# Patient Record
Sex: Male | Born: 1935 | Race: White | Hispanic: No | Marital: Married | Smoking: Former smoker
Health system: Southern US, Community
[De-identification: ages and names within clinical notes are randomized; demographics above are authoritative.]

## PROBLEM LIST (undated history)

## (undated) DIAGNOSIS — I219 Acute myocardial infarction, unspecified: Secondary | ICD-10-CM

## (undated) DIAGNOSIS — C8590 Non-Hodgkin lymphoma, unspecified, unspecified site: Secondary | ICD-10-CM

## (undated) DIAGNOSIS — IMO0001 Reserved for inherently not codable concepts without codable children: Secondary | ICD-10-CM

## (undated) DIAGNOSIS — E7800 Pure hypercholesterolemia, unspecified: Secondary | ICD-10-CM

## (undated) DIAGNOSIS — E785 Hyperlipidemia, unspecified: Secondary | ICD-10-CM

## (undated) DIAGNOSIS — I2510 Atherosclerotic heart disease of native coronary artery without angina pectoris: Secondary | ICD-10-CM

## (undated) DIAGNOSIS — C44300 Unspecified malignant neoplasm of skin of unspecified part of face: Secondary | ICD-10-CM

## (undated) HISTORY — DX: Atherosclerotic heart disease of native coronary artery without angina pectoris: I25.10

## (undated) HISTORY — DX: Pure hypercholesterolemia, unspecified: E78.00

## (undated) HISTORY — PX: HERNIA REPAIR: SHX51

## (undated) HISTORY — DX: Reserved for inherently not codable concepts without codable children: IMO0001

## (undated) HISTORY — DX: Hyperlipidemia, unspecified: E78.5

## (undated) HISTORY — DX: Non-Hodgkin lymphoma, unspecified, unspecified site: C85.90

---

## 1998-10-24 ENCOUNTER — Other Ambulatory Visit: Admission: RE | Admit: 1998-10-24 | Discharge: 1998-10-24 | Payer: Self-pay | Admitting: Otolaryngology

## 1998-11-23 ENCOUNTER — Ambulatory Visit (HOSPITAL_BASED_OUTPATIENT_CLINIC_OR_DEPARTMENT_OTHER): Admission: RE | Admit: 1998-11-23 | Discharge: 1998-11-23 | Payer: Self-pay | Admitting: Otolaryngology

## 1998-12-29 ENCOUNTER — Other Ambulatory Visit: Admission: RE | Admit: 1998-12-29 | Discharge: 1998-12-29 | Payer: Self-pay | Admitting: Oncology

## 1999-01-11 ENCOUNTER — Ambulatory Visit (HOSPITAL_COMMUNITY): Admission: RE | Admit: 1999-01-11 | Discharge: 1999-01-11 | Payer: Self-pay | Admitting: Oncology

## 1999-01-11 ENCOUNTER — Encounter: Payer: Self-pay | Admitting: Oncology

## 1999-04-03 ENCOUNTER — Encounter: Payer: Self-pay | Admitting: Oncology

## 1999-04-03 ENCOUNTER — Encounter: Admission: RE | Admit: 1999-04-03 | Discharge: 1999-04-03 | Payer: Self-pay | Admitting: Oncology

## 1999-05-18 ENCOUNTER — Encounter: Admission: RE | Admit: 1999-05-18 | Discharge: 1999-05-18 | Payer: Self-pay | Admitting: Oncology

## 1999-05-18 ENCOUNTER — Encounter: Payer: Self-pay | Admitting: Oncology

## 2000-09-16 ENCOUNTER — Encounter (INDEPENDENT_AMBULATORY_CARE_PROVIDER_SITE_OTHER): Payer: Self-pay | Admitting: Specialist

## 2000-09-16 ENCOUNTER — Ambulatory Visit (HOSPITAL_BASED_OUTPATIENT_CLINIC_OR_DEPARTMENT_OTHER): Admission: RE | Admit: 2000-09-16 | Discharge: 2000-09-16 | Payer: Self-pay | Admitting: Otolaryngology

## 2000-09-23 ENCOUNTER — Encounter: Admission: RE | Admit: 2000-09-23 | Discharge: 2000-09-23 | Payer: Self-pay | Admitting: Oncology

## 2000-09-23 ENCOUNTER — Encounter: Payer: Self-pay | Admitting: Oncology

## 2000-12-01 ENCOUNTER — Encounter: Payer: Self-pay | Admitting: Oncology

## 2000-12-01 ENCOUNTER — Ambulatory Visit (HOSPITAL_COMMUNITY): Admission: RE | Admit: 2000-12-01 | Discharge: 2000-12-01 | Payer: Self-pay | Admitting: Oncology

## 2000-12-08 ENCOUNTER — Encounter: Payer: Self-pay | Admitting: Oncology

## 2000-12-08 ENCOUNTER — Ambulatory Visit (HOSPITAL_COMMUNITY): Admission: RE | Admit: 2000-12-08 | Discharge: 2000-12-08 | Payer: Self-pay | Admitting: Oncology

## 2001-01-26 ENCOUNTER — Encounter: Payer: Self-pay | Admitting: Oncology

## 2001-01-26 ENCOUNTER — Encounter: Admission: RE | Admit: 2001-01-26 | Discharge: 2001-01-26 | Payer: Self-pay | Admitting: Oncology

## 2001-01-30 ENCOUNTER — Ambulatory Visit (HOSPITAL_COMMUNITY): Admission: RE | Admit: 2001-01-30 | Discharge: 2001-01-30 | Payer: Self-pay | Admitting: Oncology

## 2001-01-30 ENCOUNTER — Encounter: Payer: Self-pay | Admitting: Oncology

## 2001-03-30 ENCOUNTER — Encounter: Payer: Self-pay | Admitting: Oncology

## 2001-03-30 ENCOUNTER — Encounter: Admission: RE | Admit: 2001-03-30 | Discharge: 2001-03-30 | Payer: Self-pay | Admitting: Oncology

## 2001-04-10 ENCOUNTER — Encounter: Payer: Self-pay | Admitting: Hematology and Oncology

## 2001-04-10 ENCOUNTER — Ambulatory Visit (HOSPITAL_COMMUNITY): Admission: RE | Admit: 2001-04-10 | Discharge: 2001-04-10 | Payer: Self-pay | Admitting: Hematology and Oncology

## 2001-05-13 DIAGNOSIS — C8590 Non-Hodgkin lymphoma, unspecified, unspecified site: Secondary | ICD-10-CM

## 2001-05-13 HISTORY — DX: Non-Hodgkin lymphoma, unspecified, unspecified site: C85.90

## 2001-11-23 ENCOUNTER — Encounter: Payer: Self-pay | Admitting: Cardiology

## 2001-11-23 ENCOUNTER — Inpatient Hospital Stay (HOSPITAL_COMMUNITY): Admission: EM | Admit: 2001-11-23 | Discharge: 2001-11-27 | Payer: Self-pay | Admitting: Emergency Medicine

## 2002-06-30 ENCOUNTER — Ambulatory Visit (HOSPITAL_COMMUNITY): Admission: RE | Admit: 2002-06-30 | Discharge: 2002-06-30 | Payer: Self-pay | Admitting: Cardiology

## 2002-12-31 ENCOUNTER — Encounter: Payer: Self-pay | Admitting: Oncology

## 2002-12-31 ENCOUNTER — Ambulatory Visit (HOSPITAL_COMMUNITY): Admission: RE | Admit: 2002-12-31 | Discharge: 2002-12-31 | Payer: Self-pay | Admitting: Oncology

## 2004-04-19 ENCOUNTER — Ambulatory Visit: Payer: Self-pay | Admitting: Oncology

## 2004-10-29 ENCOUNTER — Ambulatory Visit: Payer: Self-pay | Admitting: Oncology

## 2005-04-29 ENCOUNTER — Ambulatory Visit: Payer: Self-pay | Admitting: Oncology

## 2005-05-13 DIAGNOSIS — I219 Acute myocardial infarction, unspecified: Secondary | ICD-10-CM

## 2005-05-13 DIAGNOSIS — I2510 Atherosclerotic heart disease of native coronary artery without angina pectoris: Secondary | ICD-10-CM

## 2005-05-13 HISTORY — PX: CORONARY ARTERY BYPASS GRAFT: SHX141

## 2005-05-13 HISTORY — DX: Acute myocardial infarction, unspecified: I21.9

## 2005-05-13 HISTORY — DX: Atherosclerotic heart disease of native coronary artery without angina pectoris: I25.10

## 2005-10-25 ENCOUNTER — Ambulatory Visit: Payer: Self-pay | Admitting: Oncology

## 2005-10-29 LAB — CBC WITH DIFFERENTIAL/PLATELET
BASO%: 0.4 % (ref 0.0–2.0)
LYMPH%: 33.2 % (ref 14.0–48.0)
MCHC: 34.5 g/dL (ref 32.0–35.9)
MONO#: 0.5 10*3/uL (ref 0.1–0.9)
MONO%: 8.9 % (ref 0.0–13.0)
NEUT#: 2.9 10*3/uL (ref 1.5–6.5)
Platelets: 189 10*3/uL (ref 145–400)
RBC: 4.99 10*6/uL (ref 4.20–5.71)
RDW: 12.9 % (ref 11.2–14.6)
WBC: 5.3 10*3/uL (ref 4.0–10.0)

## 2005-10-30 LAB — COMPREHENSIVE METABOLIC PANEL
ALT: 11 U/L (ref 0–40)
Albumin: 4.3 g/dL (ref 3.5–5.2)
Alkaline Phosphatase: 42 U/L (ref 39–117)
CO2: 26 mEq/L (ref 19–32)
Potassium: 4.4 mEq/L (ref 3.5–5.3)
Sodium: 139 mEq/L (ref 135–145)
Total Bilirubin: 0.8 mg/dL (ref 0.3–1.2)
Total Protein: 6.4 g/dL (ref 6.0–8.3)

## 2006-04-02 ENCOUNTER — Inpatient Hospital Stay (HOSPITAL_COMMUNITY): Admission: AD | Admit: 2006-04-02 | Discharge: 2006-04-06 | Payer: Self-pay | Admitting: Cardiology

## 2006-04-24 ENCOUNTER — Ambulatory Visit: Payer: Self-pay | Admitting: Oncology

## 2006-04-29 LAB — CBC WITH DIFFERENTIAL/PLATELET
Basophils Absolute: 0 10*3/uL (ref 0.0–0.1)
Eosinophils Absolute: 0.1 10*3/uL (ref 0.0–0.5)
HGB: 12.5 g/dL — ABNORMAL LOW (ref 13.0–17.1)
MCV: 87.9 fL (ref 81.6–98.0)
MONO#: 0.5 10*3/uL (ref 0.1–0.9)
NEUT#: 3.3 10*3/uL (ref 1.5–6.5)
RBC: 4.27 10*6/uL (ref 4.20–5.71)
RDW: 13.6 % (ref 11.2–14.6)
WBC: 5.2 10*3/uL (ref 4.0–10.0)
lymph#: 1.2 10*3/uL (ref 0.9–3.3)

## 2006-04-29 LAB — COMPREHENSIVE METABOLIC PANEL
Albumin: 4.3 g/dL (ref 3.5–5.2)
Alkaline Phosphatase: 80 U/L (ref 39–117)
BUN: 13 mg/dL (ref 6–23)
CO2: 26 mEq/L (ref 19–32)
Calcium: 9 mg/dL (ref 8.4–10.5)
Chloride: 107 mEq/L (ref 96–112)
Glucose, Bld: 68 mg/dL — ABNORMAL LOW (ref 70–99)
Potassium: 4.2 mEq/L (ref 3.5–5.3)
Sodium: 141 mEq/L (ref 135–145)
Total Protein: 6.6 g/dL (ref 6.0–8.3)

## 2006-10-27 ENCOUNTER — Ambulatory Visit: Payer: Self-pay | Admitting: Oncology

## 2006-10-28 LAB — CBC WITH DIFFERENTIAL/PLATELET
Basophils Absolute: 0 10*3/uL (ref 0.0–0.1)
EOS%: 1.8 % (ref 0.0–7.0)
HCT: 41.8 % (ref 38.7–49.9)
HGB: 14.8 g/dL (ref 13.0–17.1)
LYMPH%: 28.9 % (ref 14.0–48.0)
MCH: 30.6 pg (ref 28.0–33.4)
MCV: 86.3 fL (ref 81.6–98.0)
MONO%: 6 % (ref 0.0–13.0)
NEUT%: 62.9 % (ref 40.0–75.0)

## 2006-10-28 LAB — COMPREHENSIVE METABOLIC PANEL
AST: 17 U/L (ref 0–37)
Alkaline Phosphatase: 46 U/L (ref 39–117)
BUN: 16 mg/dL (ref 6–23)
Calcium: 8.9 mg/dL (ref 8.4–10.5)
Creatinine, Ser: 1 mg/dL (ref 0.40–1.50)
Total Bilirubin: 0.8 mg/dL (ref 0.3–1.2)

## 2007-04-24 ENCOUNTER — Ambulatory Visit: Payer: Self-pay | Admitting: Oncology

## 2007-04-28 LAB — COMPREHENSIVE METABOLIC PANEL
Albumin: 4.4 g/dL (ref 3.5–5.2)
Alkaline Phosphatase: 45 U/L (ref 39–117)
BUN: 22 mg/dL (ref 6–23)
Calcium: 9 mg/dL (ref 8.4–10.5)
Chloride: 107 mEq/L (ref 96–112)
Glucose, Bld: 101 mg/dL — ABNORMAL HIGH (ref 70–99)
Potassium: 4.3 mEq/L (ref 3.5–5.3)

## 2007-04-28 LAB — LACTATE DEHYDROGENASE: LDH: 107 U/L (ref 94–250)

## 2007-04-28 LAB — CBC WITH DIFFERENTIAL/PLATELET
Basophils Absolute: 0 10*3/uL (ref 0.0–0.1)
Eosinophils Absolute: 0.2 10*3/uL (ref 0.0–0.5)
HGB: 15.1 g/dL (ref 13.0–17.1)
MCV: 88.1 fL (ref 81.6–98.0)
MONO%: 5.5 % (ref 0.0–13.0)
NEUT#: 6.6 10*3/uL — ABNORMAL HIGH (ref 1.5–6.5)
Platelets: 212 10*3/uL (ref 145–400)
RDW: 12.9 % (ref 11.2–14.6)

## 2007-07-28 ENCOUNTER — Ambulatory Visit: Payer: Self-pay | Admitting: Oncology

## 2007-10-22 ENCOUNTER — Ambulatory Visit: Payer: Self-pay | Admitting: Oncology

## 2008-07-22 ENCOUNTER — Ambulatory Visit: Payer: Self-pay | Admitting: Oncology

## 2009-04-27 ENCOUNTER — Ambulatory Visit: Payer: Self-pay | Admitting: Oncology

## 2010-01-03 ENCOUNTER — Ambulatory Visit: Payer: Self-pay | Admitting: Cardiology

## 2010-06-07 ENCOUNTER — Ambulatory Visit: Payer: Self-pay | Admitting: Oncology

## 2010-07-09 ENCOUNTER — Ambulatory Visit (INDEPENDENT_AMBULATORY_CARE_PROVIDER_SITE_OTHER): Payer: Medicare Other | Admitting: Cardiology

## 2010-07-09 DIAGNOSIS — I2510 Atherosclerotic heart disease of native coronary artery without angina pectoris: Secondary | ICD-10-CM

## 2010-07-10 ENCOUNTER — Encounter (HOSPITAL_BASED_OUTPATIENT_CLINIC_OR_DEPARTMENT_OTHER): Payer: Medicare Other | Admitting: Oncology

## 2010-07-10 DIAGNOSIS — C8589 Other specified types of non-Hodgkin lymphoma, extranodal and solid organ sites: Secondary | ICD-10-CM

## 2010-07-12 DIAGNOSIS — IMO0001 Reserved for inherently not codable concepts without codable children: Secondary | ICD-10-CM

## 2010-07-12 HISTORY — DX: Reserved for inherently not codable concepts without codable children: IMO0001

## 2010-07-18 ENCOUNTER — Telehealth (INDEPENDENT_AMBULATORY_CARE_PROVIDER_SITE_OTHER): Payer: Self-pay | Admitting: Radiology

## 2010-07-19 ENCOUNTER — Encounter: Payer: Self-pay | Admitting: Cardiology

## 2010-07-19 ENCOUNTER — Ambulatory Visit (HOSPITAL_COMMUNITY): Payer: Medicare Other | Attending: Cardiology

## 2010-07-19 DIAGNOSIS — I25810 Atherosclerosis of coronary artery bypass graft(s) without angina pectoris: Secondary | ICD-10-CM

## 2010-07-19 DIAGNOSIS — I2510 Atherosclerotic heart disease of native coronary artery without angina pectoris: Secondary | ICD-10-CM

## 2010-07-24 NOTE — Assessment & Plan Note (Addendum)
Summary: Cardiology Nuclear Testing  Nuclear Med Background Indications for Stress Test: Evaluation for Ischemia, Graft Patency, PTCA Patency   History: Angioplasty, CABG, Heart Catheterization, Myocardial Infarction, Myocardial Perfusion Study  History Comments: '03 MI - IWMI / PTCA-RCA / '07 Cath severe LM >>Cabg x 3 / 2/10 MPS- EF=55% no ischem.  Symptoms: DOE    Nuclear Pre-Procedure Cardiac Risk Factors: Hypertension, Lipids Caffeine/Decaff Intake: None NPO After: 7:00 PM Lungs: clear IV 0.9% NS with Angio Cath: 20g     IV Site: R Hand IV Started by: Stanton Kidney, EMT-P Chest Size (in) 44     Height (in): 70 Weight (lb): 190 BMI: 27.36  Nuclear Med Study 1 or 2 day study:  1 day     Stress Test Type:  Treadmill/Lexiscan Reading MD:  Willa Rough, MD     Referring MD:  S.Tennant Resting Radionuclide:  Technetium 72m Tetrofosmin     Resting Radionuclide Dose:  11 mCi  Stress Radionuclide:  Technetium 24m Tetrofosmin     Stress Radionuclide Dose:  33 mCi   Stress Protocol  Max Systolic BP: 127 mm Hg Lexiscan: 0.4 mg   Stress Test Technologist:  Milana Na, EMT-P     Nuclear Technologist:  Domenic Polite, CNMT  Rest Procedure  Myocardial perfusion imaging was performed at rest 45 minutes following the intravenous administration of Technetium 108m Tetrofosmin.  Stress Procedure  The patient received IV Lexiscan 0.4 mg over 15-seconds with concurrent low level exercise and then Technetium 82m Tetrofosmin was injected at 30-seconds while the patient continued walking one more minute.  There were no significant changes with Lexiscan.  Quantitative spect images were obtained after a 45 minute delay.  QPS Raw Data Images:  Patient motion noted; appropriate software correction applied. Stress Images:  mild decreased activity at the base of the inferior wall.Marland Kitchen Rest Images:  same as stress Subtraction (SDS):  No evidence of ischemia. Transient Ischemic Dilatation:  1.08   (Normal <1.22)  Lung/Heart Ratio:  .33  (Normal <0.45)  Quantitative Gated Spect Images QGS EDV:  96 ml QGS ESV:  43 ml QGS EF:  55 % QGS cine images:  Mild hypokinesis of the septum and the base of the inferior wall.  Findings Low risk nuclear study      Overall Impression  Exercise Capacity: Lexiscan with no exercise. BP Response: Normal blood pressure response. Clinical Symptoms: chest pressure ECG Impression: No significant ST segment change suggestive of ischemia. Overall Impression Comments: There is mild scar at the base of the inferior wall. There is no ischemia.

## 2010-07-24 NOTE — Progress Notes (Signed)
Summary: Nuclear Pre-Procedure  Phone Note Outgoing Call Call back at [facility] Phone [phone]   Call placed by: Stanton Kidney, EMT-P,  July 18, 2010 11:07 AM Call placed to: Patient Action Taken: Phone Call Completed Reason for Call: Confirm/change Appt Summary of Call: Left message with information on [employer] Information Sheet (see scanned document for details). Stanton Kidney, EMT-P  July 18, 2010 11:08 AM      Nuclear Med Background Indications for Stress Test: Evaluation for Ischemia, Graft Patency, PTCA Patency   History: Angioplasty, CABG, Heart Catheterization, Myocardial Infarction, Myocardial Perfusion Study  History Comments: '03 MI - IWMI / PTCA-RCA / '07 Cath severe LM >>Cabg x 3 / 2/10 MPS- EF=55% no ischem.     Nuclear Pre-Procedure Cardiac Risk Factors: Hypertension, Lipids

## 2010-09-28 NOTE — Discharge Summary (Signed)
NAME:  MAYCO, WALROND NO.:  [id number]   MEDICAL RECORD NO.:  [id number]          PATIENT TYPE:  INP   LOCATION:  2012                         FACILITY:  [facility]   PHYSICIAN:  Salvatore Decent. 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   IN-HOSPITAL DIAGNOSIS:  Acute blood loss anemia postoperatively.   SECONDARY DIAGNOSES:  1. Known atherosclerotic cardiovascular disease, with history of      previous inferior myocardial infarction, with angioplasty to distal      right coronary in 2003.  2. Hypertension.  3. Hyperlipidemia.  4. History of non-Hodgkin's lymphoma.  The patient is currently in      remission and has been treated with chemotherapy.  He denies      radiation.  Followed by Dr. Jillyn Hidden B. Truett Perna, M.D.  __________.   IN-HOSPITAL OPERATIONS AND PROCEDURES:  1. Cardiac catheterization.  2. Emergent coronary artery bypass grafting x3, using a left internal      mammary artery to left anterior descending, saphenous vein graft      sequentially to first diagonal and obtuse marginal 2.  Endoscopic      vein harvesting from right thigh.   HISTORY AND PHYSICAL AND HOSPITAL COURSE:  Mr. Neville is a 75 year old  gentleman who was undergoing cardiac catheterization by Dr. Deborah Chalk on  April 02, 2006.  At that time, the patient was noted to have a  heavily calcified left main, with a critical left main stenosis and  likely dissection in the vessel.  Note that this did not appear to be  catheter induced.  At that time, Dr. Dorris Fetch was consulted.  Dr.  Dorris Fetch discussed with the patient and family undergoing surgery  emergent coronary artery bypass grafting.  He discussed the risks and  benefits with the patient.  The patient acknowledged understanding and  agreed to proceed.  He does have a history of hypertension,  hyperlipidemia, and non-Hodgkin's lymphoma.  For details of the  patient's past medical history and physical exam, please see dictated  history and physical.   The patient was taken emergently to the operating room on April 02, 2006, where he underwent emergent coronary artery bypass grafting x3  using a left internal mammary artery to left anterior descending,  saphenous vein graft sequentially to first diagonal and obtuse marginal  2.  Endoscopic vein harvesting from the right thigh was done.  The  patient tolerated his procedure well and was transferred to the  intensive care unit in stable condition.  Following surgery, the patient  was noted to be hemodynamically stable, with hematocrit 28%.  The  patient's postoperative course was pretty much unremarkable.  On postop  day 1, the patient's hemoglobin and hematocrit remained stable at 11.6  and 33%.  Creatinine was stable at 0.8.  He was saturating 100% on room  air.  Vitals were stable.  He was able to be weaned off all drips,  and  chest tubes and lines were discontinued.  The patient was out of bed  ambulating.  By postop day 2, he remained stable.  He was transferred  out to 2000.  The patient remained in normal sinus rhythm  postoperatively.  Respiratory remained stable.  Incisions were clean,  dry, and intact, and healing well.  Vital signs were monitored closely  and seemed to be stable.  He remained afebrile.  The patient was able to  remain off oxygen saturating greater than 90% on room air.  He did  develop slight acute blood loss anemia.  Hemoglobin and hematocrit  dropped to 9.8 and 28%.  The patient was asymptomatic and has remained  stable.  He was out of bed ambulating well.  He was tolerating a regular  diet with no nausea or vomiting noted.  Labs on postop day 4 showed a  white count of 8.9, hemoglobin 9.9, hematocrit 27.9, glucose of 167.  Sodium 140,  potassium 4.4, chloride 105, Bicarbonate 32, BUN 13,  creatinine 0.9, glucose 98.  The patient's weight was monitored  postoperatively and seemed to be back near baseline prior to discharge  home.   The patient was discharged home on postop day 5, April 06, 2006, in  stable condition.   FOLLOW-UP APPOINTMENTS:  A follow-up appointment was to be arranged  through our office to follow up with Dr. Dorris Fetch in 3 weeks.  The  patient was to contact Dr. Ronnald Nian office to follow up with him in 2  weeks.   ACTIVITY:  The patient was instructed on no driving until released to do  so.  No heaving lifting of greater than 10 pounds.  Told to ambulate 3-4  times per day, progress as tolerated, and continue his breathing  exercises.   INCISIONAL CARE:  The patient was told to shower, washing his incisions  using soap and water.  He is to contact the office if he develops any  drainage or opening from any of his incision sites.   DIET:  The patient was educated on diet to be low fat, low salt.   DISCHARGE MEDICATIONS:  1. Aspirin 325 mg daily.  2. Lopressor 25 mg t.i.d.  3. Lipitor 40 mg at night.  4. Multivitamin daily.  5. Oxycodone 5 mg 1-2 tabs q.4-6 h. p.r.n. pain.      Theda Belfast, PA    ______________________________  Salvatore Decent Dorris Fetch, M.D.    KMD/MEDQ  D:  05/27/2006  T:  05/28/2006  Job:  [id number]   cc:   Colleen Can. Deborah Chalk, M.D.

## 2010-09-28 NOTE — Cardiovascular Report (Signed)
NAME:  David Little, CASTO NO.:  [id number]   MEDICAL RECORD NO.:  [id number]          PATIENT TYPE:  OIB   LOCATION:  2899                         FACILITY:  [facility]   PHYSICIAN:  Colleen Can. Deborah Chalk, M.D.DATE OF BIRTH:  08-04-1935   DATE OF PROCEDURE:  04/02/2006  DATE OF DISCHARGE:                            CARDIAC CATHETERIZATION   HISTORY:  Deniece Portela had an inferior myocardial infarction, treated with  angioplasty in July 2003.  The right coronary artery had a high anterior  takeoff, was relatively small and was treated with balloon angioplasty.  He has had a history of non-Hodgkin's lymphoma treated with chemotherapy  but with no radiation.  He is referred now for catheterization because  of 44-month history of progressive angina.   PROCEDURE:  Left heart catheterization with selective coronary  angiography, left ventricular angiography.   TYPE AND SITE OF ENTRY:  Percutaneous right femoral artery.   CATHETERS:  6-French 4-curve Judkins right and left coronary catheter, 6-  French pigtail ventriculographic catheter.  A no-torque right coronary  catheter.   CONTRAST MATERIAL:  Omnipaque.   MEDICATIONS GIVEN PRIOR TO PROCEDURE:  Valium 10 mg p.o.   MEDICATIONS GIVEN DURING PROCEDURE:  Versed 2 mg IV.   COMMENTS:  The patient tolerated the procedure well.   HEMODYNAMIC DATA:  The aortic pressure was 106/54.  LV is 106 over 4-10.  There is no aortic valve gradient noted on pullback.   ANGIOGRAPHIC DATA:  1. Left main coronary artery has a dissection around a heavily      calcified area.  There could be a 90% distal left main, but it is      hazy, and dissection is present with what would appear to be      probably a double lumen.  There is not staining of contrast, but      there is a large area of lack of opacity and a very tight 90-plus      percent stenosis at the origin of the left circumflex and left      anterior descending coronaries.  2. Left anterior  descending.  The left anterior descending has      calcification as well as irregularities.  The diagonal vessel has      two 70-80% focal stenoses present.  The left anterior descending is      a long vessel that wraps around the apex.  3. Left circumflex.  The left circumflex has a large bifurcating      marginal vessel and a posterolateral branch continuing near the AV      groove.  There are irregularities but no greater than 30-40% in      these vessels.  4. Right coronary artery.  His right coronary artery is a small      vessel.  It has a high anterior origin but it does have a posterior      descending vessel.  It has irregularities but no focal narrowing.  5. The left ventricular angiogram was performed in the RAO position.      Overall cardiac size and silhouette are normal.  The  global      ejection fraction would be estimated to be 55-60%.  Regional wall      motion is normal.   OVERALL IMPRESSION:  1. Complex severe stenosis in the left main coronary artery with      probable spontaneous dissection in the left main.  2. Severe stenosis at the origin of the left anterior descending and      left circumflex as part of the complex dissection plaque in the      left main.  3. Irregularities in the left anterior descending with severe stenosis      in the diagonal branch.  4. Mild to moderate stenosis in the left circumflex.  5. Small right coronary artery with minimal atherosclerosis.  6. Essentially normal left ventricular global function.   PLAN:  The patient will be taken to the operating room for emergency  coronary artery bypass grafting.  The extreme risk of the situation was  noted to the patient and to his family, and he is willing proceed on  with coronary artery bypass grafting.  I do not think there is another  satisfactory therapeutic option for him at this point in time.      Colleen Can. Deborah Chalk, M.D.  Electronically Signed     SNT/MEDQ  D:  04/02/2006   T:  04/02/2006  Job:  [id number]

## 2010-09-28 NOTE — Op Note (Signed)
NAME:  David Little, David Little NO.:  [id number]   MEDICAL RECORD NO.:  [id number]          PATIENT TYPE:  INP   LOCATION:  2315                         FACILITY:  [facility]   PHYSICIAN:  Salvatore Decent. 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   PROCEDURE:  Emergency median sternotomy, extracorporeal circulation, and  coronary bypass grafting x3 (left internal mammary artery to LAD, saphenous  vein graft sequentially to the first diagonal and obtuse marginal 2),  endoscopic vein harvest right thigh.   SURGEON:  Salvatore Decent. Dorris Fetch, M.D.   ASSISTANT:  Jerold Coombe, P.A.   ANESTHESIA:  General.   FINDINGS:  Diagonal fair-quality target.  LAD with diffuse lateral and  posterior plaquing but good-quality vessel at site of the anastomosis.  OM2  good-quality vessel, good-quality conduits.   CLINICAL NOTE:  David Little is a 75 year old gentleman who was undergoing  off cardiac catheterization by Dr. Roger Shelter today, when he was noted  to have a heavily calcified left main, with a critical left main stenosis  and likely dissection in the vessel.  This did not appear to be catheter  induced.  The patient was advised to undergo emergent coronary bypass  grafting.  The indications, benefits and alternatives were discussed in  detail with the patient.  He understood and accepted the risk and agreed to  proceed.   OPERATIVE NOTE:  Mr. Renbarger was brought from the catheterization laboratory  to the operating room directly.  There, the anesthesia service placed lines  for monitoring arterial, central venous, and pulmonary arterial pressure.  Intravenous antibiotics were administered.  He  was anesthetized and  intubated.  A Foley catheter was placed.  The chest, abdomen, and legs were  prepped and draped in the usual fashion.   A median sternotomy was performed, and the left internal mammary artery was  harvested using standard technique.  Simultaneously, an incision was made in  the medial aspect of the right leg at the level of the knee, and the greater  saphenous vein was harvested from the leg. An additional incision needed to  be made to be made in the mid-thigh because of complex branching anatomy.  The vein was very small at the knee, and the lower thigh segment of the vein  was not suitable for use as a graft.  The upper thigh vein was good quality.  Five thousand units of heparin was administered during the vessel harvest.  The remainder of the full heparin dose was given prior to opening the  pericardium.   The pericardium was opened.  The ascending aorta was inspected.  There was  no evidence of atherosclerotic disease.  The aorta was cannulated via  concentric 2-0 Ethilon pledgeted pursestring sutures.  A dual-stage venous  cannula placed via pursestring suture in the right atrial appendage.  After  confirming adequate anticoagulation with ACT measurement, cardiopulmonary  bypass was instituted, and flows were maintained per protocol throughout the  bypass.  The patient was cooled to 32 degrees Celsius.  The coronary  arteries were inspected, and anastomotic sites were chosen.  The conduits  were inspected and cut to length. A foam pad was placed in the pericardium  to protect the left phrenic nerve.  A temperature probe was placed in the  myocardial septum, and a cardioplegia cannula was placed in the ascending  aorta.   The aorta was cross-clamped.  The left ventricle was emptied via the aortic  root vent.  Cardiac arrest then was achieved with a combination of cold  antegrade blood cardioplegia and topical iced saline.  Of note, the  retrograde  cardioplegia was not administered because the patient was not  having any signs of ischemia prior to or during nearly portion of the  operation.  After achieving a complete diastolic arrest and adequate  myocardial septal cooling, the following distal anastomoses were performed.   First, a reverse saphenous vein grafts placed sequentially to the first  diagonal branch of the LAD and the second obtuse marginal branch of the left  circumflex.  The first diagonal had about a 70% proximal stenosis.  It was a  1.5-mm fair-quality target.  A side-to-side anastomosis was performed off a  side branch of the vein graft to the diagonal.  All anastomoses were probed  proximally and distally at their completion to ensure patency, and there was  excellent flow through this anastomosis as well.  The distal end of the vein  graft then was cut to length and was anastomosed end-to-side to the obtuse  marginal 2.  There was no significant disease in the circumflex system  itself.  Obtuse marginal 2 was slightly larger than obtuse marginal 1, and  therefore was selected for grafting.  This was a 2 mm good-quality target.  At the completion of the distal anastomosis, cardioplegia was administered.  There was good flow and good hemostasis.   Next, the left internal mammary artery was brought through a window in the  pericardium.  The distal end was beveled.  It was anastomosed end-to-side to  the distal LAD.  The LAD had diffuse posterolateral plaquing throughout its  course.  It was a 2 mm vessel.  A 1.5 mm probe passed easily throughout the  length of the LAD.  The mammary was anastomosed end-to-side with a running 8-  0 Prolene suture. At the completion of the mammary-to-LAD anastomosis, the  bulldog clamp was briefly removed to inspect for hemostasis.  Immediate  rapid septal rewarming was noted.  The bulldog clamp was replaced.  The  mammary pedicle was tacked to the epicardial surface of the heart with  6-0 Prolene sutures.   Additional cardioplegia was administered.  The vein graft was cut to length.  The cardioplegic cannula was removed from the ascending aorta, and the  proximal vein graft anastomosis was performed under cross-clamp to a 4.5 mm  punch aortotomy with a running 6-0 Prolene suture. At the completion of the  final proximal anastomosis, the patient was placed in Trendelenburg  position.  Lidocaine was administered.  The bulldog clamp was again removed  from mammary artery, and immediate rapid septal rewarming was again noted.  The aortic root was de-aired, and the aortic cross-clamp was removed.  The  total cross-clamp time was 46 minutes.  The patient required a single  defibrillation with 20 joules  and thereafter was in sinus bradycardic  rhythm.   The proximal and distal anastomoses were inspect for hemostasis while the  patient was being rewarmed.  Epicardial pacing wires were placed on the  right ventricle and right atrium.  When the patient rewarmed to a core  temperature of 37 degrees Celsius, he was weaned from cardiopulmonary bypass  on the first attempt.  He was paced but on no inotropic support at the time  of separation from bypass.  The total bypass time was 68 minutes.   A test dose of protamine was administered and was well tolerated.  The  atrial and aortic cannulae were removed.  The remainder of the protamine was  administered without incident.  The chest was irrigated with 1 liter of warm  normal saline containing 1 g of vancomycin.  The pericardium was  reapproximated with interrupted 3-0 silk sutures.  It came together easily  without tension.  A left pleural and two mediastinal chest tubes placed  through separate subcostal incisions.  The sternum was closed with  interrupted heavy gauge stainless steel wires.  The remainder of  the incisions were closed standard fashion.  All sponge, needle, and  instrument counts were correct at the end of the  procedure.  There were no  intraoperative complications.  The patient was taken from the operating room  to the postanesthetic care unit in good condition.           ______________________________  Salvatore Decent Dorris Fetch, M.D.     SCH/MEDQ  D:  04/02/2006  T:  04/03/2006  Job:  [id number]   cc:   Colleen Can. Deborah Chalk, M.D.

## 2010-09-28 NOTE — H&P (Signed)
NAME:  David Little, David Little NO.:  [id number]   MEDICAL RECORD NO.:  [id number]          PATIENT TYPE:  OIB   LOCATION:  2899                         FACILITY:  [facility]   PHYSICIAN:  Colleen Can. Deborah Chalk, M.D.DATE OF BIRTH:  02/17/1936   DATE OF ADMISSION:  04/02/2006  DATE OF DISCHARGE:                              HISTORY & PHYSICAL   CHIEF COMPLAINT:  Chest pain.   HISTORY OF PRESENT ILLNESS:  David Little is a very pleasant 75 year old white  male who has a known history of ischemic heart disease.  He has been  managed medically since his catheterization in February 2004.  He has a  remote history of inferior myocardial infarction with angioplasty to the  right coronary that dates back to 2003.  He has been off of Plavix.  He  presents to our office as a work-in appointment on April 01, 2006,  with complaints of exertional chest pain that has been occurring over  the past month.  It is seemingly getting worse.  It is primarily with  exertion such as walking up stairs or walking up a hill.  It resolves  very easily with rest.  He has not used nitroglycerin but he has had  some associated shortness of breath.  He denies any symptoms at rest.  He was subsequently seen in the office and is now referred for repeat  cardiac catheterization.   PAST MEDICAL HISTORY:  1. Known atherosclerotic cardiovascular disease.  He has a history of      a previous inferior MI with angioplasty to the distal right      coronary in 2003.  He had his last catheterization in February 2004      and has been managed medically since that time.  He had a      reasonably well-preserved LV function with mild inferobasal      hypokinesia, patency of the angioplasty site in the right coronary      with residual 30-40% narrowing, 80% segmental and calcified      stenosis of the proximal diagonal vessel that is 2-2.5 mm in      diameter, and a 50% narrowing in an obtuse marginal branch.  His      left  main coronary was normal.  2. Hypertension.  3. Hyperlipidemia.  4. History of non-Hodgkin's lymphoma.  He is currently in remission      and has been treated with chemotherapy.  He denies a history of      radiation.  He sees Dr. Mancel Bale twice a year.   ALLERGIES:  None.   CURRENT MEDICATIONS:  1. Multivitamin daily.  2. Lipitor 10 mg a day.  3. Altace 2.5 mg daily.  4. Aspirin daily.   FAMILY HISTORY:  He had a brother who died at age 23 of sudden cardiac  death.   SOCIAL HISTORY:  He is married.  He works at [employer].  He  does not smoke.  He only has social alcohol use.   REVIEW OF SYSTEMS:  As noted above and is otherwise unremarkable.   PHYSICAL EXAMINATION:  GENERAL:  He is a pleasant, middle-aged white  male in no acute distress.  He is currently pain-free.  VITAL SIGNS:  Blood pressure is 120/70 sitting, 110/68 standing.  Heart  rate is 86, respirations 18, he is afebrile.  SKIN:  Warm and dry.  Color is unremarkable.  LUNGS:  Clear.  CARDIAC:  Heart shows a regular rate and rhythm.  ABDOMEN:  Obese.  It is soft, positive bowel sounds, nontender.  EXTREMITIES:  Without edema.  NEUROLOGIC:  No gross focal deficits.   His EKG shows normal sinus rhythm.  There are no acute changes.   OVERALL IMPRESSION:  1. A one-month history of exertional chest pain.  2. Known history of ischemic heart disease with remote inferior      myocardial infarction with previous angioplasty to the right      coronary that dates back to 2003.  3. Hypertension, well-controlled.  4. Hyperlipidemia, on Lipitor.   PLAN:  We will proceed on with cardiac catheterization.  The procedure  risks and benefits have all been explained per Dr. Roger Shelter, and  the patient is willing to proceed on Wednesday, April 02, 2006.      Sharlee Blew, N.P.      Colleen Can. Deborah Chalk, M.D.  Electronically Signed    LC/MEDQ  D:  04/01/2006  T:  04/02/2006  Job:  [id number]    cc:   Leighton Roach. Truett Perna, M.D.

## 2010-12-26 ENCOUNTER — Encounter: Payer: Self-pay | Admitting: Nurse Practitioner

## 2011-01-07 ENCOUNTER — Ambulatory Visit (INDEPENDENT_AMBULATORY_CARE_PROVIDER_SITE_OTHER): Payer: Medicare Other | Admitting: Nurse Practitioner

## 2011-01-07 ENCOUNTER — Encounter: Payer: Self-pay | Admitting: Nurse Practitioner

## 2011-01-07 VITALS — BP 102/72 | HR 78 | Ht 69.0 in | Wt 189.0 lb

## 2011-01-07 DIAGNOSIS — E785 Hyperlipidemia, unspecified: Secondary | ICD-10-CM

## 2011-01-07 DIAGNOSIS — I2510 Atherosclerotic heart disease of native coronary artery without angina pectoris: Secondary | ICD-10-CM | POA: Insufficient documentation

## 2011-01-07 LAB — LIPID PANEL
Cholesterol: 158 mg/dL (ref 0–200)
HDL: 49.2 mg/dL (ref >39.00)
LDL Cholesterol: 89 mg/dL (ref 0–99)
Total CHOL/HDL Ratio: 3
Triglycerides: 97 mg/dL (ref 0.0–149.0)
VLDL: 19.4 mg/dL (ref 0.0–40.0)

## 2011-01-07 LAB — BASIC METABOLIC PANEL
BUN: 18 mg/dL (ref 6–23)
CO2: 27 mEq/L (ref 19–32)
Calcium: 9.1 mg/dL (ref 8.4–10.5)
Chloride: 107 mEq/L (ref 96–112)
Creatinine, Ser: 1.1 mg/dL (ref 0.4–1.5)
GFR: 72.06 mL/min (ref >60.00)
Glucose, Bld: 104 mg/dL — ABNORMAL HIGH (ref 70–99)
Potassium: 5.4 mEq/L — ABNORMAL HIGH (ref 3.5–5.1)
Sodium: 142 mEq/L (ref 135–145)

## 2011-01-07 LAB — HEPATIC FUNCTION PANEL
ALT: 17 U/L (ref 0–53)
AST: 22 U/L (ref 0–37)
Albumin: 4.5 g/dL (ref 3.5–5.2)
Alkaline Phosphatase: 44 U/L (ref 39–117)
Bilirubin, Direct: 0.2 mg/dL (ref 0.0–0.3)
Total Bilirubin: 0.7 mg/dL (ref 0.3–1.2)
Total Protein: 6.6 g/dL (ref 6.0–8.3)

## 2011-01-07 NOTE — Progress Notes (Signed)
    David Little Date of Birth: March 20, 1936   History of Present Illness: David Little is seen back today for his 6 month check. He is doing well. No complaints. He is remaining active. He is enjoying retirement. He is tolerating his medicines. Last nuclear was this past March and was satisfactory.   Current Outpatient Prescriptions on File Prior to Visit  Medication Sig Dispense Refill  . aspirin 325 MG tablet Take 325 mg by mouth daily.        . metoprolol tartrate (LOPRESSOR) 25 MG tablet Take 25 mg by mouth 2 (two) times daily.        . Multiple Vitamin (MULTIVITAMIN PO) Take by mouth daily.        . nitroGLYCERIN (NITROSTAT) 0.4 MG SL tablet Place 0.4 mg under the tongue as needed.        . simvastatin (ZOCOR) 40 MG tablet Take 40 mg by mouth daily.          No Known Allergies  Past Medical History  Diagnosis Date  . Lymphoma 2003    without recurrence  . Hyperlipidemia   . Hypercholesterolemia   . Coronary artery disease     s/p CABG in 2007 for seere left main disease  . Normal nuclear stress test March 2012    EF 55%. No ischemia. Mild inferior scar    Past Surgical History  Procedure Date  . Coronary artery bypass graft 2007    Had coronary artery bypass grafting for severe left main coronary artery stenosis--Had follow up stress cardiolite study in February 2010 which showed EF of 55% with mild inferior hypokinesia--There was no ischemia    History  Smoking status  . Never Smoker   Smokeless tobacco  . Not on file    History  Alcohol Use No    History reviewed. No pertinent family history.  Review of Systems: The review of systems is as above.  All other systems were reviewed and are negative.  Physical Exam: BP 102/72  Pulse 78  Ht 5\' 9"  (1.753 m)  Wt 189 lb (85.73 kg)  BMI 27.91 kg/m2 Patient is very pleasant and in no acute distress. Skin is warm and dry. Color is normal.  HEENT is unremarkable. Normocephalic/atraumatic. PERRL. Sclera are  nonicteric. Neck is supple. No masses. No JVD. Lungs are clear. Cardiac exam shows a regular rate and rhythm. Abdomen is soft. Extremities are without edema. Gait and ROM are intact. No gross neurologic deficits noted.   LABORATORY DATA:   Assessment / Plan:

## 2011-01-07 NOTE — Patient Instructions (Signed)
Stay on your current medicines We will see you back in 6 months. You will see Dr. Angelina Sheriff Call for any problems

## 2011-01-07 NOTE — Assessment & Plan Note (Signed)
He is doing well. He is up to date with his stress testing. We will see him again in 6 months. He will see Dr. Antoine Poche on return. Patient is agreeable to this plan and will call if any problems develop in the interim.

## 2011-01-07 NOTE — Assessment & Plan Note (Signed)
Labs are checked today. No change in current therapy. He is encouraged to keep up with his exercise program. Patient is agreeable to this plan and will call if any problems develop in the interim.

## 2011-01-08 ENCOUNTER — Telehealth: Payer: Self-pay | Admitting: *Deleted

## 2011-01-08 DIAGNOSIS — I2510 Atherosclerotic heart disease of native coronary artery without angina pectoris: Secondary | ICD-10-CM

## 2011-01-08 NOTE — Telephone Encounter (Signed)
Message copied by Lorayne Bender on Tue Jan 08, 2011  5:08 PM ------      Message from: Rosalio Macadamia      Created: Mon Jan 07, 2011  4:09 PM       Ok to report. Labs are satisfactory.  Stay on same medicines. Recheck BMET/HPF/LIPIDS  in 6 months.

## 2011-01-08 NOTE — Telephone Encounter (Signed)
Notified of lab results. Will send copy to Dr. Christell Constant. Will repeat labs in 6 mo when sees Dr. Antoine Poche

## 2011-05-25 ENCOUNTER — Telehealth: Payer: Self-pay | Admitting: Oncology

## 2011-05-25 NOTE — Telephone Encounter (Signed)
S/w the pt's wife and she is aware of the march 2013 appt.

## 2011-06-12 ENCOUNTER — Telehealth: Payer: Self-pay | Admitting: Oncology

## 2011-06-12 NOTE — Telephone Encounter (Signed)
called pt and r/s appt on 03/01 to 03/15

## 2011-07-12 ENCOUNTER — Ambulatory Visit: Payer: Medicare Other | Admitting: Oncology

## 2011-07-26 ENCOUNTER — Ambulatory Visit (HOSPITAL_BASED_OUTPATIENT_CLINIC_OR_DEPARTMENT_OTHER): Payer: Medicare Other | Admitting: Oncology

## 2011-07-26 ENCOUNTER — Telehealth: Payer: Self-pay | Admitting: Oncology

## 2011-07-26 VITALS — BP 109/71 | HR 68 | Temp 96.7°F | Ht 69.0 in | Wt 187.9 lb

## 2011-07-26 DIAGNOSIS — C8589 Other specified types of non-Hodgkin lymphoma, extranodal and solid organ sites: Secondary | ICD-10-CM

## 2011-07-26 NOTE — Telephone Encounter (Signed)
appt made and printed for pt aom °

## 2011-07-26 NOTE — Progress Notes (Signed)
OFFICE PROGRESS NOTE   INTERVAL HISTORY:   He returns as scheduled. He feels well. He denies fever, night sweats, and anorexia. There are no palpable lymph nodes.  Objective:  Vital signs in last 24 hours:  Blood pressure 109/71, pulse 68, temperature 96.7 F (35.9 C), temperature source Oral, height 5\' 9"  (1.753 m), weight 187 lb 14.4 oz (85.231 kg).    HEENT: Neck without mass Lymphatics: No cervical, supraclavicular, axillary, or inguinal nodes. Resp: Lungs clear bilaterally Cardio: Regular rate and rhythm GI: No hepatosplenomegaly, no mass Vascular: No leg edema   Medications: I have reviewed the patient's current medications.  Assessment/Plan: 1. Non-Hodgkin lymphoma - he completed fludarabine chemotherapy in February 2003.  He remains in clinical remission. 2. History of coronary artery disease - status post coronary artery bypass surgery. 3. Pneumococcal vaccine in March 2009.   Disposition:  He remains in clinical remission from the non-Hodgkin's lymphoma. He will return for an office visit in one year. He will contact us in the interim for new symptoms.   Lucile Shutters, MD  07/26/2011  4:25 PM

## 2011-09-03 ENCOUNTER — Other Ambulatory Visit: Payer: Self-pay | Admitting: Cardiology

## 2011-09-03 MED ORDER — METOPROLOL TARTRATE 25 MG PO TABS: 25.0000 mg | ORAL_TABLET | Freq: Two times a day (BID) | ORAL | Status: DC

## 2011-09-03 MED ORDER — SIMVASTATIN 40 MG PO TABS: 40.0000 mg | ORAL_TABLET | Freq: Every day | ORAL | Status: DC

## 2011-12-30 ENCOUNTER — Telehealth: Payer: Self-pay | Admitting: Nurse Practitioner

## 2011-12-30 NOTE — Telephone Encounter (Signed)
Pt calling to see who and when he needs to see/be seen?

## 2011-12-30 NOTE — Telephone Encounter (Signed)
Pt called to know who is his MD now.  Pt was Dr. Ronnald Nian. Pt last O/V was in August /12 with  Kathi Ludwig NP. Pt was referred to Dr. Antoine Poche then. An appointment was made with Dr. Antoine Poche on 01/29/12 at 9:45 AM pt aware.

## 2012-01-29 ENCOUNTER — Encounter: Payer: Self-pay | Admitting: Cardiology

## 2012-01-29 ENCOUNTER — Ambulatory Visit (INDEPENDENT_AMBULATORY_CARE_PROVIDER_SITE_OTHER): Payer: Medicare Other | Admitting: Cardiology

## 2012-01-29 VITALS — BP 137/63 | HR 80 | Ht 70.0 in | Wt 184.4 lb

## 2012-01-29 DIAGNOSIS — E785 Hyperlipidemia, unspecified: Secondary | ICD-10-CM

## 2012-01-29 DIAGNOSIS — I2510 Atherosclerotic heart disease of native coronary artery without angina pectoris: Secondary | ICD-10-CM

## 2012-01-29 NOTE — Patient Instructions (Addendum)
The current medical regimen is effective;  continue present plan and medications.  Please have a fasting lipid panel drawn at [facility]  Follow up in 1 year with Dr Antoine Poche.  You will receive a letter in the mail 2 months before you are due.  Please call us when you receive this letter to schedule your follow up appointment.

## 2012-01-29 NOTE — Progress Notes (Signed)
   HPI The patient presents for follow up of CAD.  The patient presents as a new patient for me having previously been treated by Dr.Tennant.  Since last being seen he has done well.  He works in his yard and walks some.  The patient denies any new symptoms such as chest discomfort, neck or arm discomfort. There has been no new shortness of breath, PND or orthopnea. There have been no reported palpitations, presyncope or syncope.  No Known Allergies  Current Outpatient Prescriptions  Medication Sig Dispense Refill  . aspirin 325 MG tablet Take 325 mg by mouth daily.        . metoprolol tartrate (LOPRESSOR) 25 MG tablet Take 1 tablet (25 mg total) by mouth 2 (two) times daily.  180 tablet  1  . Multiple Vitamin (MULTIVITAMIN PO) Take by mouth daily.        . nitroGLYCERIN (NITROSTAT) 0.4 MG SL tablet Place 0.4 mg under the tongue as needed.        . simvastatin (ZOCOR) 40 MG tablet Take 1 tablet (40 mg total) by mouth daily.  90 tablet  1    Past Medical History  Diagnosis Date  . Lymphoma 2003    without recurrence  . Hyperlipidemia   . Hypercholesterolemia   . Coronary artery disease     s/p CABG in 2007 for seere left main disease  . Normal nuclear stress test March 2012    EF 55%. No ischemia. Mild inferior scar    Past Surgical History  Procedure Date  . Coronary artery bypass graft 2007    Had coronary artery bypass grafting for severe left main coronary artery stenosis--Had follow up stress cardiolite study in February 2010 which showed EF of 55% with mild inferior hypokinesia--There was no ischemia    ROS:  As stated in the HPI and negative for all other systems.  PHYSICAL EXAM BP 137/63  Pulse 80  Ht 5\' 10"  (1.778 m)  Wt 184 lb 6.4 oz (83.643 kg)  BMI 26.46 kg/m2 GENERAL:  Well appearing HEENT:  Pupils equal round and reactive, fundi not visualized, oral mucosa unremarkable NECK:  No jugular venous distention, waveform within normal limits, carotid upstroke brisk  and symmetric, no bruits, no thyromegaly LYMPHATICS:  No cervical, inguinal adenopathy LUNGS:  Clear to auscultation bilaterally BACK:  No CVA tenderness CHEST:  Well healed sternotomy scar. HEART:  PMI not displaced or sustained,S1 and S2 within normal limits, no S3, no S4, no clicks, no rubs, no murmurs ABD:  Flat, positive bowel sounds normal in frequency in pitch, no bruits, no rebound, no guarding, no midline pulsatile mass, no hepatomegaly, no splenomegaly EXT:  2 plus pulses throughout, no edema, no cyanosis no clubbing SKIN:  No rashes no nodules NEURO:  Cranial nerves II through XII grossly intact, motor grossly intact throughout PSYCH:  Cognitively intact, oriented to person place and time  EKG:  Sinus rhythm, rate 80, axis within normal limits, intervals within normal limits, no acute ST-T wave changes. 01/29/2012   ASSESSMENT AND PLAN  Coronary artery disease) -  The patient has no new sypmtoms.  No further cardiovascular testing is indicated.  We will continue with aggressive risk reduction and meds as listed.   Hyperlipidemia -  He will go to Dr. Kathi Der office for a fasting lipid with a goal LDL less than 100 and HDL greater than 40.

## 2012-02-07 ENCOUNTER — Encounter: Payer: Self-pay | Admitting: Cardiology

## 2012-04-02 ENCOUNTER — Other Ambulatory Visit: Payer: Self-pay | Admitting: Cardiology

## 2012-04-02 MED ORDER — SIMVASTATIN 40 MG PO TABS: 40.0000 mg | ORAL_TABLET | Freq: Every day | ORAL | Status: DC

## 2012-04-06 ENCOUNTER — Other Ambulatory Visit: Payer: Self-pay

## 2012-04-06 MED ORDER — SIMVASTATIN 40 MG PO TABS: 40.0000 mg | ORAL_TABLET | Freq: Every day | ORAL | Status: DC

## 2012-04-06 MED ORDER — METOPROLOL TARTRATE 25 MG PO TABS: 25.0000 mg | ORAL_TABLET | Freq: Two times a day (BID) | ORAL | Status: DC

## 2012-07-22 ENCOUNTER — Telehealth: Payer: Self-pay | Admitting: Oncology

## 2012-07-22 NOTE — Telephone Encounter (Signed)
PT CALLED TO R/S NO EXPLANATION.....DONE

## 2012-07-24 ENCOUNTER — Ambulatory Visit: Payer: Medicare Other | Admitting: Oncology

## 2012-08-31 ENCOUNTER — Ambulatory Visit (HOSPITAL_BASED_OUTPATIENT_CLINIC_OR_DEPARTMENT_OTHER): Payer: Medicare Other | Admitting: Oncology

## 2012-08-31 ENCOUNTER — Telehealth: Payer: Self-pay | Admitting: Oncology

## 2012-08-31 VITALS — BP 127/67 | HR 69 | Temp 96.9°F | Resp 20 | Ht 70.0 in | Wt 181.2 lb

## 2012-08-31 DIAGNOSIS — Z23 Encounter for immunization: Secondary | ICD-10-CM

## 2012-08-31 DIAGNOSIS — C8589 Other specified types of non-Hodgkin lymphoma, extranodal and solid organ sites: Secondary | ICD-10-CM

## 2012-08-31 MED ORDER — PNEUMOCOCCAL VAC POLYVALENT 25 MCG/0.5ML IJ INJ
0.5000 mL | INJECTION | Freq: Once | INTRAMUSCULAR | Status: AC
  Administered 2012-08-31: 0.5 mL via INTRAMUSCULAR
  Filled 2012-08-31: qty 0.5

## 2012-08-31 NOTE — Telephone Encounter (Signed)
gv and printed appt sched and avs for pt  °

## 2012-08-31 NOTE — Progress Notes (Signed)
   [facility]    OFFICE PROGRESS NOTE   INTERVAL HISTORY:   Mr. Weisensel returns as scheduled. He feels well. Good appetite. No fever, night sweats, or palpable lymph nodes. He reports "arthritis "discomfort at the left knee  Objective:  Vital signs in last 24 hours:  Blood pressure 127/67, pulse 69, temperature 96.9 F (36.1 C), temperature source Oral, resp. rate 20, height 5\' 10"  (1.778 m), weight 181 lb 3.2 oz (82.192 kg).    HEENT: Neck without mass Lymphatics: No cervical, supraclavicular, or inguinal nodes. Prominent bilateral axillary fat pads. Resp: Lungs clear bilaterally Cardio: Regular rate and rhythm GI: No hepatomegaly, nontender, no mass Vascular: The left lower leg is slightly larger than the right side with venous varicosities    Medications: I have reviewed the patient's current medications.  Assessment/Plan: 1. Non-Hodgkin lymphoma - he completed fludarabine chemotherapy in February 2003. He remains in clinical remission. 2. History of coronary artery disease - status post coronary artery bypass surgery. 3. Pneumococcal vaccine in March 2009. He will receive a pneumococcal vaccine today.  Disposition:  Mr. Ose remains in clinical remission from non-Hodgkin's lymphoma. He would like to continue followup at the cancer Center. He will return for an office visit in one year. He received a pneumococcal vaccine today.   Thornton Papas, MD  08/31/2012  8:58 AM

## 2012-08-31 NOTE — Addendum Note (Signed)
Addended by: Merlene Laughter B on: 08/31/2012 09:18 AM   Modules accepted: Orders

## 2013-02-18 ENCOUNTER — Encounter: Payer: Self-pay | Admitting: Family Medicine

## 2013-02-18 ENCOUNTER — Encounter (INDEPENDENT_AMBULATORY_CARE_PROVIDER_SITE_OTHER): Payer: Self-pay

## 2013-02-18 ENCOUNTER — Ambulatory Visit (INDEPENDENT_AMBULATORY_CARE_PROVIDER_SITE_OTHER): Payer: Medicare Other | Admitting: Family Medicine

## 2013-02-18 VITALS — BP 131/80 | HR 54 | Temp 96.9°F | Ht 70.0 in | Wt 175.0 lb

## 2013-02-18 DIAGNOSIS — E785 Hyperlipidemia, unspecified: Secondary | ICD-10-CM

## 2013-02-18 DIAGNOSIS — I10 Essential (primary) hypertension: Secondary | ICD-10-CM

## 2013-02-18 LAB — POCT CBC
Granulocyte percent: 57.5 %G (ref 37–80)
HCT, POC: 44.2 % (ref 43.5–53.7)
Hemoglobin: 14.9 g/dL (ref 14.1–18.1)
Lymph, poc: 2.3 (ref 0.6–3.4)
MCH, POC: 29.3 pg (ref 27–31.2)
MCHC: 33.7 g/dL (ref 31.8–35.4)
MCV: 86.8 fL (ref 80–97)
MPV: 6.1 fL (ref 0–99.8)
POC Granulocyte: 3.8 (ref 2–6.9)
POC LYMPH PERCENT: 35.2 %L (ref 10–50)
Platelet Count, POC: 189 10*3/uL (ref 142–424)
RBC: 5.1 M/uL (ref 4.69–6.13)
RDW, POC: 12.8 %
WBC: 6.6 10*3/uL (ref 4.6–10.2)

## 2013-02-18 MED ORDER — SIMVASTATIN 40 MG PO TABS: 40.0000 mg | ORAL_TABLET | Freq: Every day | ORAL | Status: DC

## 2013-02-18 MED ORDER — METOPROLOL TARTRATE 25 MG PO TABS: 25.0000 mg | ORAL_TABLET | Freq: Two times a day (BID) | ORAL | Status: DC

## 2013-02-18 NOTE — Progress Notes (Signed)
  Subjective:    Patient ID: David Little, male    DOB: [birth year], 77 y.o.   MRN: [id number]  HPI  This 77 y.o. male presents for evaluation of hypertension and hyperlipidemia And he needs refills.  He otherwise has no acute concerns.  Review of Systems    No chest pain, SOB, HA, dizziness, vision change, N/V, diarrhea, constipation, dysuria, urinary urgency or frequency, myalgias, arthralgias or rash.  Objective:   Physical Exam  Vital signs noted  Well developed well nourished male.  HEENT - Head atraumatic Normocephalic                Eyes - PERRLA, Conjuctiva - clear Sclera- Clear EOMI                Ears - EAC's Wnl TM's Wnl Gross Hearing WNL                Nose - Nares patent                 Throat - oropharanx wnl Respiratory - Lungs CTA bilateral Cardiac - RRR S1 and S2 without murmur GI - Abdomen soft Nontender and bowel sounds active x 4 Extremities - No edema. Neuro - Grossly intact.      Assessment & Plan:  Essential hypertension, benign - Plan: POCT CBC, CMP14+EGFR, simvastatin (ZOCOR) 40 MG tablet, metoprolol tartrate (LOPRESSOR) 25 MG tablet  Other and unspecified hyperlipidemia - Plan: POCT CBC, CMP14+EGFR, simvastatin (ZOCOR) 40 MG tablet, metoprolol tartrate (LOPRESSOR) 25 MG tablet  Deatra Canter FNP

## 2013-02-18 NOTE — Patient Instructions (Signed)

## 2013-02-19 LAB — CMP14+EGFR
ALT: 11 IU/L (ref 0–44)
AST: 19 IU/L (ref 0–40)
Albumin/Globulin Ratio: 2.3 (ref 1.1–2.5)
Albumin: 4.3 g/dL (ref 3.5–4.8)
Alkaline Phosphatase: 55 IU/L (ref 39–117)
BUN/Creatinine Ratio: 16 (ref 10–22)
BUN: 17 mg/dL (ref 8–27)
CO2: 26 mmol/L (ref 18–29)
Calcium: 9.1 mg/dL (ref 8.6–10.2)
Chloride: 105 mmol/L (ref 97–108)
Creatinine, Ser: 1.05 mg/dL (ref 0.76–1.27)
GFR calc Af Amer: 80 mL/min/{1.73_m2} (ref >59)
GFR calc non Af Amer: 70 mL/min/{1.73_m2} (ref >59)
Globulin, Total: 1.9 g/dL (ref 1.5–4.5)
Glucose: 92 mg/dL (ref 65–99)
Potassium: 5.1 mmol/L (ref 3.5–5.2)
Sodium: 144 mmol/L (ref 134–144)
Total Bilirubin: 0.7 mg/dL (ref 0.0–1.2)
Total Protein: 6.2 g/dL (ref 6.0–8.5)

## 2013-02-25 ENCOUNTER — Encounter: Payer: Self-pay | Admitting: *Deleted

## 2013-07-15 ENCOUNTER — Telehealth: Payer: Self-pay | Admitting: Cardiology

## 2013-07-15 NOTE — Telephone Encounter (Signed)
New message      Pt is having a tooth pulled---need to know if he needs to stop his aspirin,

## 2013-07-15 NOTE — Telephone Encounter (Signed)
I spoke with pt & reviewed past ov note.  Pt was last seen 01/2012  I have made him an appt to see Dr. Percival Spanish tomorrow as he is also overdue. Pt agrees to date & time Horton Chin RN

## 2013-07-16 ENCOUNTER — Encounter: Payer: Self-pay | Admitting: Cardiology

## 2013-07-16 ENCOUNTER — Ambulatory Visit (INDEPENDENT_AMBULATORY_CARE_PROVIDER_SITE_OTHER): Payer: Medicare Other | Admitting: Cardiology

## 2013-07-16 VITALS — BP 120/70 | HR 63 | Ht 70.0 in | Wt 182.0 lb

## 2013-07-16 DIAGNOSIS — I2510 Atherosclerotic heart disease of native coronary artery without angina pectoris: Secondary | ICD-10-CM

## 2013-07-16 DIAGNOSIS — E785 Hyperlipidemia, unspecified: Secondary | ICD-10-CM

## 2013-07-16 NOTE — Patient Instructions (Signed)
The current medical regimen is effective;  continue present plan and medications.  Please have fasting blood work drawn at [facility].  Follow up in 1 year with Dr Percival Spanish.  You will receive a letter in the mail 2 months before you are due.  Please call us when you receive this letter to schedule your follow up appointment.

## 2013-07-16 NOTE — Progress Notes (Signed)
    HPI The patient presents for follow up of CAD.  The patient denies any new symptoms such as chest discomfort, neck or arm discomfort. There has been no new shortness of breath, PND or orthopnea. There have been no reported palpitations, presyncope or syncope.  He does some yard work. He wants to get to the extracted and his oral surgeon wanted to stop his aspirin.  No Known Allergies  Current Outpatient Prescriptions  Medication Sig Dispense Refill  . aspirin 325 MG tablet Take 325 mg by mouth daily.        . metoprolol tartrate (LOPRESSOR) 25 MG tablet Take 1 tablet (25 mg total) by mouth 2 (two) times daily.  180 tablet  3  . Multiple Vitamin (MULTIVITAMIN PO) Take by mouth daily.        . nitroGLYCERIN (NITROSTAT) 0.4 MG SL tablet Place 0.4 mg under the tongue as needed.        . simvastatin (ZOCOR) 40 MG tablet Take 1 tablet (40 mg total) by mouth daily.  90 tablet  3   No current facility-administered medications for this visit.    Past Medical History  Diagnosis Date  . Lymphoma 2003    without recurrence  . Hyperlipidemia   . Hypercholesterolemia   . Coronary artery disease     s/p CABG in 2007 for seere left main disease  . Normal nuclear stress test March 2012    EF 55%. No ischemia. Mild inferior scar    Past Surgical History  Procedure Laterality Date  . Coronary artery bypass graft  2007    Had coronary artery bypass grafting for severe left main coronary artery stenosis--Had follow up stress cardiolite study in February 2010 which showed EF of 55% with mild inferior hypokinesia--There was no ischemia    ROS:  As stated in the HPI and negative for all other systems.  PHYSICAL EXAM BP 120/70  Pulse 63  Ht 5\' 10"  (1.778 m)  Wt 182 lb (82.555 kg)  BMI 26.11 kg/m2 GENERAL:  Well appearing NECK:  No jugular venous distention, waveform within normal limits, carotid upstroke brisk and symmetric, no bruits, no thyromegaly LUNGS:  Clear to auscultation  bilaterally BACK:  No CVA tenderness CHEST:  Well healed sternotomy scar. HEART:  PMI not displaced or sustained,S1 and S2 within normal limits, no S3, no S4, no clicks, no rubs, no murmurs ABD:  Flat, positive bowel sounds normal in frequency in pitch, no bruits, no rebound, no guarding, no midline pulsatile mass, no hepatomegaly, no splenomegaly EXT:  2 plus pulses throughout, no edema, no cyanosis no clubbing  place and time  EKG:  Sinus rhythm, rate 63, axis within normal limits, intervals within normal limits, no acute ST-T wave changes. 07/16/2013   ASSESSMENT AND PLAN  Coronary artery disease) -  The patient has no new sypmtoms.  No further cardiovascular testing is indicated.  We will continue with aggressive risk reduction and meds as listed.  I would prefer that he stay on aspirin unless his oral surgeon will not do the dental extraction on that medication. At that point, although there is some slight risk for cardio vascular events off aspirin he would want to hold it.  Hyperlipidemia -  He will go to Dr. Tawanna Sat office for a fasting lipid with a goal LDL less than 100 and HDL greater than 40.

## 2013-07-19 ENCOUNTER — Other Ambulatory Visit (INDEPENDENT_AMBULATORY_CARE_PROVIDER_SITE_OTHER): Payer: Medicare Other

## 2013-07-19 ENCOUNTER — Other Ambulatory Visit: Payer: Self-pay | Admitting: *Deleted

## 2013-07-19 DIAGNOSIS — E785 Hyperlipidemia, unspecified: Secondary | ICD-10-CM

## 2013-07-19 DIAGNOSIS — I10 Essential (primary) hypertension: Secondary | ICD-10-CM

## 2013-07-19 MED ORDER — METOPROLOL TARTRATE 25 MG PO TABS: 25.0000 mg | ORAL_TABLET | Freq: Two times a day (BID) | ORAL | Status: DC

## 2013-07-19 MED ORDER — SIMVASTATIN 40 MG PO TABS: 40.0000 mg | ORAL_TABLET | Freq: Every day | ORAL | Status: DC

## 2013-07-20 LAB — LIPID PANEL
CHOL/HDL RATIO: 2.6 ratio (ref 0.0–5.0)
Cholesterol, Total: 148 mg/dL (ref 100–199)
HDL: 58 mg/dL (ref >39)
LDL Calculated: 73 mg/dL (ref 0–99)
TRIGLYCERIDES: 83 mg/dL (ref 0–149)
VLDL CHOLESTEROL CAL: 17 mg/dL (ref 5–40)

## 2013-07-20 LAB — HEPATIC FUNCTION PANEL
ALT: 11 IU/L (ref 0–44)
AST: 21 IU/L (ref 0–40)
Albumin: 4.3 g/dL (ref 3.5–4.8)
Alkaline Phosphatase: 55 IU/L (ref 39–117)
Bilirubin, Direct: 0.21 mg/dL (ref 0.00–0.40)
Total Bilirubin: 0.8 mg/dL (ref 0.0–1.2)
Total Protein: 6.1 g/dL (ref 6.0–8.5)

## 2013-07-27 ENCOUNTER — Telehealth: Payer: Self-pay | Admitting: Cardiology

## 2013-07-27 NOTE — Telephone Encounter (Signed)
Pt is aware of lab results.

## 2013-07-27 NOTE — Telephone Encounter (Signed)
New message  ° ° °Patient calling for test results.   °

## 2013-08-27 ENCOUNTER — Telehealth: Payer: Self-pay | Admitting: Oncology

## 2013-08-27 NOTE — Telephone Encounter (Signed)
Doublebook per MD 4/20 11:30 am

## 2013-08-30 ENCOUNTER — Ambulatory Visit (HOSPITAL_BASED_OUTPATIENT_CLINIC_OR_DEPARTMENT_OTHER): Payer: Medicare Other | Admitting: Oncology

## 2013-08-30 VITALS — BP 122/82 | HR 65 | Temp 98.2°F | Resp 19 | Ht 70.0 in | Wt 182.0 lb

## 2013-08-30 DIAGNOSIS — C8589 Other specified types of non-Hodgkin lymphoma, extranodal and solid organ sites: Secondary | ICD-10-CM

## 2013-08-30 DIAGNOSIS — M7989 Other specified soft tissue disorders: Secondary | ICD-10-CM

## 2013-08-30 DIAGNOSIS — M25569 Pain in unspecified knee: Secondary | ICD-10-CM

## 2013-08-30 NOTE — Progress Notes (Signed)
  [name] OFFICE PROGRESS NOTE   Diagnosis: Non-Hodgkin's lymphoma  INTERVAL HISTORY:   [name] returns as scheduled. He feels well. No fever, night sweats, or palpable lymph nodes. He complains of intermittent discomfort in the left knee. The left knee is swollen. There is a chronic varicosity at the left leg with mild left leg edema.  Objective:  Vital signs in last 24 hours:  Blood pressure 122/82, pulse 65, temperature 98.2 F (36.8 C), temperature source Oral, resp. rate 19, height 5\' 10"  (1.778 m), weight 182 lb (82.555 kg), SpO2 100.00%.    HEENT: Neck without mass Lymphatics: No cervical, supraclavicular, axillary, or inguinal nodes. Resp: Lungs clear bilaterally Cardio: Regular rate and rhythm GI: No hepatosplenomegaly Vascular: Trace edema at the left leg below the knee with varicosities  Musculoskeletal: Swelling around the left knee with mild crepitance and discomfort with flexion/extension    Medications: I have reviewed the patient's current medications.  Assessment/Plan: 1. Non-Hodgkin lymphoma - he completed fludarabine chemotherapy in February 2003. He remains in clinical remission. 2. History of coronary artery disease - status post coronary artery bypass surgery. 3. Pneumococcal vaccine in April 2014 4.   Left knee pain and swelling-? Left knee arthritis   Disposition:  David Little remains in clinical remission from non-Hodgkin's lymphoma. He would like to continue followup at the [facility]. He will return for an office visit in one year. He will stay up-to-date on the influenza and pneumococcal vaccines.  I will refer him to Dr. Berenice Primas to evaluate the left knee swelling.  David Pier, MD  08/30/2013  12:42 PM

## 2013-08-31 ENCOUNTER — Telehealth: Payer: Self-pay | Admitting: Oncology

## 2013-08-31 NOTE — Telephone Encounter (Signed)
S/w the pt and he is aware of his April 2016 .

## 2013-09-07 ENCOUNTER — Telehealth: Payer: Self-pay | Admitting: *Deleted

## 2013-09-07 NOTE — Telephone Encounter (Signed)
Call from pt to follow up on orthopedic referral. Called [facility], they were not contacted with referral. Appt scheduled for 4/30 at 1515. Called pt with appt, he voiced appreciation for call.

## 2013-09-17 ENCOUNTER — Encounter (HOSPITAL_COMMUNITY): Payer: Self-pay | Admitting: Pharmacy Technician

## 2013-09-21 ENCOUNTER — Other Ambulatory Visit: Payer: Self-pay | Admitting: Orthopedic Surgery

## 2013-09-22 ENCOUNTER — Encounter (HOSPITAL_COMMUNITY): Payer: Self-pay

## 2013-09-22 ENCOUNTER — Ambulatory Visit (HOSPITAL_COMMUNITY)
Admission: RE | Admit: 2013-09-22 | Discharge: 2013-09-22 | Disposition: A | Discharge status: Home / Self Care | Payer: Medicare Other | Source: Ambulatory Visit | Attending: Anesthesiology | Admitting: Anesthesiology

## 2013-09-22 ENCOUNTER — Encounter (HOSPITAL_COMMUNITY)
Admission: RE | Admit: 2013-09-22 | Discharge: 2013-09-22 | Disposition: A | Discharge status: Home / Self Care | Payer: Medicare Other | Source: Ambulatory Visit | Attending: Orthopedic Surgery | Admitting: Orthopedic Surgery

## 2013-09-22 ENCOUNTER — Other Ambulatory Visit (HOSPITAL_COMMUNITY): Payer: Self-pay | Admitting: *Deleted

## 2013-09-22 DIAGNOSIS — Z01818 Encounter for other preprocedural examination: Secondary | ICD-10-CM | POA: Insufficient documentation

## 2013-09-22 DIAGNOSIS — I2510 Atherosclerotic heart disease of native coronary artery without angina pectoris: Secondary | ICD-10-CM | POA: Insufficient documentation

## 2013-09-22 HISTORY — DX: Acute myocardial infarction, unspecified: I21.9

## 2013-09-22 HISTORY — DX: Unspecified malignant neoplasm of skin of unspecified part of face: C44.300

## 2013-09-22 LAB — CBC
HCT: 41.8 % (ref 39.0–52.0)
Hemoglobin: 14 g/dL (ref 13.0–17.0)
MCH: 29.4 pg (ref 26.0–34.0)
MCHC: 33.5 g/dL (ref 30.0–36.0)
MCV: 87.6 fL (ref 78.0–100.0)
Platelets: 178 10*3/uL (ref 150–400)
RBC: 4.77 MIL/uL (ref 4.22–5.81)
RDW: 13.4 % (ref 11.5–15.5)
WBC: 8.5 10*3/uL (ref 4.0–10.5)

## 2013-09-22 LAB — URINALYSIS, ROUTINE W REFLEX MICROSCOPIC
Bilirubin Urine: NEGATIVE
GLUCOSE, UA: NEGATIVE mg/dL
Hgb urine dipstick: NEGATIVE
KETONES UR: NEGATIVE mg/dL
Leukocytes, UA: NEGATIVE
Nitrite: NEGATIVE
Protein, ur: NEGATIVE mg/dL
Specific Gravity, Urine: 1.026 (ref 1.005–1.030)
UROBILINOGEN UA: 0.2 mg/dL (ref 0.0–1.0)
pH: 5.5 (ref 5.0–8.0)

## 2013-09-22 LAB — BASIC METABOLIC PANEL
BUN: 19 mg/dL (ref 6–23)
CALCIUM: 9.5 mg/dL (ref 8.4–10.5)
CO2: 26 meq/L (ref 19–32)
CREATININE: 1.09 mg/dL (ref 0.50–1.35)
Chloride: 109 mEq/L (ref 96–112)
GFR calc Af Amer: 75 mL/min — ABNORMAL LOW (ref >90)
GFR, EST NON AFRICAN AMERICAN: 64 mL/min — AB (ref >90)
Glucose, Bld: 82 mg/dL (ref 70–99)
Potassium: 5.5 mEq/L — ABNORMAL HIGH (ref 3.7–5.3)
Sodium: 144 mEq/L (ref 137–147)

## 2013-09-22 LAB — DIFFERENTIAL
BASOS PCT: 0 % (ref 0–1)
Basophils Absolute: 0 10*3/uL (ref 0.0–0.1)
Eosinophils Absolute: 0.3 10*3/uL (ref 0.0–0.7)
Eosinophils Relative: 4 % (ref 0–5)
Lymphocytes Relative: 38 % (ref 12–46)
Lymphs Abs: 3.2 10*3/uL (ref 0.7–4.0)
MONO ABS: 0.5 10*3/uL (ref 0.1–1.0)
MONOS PCT: 6 % (ref 3–12)
Neutro Abs: 4.4 10*3/uL (ref 1.7–7.7)
Neutrophils Relative %: 52 % (ref 43–77)

## 2013-09-22 LAB — HEPATIC FUNCTION PANEL
ALK PHOS: 54 U/L (ref 39–117)
ALT: 11 U/L (ref 0–53)
AST: 22 U/L (ref 0–37)
Albumin: 4.1 g/dL (ref 3.5–5.2)
Bilirubin, Direct: <0.2 mg/dL (ref 0.0–0.3)
Total Bilirubin: 0.7 mg/dL (ref 0.3–1.2)
Total Protein: 6.7 g/dL (ref 6.0–8.3)

## 2013-09-22 LAB — PROTIME-INR
INR: 1.11 (ref 0.00–1.49)
PROTHROMBIN TIME: 14.1 s (ref 11.6–15.2)

## 2013-09-22 LAB — TYPE AND SCREEN
ABO/RH(D): O NEG
Antibody Screen: NEGATIVE

## 2013-09-22 LAB — SURGICAL PCR SCREEN
MRSA, PCR: POSITIVE — AB
Staphylococcus aureus: POSITIVE — AB

## 2013-09-22 LAB — APTT: aPTT: 31 seconds (ref 24–37)

## 2013-09-22 NOTE — Progress Notes (Signed)
Juliann Pulse at Dr Berenice Primas office made aware that orders needed to be signed in [place]. I inquired about clearance Juliann Pulse stated they were waiting for cardiac clearance from Dr Hochrein's office.

## 2013-09-22 NOTE — Progress Notes (Signed)
David Little made aware that patient's nasal swab tested positive for MRSA and staph. Patient made aware of results and that prescription was called in to [facility]. Patient verbalized understanding of instructions.

## 2013-09-22 NOTE — Pre-Procedure Instructions (Signed)
David Little  09/22/2013   Your procedure is scheduled on:  Wednesday, Sep 29, 2013 at 12:00 PM.   Report to [facility] Entrance "A" Admitting Office at 10:00 AM.   Call this number if you have problems the morning of surgery: [phone]   Remember:   Do not eat food or drink liquids after midnight Tuesday, 09/28/13.   Take these medicines the morning of surgery with A SIP OF WATER: metoprolol tartrate (LOPRESSOR), nitroGLYCERIN (NITROSTAT) - if needed.  Stop Vitamins, Aspirin and NSAIDS (Aleve, Naproxen, Ibuprofen, etc) as of today.   Continue to take all your other medicines as you normally do until day of surgery and then follow above instructions.     Do not wear jewelry.  Do not wear lotions, powders, or cologne. You may wear deodorant.  Men may shave face and neck.  Do not bring valuables to the hospital.  [facility] is not responsible                  for any belongings or valuables.               Contacts, dentures or bridgework may not be worn into surgery.  Leave suitcase in the car. After surgery it may be brought to your room.  For patients admitted to the hospital, discharge time is determined by your                treatment team.     Special Instructions: [facility] - Preparing for Surgery  Before surgery, you can play an important role.  Because skin is not sterile, your skin needs to be as free of germs as possible.  You can reduce the number of germs on you skin by washing with CHG (chlorahexidine gluconate) soap before surgery.  CHG is an antiseptic cleaner which kills germs and bonds with the skin to continue killing germs even after washing.  Please DO NOT use if you have an allergy to CHG or antibacterial soaps.  If your skin becomes reddened/irritated stop using the CHG and inform your nurse when you arrive at Short Stay.  Do not shave (including legs and underarms) for at least 48 hours prior to the first CHG shower.  You may shave your  face.  Please follow these instructions carefully:   1.  Shower with CHG Soap the night before surgery and the                                morning of Surgery.  2.  If you choose to wash your hair, wash your hair first as usual with your       normal shampoo.  3.  After you shampoo, rinse your hair and body thoroughly to remove the                      Shampoo.  4.  Use CHG as you would any other liquid soap.  You can apply chg directly       to the skin and wash gently with scrungie or a clean washcloth.  5.  Apply the CHG Soap to your body ONLY FROM THE NECK DOWN.        Do not use on open wounds or open sores.  Avoid contact with your eyes, ears, mouth and genitals (private parts).  Wash genitals (private parts) with your normal soap.  6.  Wash thoroughly, paying special attention to the area where your surgery        will be performed.  7.  Thoroughly rinse your body with warm water from the neck down.  8.  DO NOT shower/wash with your normal soap after using and rinsing off       the CHG Soap.  9.  Pat yourself dry with a clean towel.            10.  Wear clean pajamas.            11.  Place clean sheets on your bed the night of your first shower and do not        sleep with pets.  Day of Surgery  Do not apply any lotions the morning of surgery.  Please wear clean clothes to the hospital/surgery center.     Please read over the following fact sheets that you were given: Pain Booklet, Coughing and Deep Breathing, Blood Transfusion Information, MRSA Information and Surgical Site Infection Prevention

## 2013-09-23 ENCOUNTER — Telehealth: Payer: Self-pay | Admitting: Cardiology

## 2013-09-23 NOTE — Telephone Encounter (Signed)
Received request from Nurse fax box, documents faxed for surgical clearance. To: [facility] Fax number: [phone] Attention: [phone]/km

## 2013-09-23 NOTE — Progress Notes (Addendum)
Anesthesia Chart Review:  Patient is a 78 year old male scheduled for left TKR on 09/29/13 by Dr. Berenice Primas.  History includes former smoker, hyperlipidemia, CAD/MI s/p emergency CABG (LIMA to LAD, sequential SVG to D1 and OM2) '07, skin cancer, non-Hodgkin's lymphoma s/p chemotherapy 2003 and remains in clinical remission as of 08/30/13. No vitals are recorded for his PAT visit. Oncologist is Dr. Benay Spice.  Cardiologist is Dr. Percival Spanish, last visit 07/17/13.  No cardiac testing was felt indicated at that time.     EKG on 07/16/13 showed: Sinus rhythm, rate 63, axis within normal limits, intervals within normal limits, no acute ST-T wave changes.   Nuclear stress test on 07/19/10 showed: Overall Impression Comments: There is mild scar at the base of the inferior wall. There is no ischemia. Mild hypokinesis of the septum and the base of the inferior wall. EF 55%. Low-risk nuclear study.  CXR on 09/22/13 showed no edema or consolidation.  Preoperative labs noted.  K 5.5 (no mention of hemolysis).  Cr 1.09.  He is on a MVI, but no KCL supplement or ACEI/ARB. Will do an ISTAT to ensure no significant increase in his hyperkalemia.  Orders were not signed at his PAT appointment, so any additional lab orders will have to be done on the day of surgery.  PAT RN notes indicate that Dr. Berenice Primas' office has requested cardiac clearance, but are waiting for a response from Dr. Percival Spanish.  I'll put chart up for nurse follow-up.  If he is ultimately cleared, follow-up labs are acceptable, and no acute changes then I would anticipate that he could proceed as planned. (Update: Cardiac clearance form received 09/28/2013.)  Myra Gianotti, PA-C [facility] [address] Short Stay Center/Anesthesiology Phone [phone] 09/23/2013 3:16 PM

## 2013-09-24 NOTE — Progress Notes (Signed)
Left message on David Little's voice mail  At Dr. Berenice Primas office regarding need for cardiac clearance & if they have rec'd that statement from Dr. Percival Spanish office that we also need the information for anesth. purpose.

## 2013-09-28 ENCOUNTER — Telehealth: Payer: Self-pay | Admitting: Cardiology

## 2013-09-28 MED ORDER — CHLORHEXIDINE GLUCONATE 4 % EX LIQD
60.0000 mL | Freq: Once | CUTANEOUS | Status: DC
  Filled 2013-09-28: qty 60

## 2013-09-28 MED ORDER — CEFAZOLIN SODIUM-DEXTROSE 2-3 GM-% IV SOLR
2.0000 g | INTRAVENOUS | Status: AC
  Administered 2013-09-29: 2 g via INTRAVENOUS
  Filled 2013-09-28: qty 50

## 2013-09-28 NOTE — Telephone Encounter (Signed)
[facility] Clearance Faxed to [facility] Short Stay at [phone] Call back [phone] ( ok to Send Per Barnett Applebaum F) [phone]/kdm

## 2013-09-28 NOTE — Progress Notes (Addendum)
Call to [facility] Heart grp. Spoke with Deena in Health info./ Med records to request cardiac clearance. .  Since the clearance was done on [place] note, it needs to be addressed by a Freight forwarder at [facility]. in order to have a document or statement made for clearance for Anesthesia purposes.    Barnett Applebaum will call me back.

## 2013-09-28 NOTE — Telephone Encounter (Signed)
09/28/13-[facility] received request from [facility] from [facility] for surgical clearance request. Clearance not scanned in yet, was faxed to [facility] on 09/23/13. Ok to provide Short Stay with copy. rmf

## 2013-09-29 ENCOUNTER — Encounter (HOSPITAL_COMMUNITY): Payer: Self-pay | Admitting: *Deleted

## 2013-09-29 ENCOUNTER — Inpatient Hospital Stay (HOSPITAL_COMMUNITY)
Admission: RE | Admit: 2013-09-29 | Discharge: 2013-09-30 | DRG: 470 | Disposition: A | Discharge status: Home Health | Payer: Medicare Other | Source: Ambulatory Visit | Attending: Orthopedic Surgery | Admitting: Orthopedic Surgery

## 2013-09-29 ENCOUNTER — Inpatient Hospital Stay (HOSPITAL_COMMUNITY): Payer: Medicare Other | Admitting: Anesthesiology

## 2013-09-29 ENCOUNTER — Encounter (HOSPITAL_COMMUNITY): Payer: Medicare Other | Admitting: Vascular Surgery

## 2013-09-29 ENCOUNTER — Encounter (HOSPITAL_COMMUNITY)
Admission: RE | Disposition: A | Discharge status: Home Health | Payer: Self-pay | Source: Ambulatory Visit | Attending: Orthopedic Surgery

## 2013-09-29 DIAGNOSIS — I2510 Atherosclerotic heart disease of native coronary artery without angina pectoris: Secondary | ICD-10-CM | POA: Diagnosis present

## 2013-09-29 DIAGNOSIS — I252 Old myocardial infarction: Secondary | ICD-10-CM

## 2013-09-29 DIAGNOSIS — Z951 Presence of aortocoronary bypass graft: Secondary | ICD-10-CM

## 2013-09-29 DIAGNOSIS — M1710 Unilateral primary osteoarthritis, unspecified knee: Principal | ICD-10-CM | POA: Diagnosis present

## 2013-09-29 DIAGNOSIS — M1712 Unilateral primary osteoarthritis, left knee: Secondary | ICD-10-CM | POA: Diagnosis present

## 2013-09-29 HISTORY — PX: TOTAL KNEE ARTHROPLASTY: SHX125

## 2013-09-29 LAB — POCT I-STAT 4, (NA,K, GLUC, HGB,HCT)
Glucose, Bld: 97 mg/dL (ref 70–99)
HCT: 42 % (ref 39.0–52.0)
Hemoglobin: 14.3 g/dL (ref 13.0–17.0)
Potassium: 4.2 mEq/L (ref 3.7–5.3)
Sodium: 143 mEq/L (ref 137–147)

## 2013-09-29 SURGERY — ARTHROPLASTY, KNEE, TOTAL
Anesthesia: General | Laterality: Left

## 2013-09-29 MED ORDER — CEFUROXIME SODIUM 1.5 G IJ SOLR
INTRAMUSCULAR | Status: AC
Start: 2013-09-29 — End: 2013-09-29
  Filled 2013-09-29: qty 1.5

## 2013-09-29 MED ORDER — ONDANSETRON HCL 4 MG PO TABS: 4.0000 mg | ORAL_TABLET | Freq: Four times a day (QID) | ORAL | Status: DC | PRN

## 2013-09-29 MED ORDER — LIDOCAINE HCL (CARDIAC) 20 MG/ML IV SOLN
INTRAVENOUS | Status: DC | PRN
  Administered 2013-09-29: 60 mg via INTRAVENOUS

## 2013-09-29 MED ORDER — POLYETHYLENE GLYCOL 3350 17 G PO PACK: 17.0000 g | PACK | Freq: Every day | ORAL | Status: DC | PRN

## 2013-09-29 MED ORDER — PHENYLEPHRINE HCL 10 MG/ML IJ SOLN
INTRAMUSCULAR | Status: DC | PRN
  Administered 2013-09-29: 80 ug via INTRAVENOUS

## 2013-09-29 MED ORDER — LACTATED RINGERS IV SOLN
INTRAVENOUS | Status: DC
  Administered 2013-09-29 (×2): via INTRAVENOUS

## 2013-09-29 MED ORDER — BUPIVACAINE HCL (PF) 0.25 % IJ SOLN
INTRAMUSCULAR | Status: AC
  Filled 2013-09-29: qty 30

## 2013-09-29 MED ORDER — NITROGLYCERIN 0.4 MG SL SUBL: 0.4000 mg | SUBLINGUAL_TABLET | SUBLINGUAL | Status: DC | PRN

## 2013-09-29 MED ORDER — PROPOFOL 10 MG/ML IV BOLUS
INTRAVENOUS | Status: DC | PRN
  Administered 2013-09-29: 150 mg via INTRAVENOUS

## 2013-09-29 MED ORDER — DIPHENHYDRAMINE HCL 12.5 MG/5ML PO ELIX: 12.5000 mg | ORAL_SOLUTION | ORAL | Status: DC | PRN

## 2013-09-29 MED ORDER — DEXAMETHASONE SODIUM PHOSPHATE 10 MG/ML IJ SOLN
INTRAMUSCULAR | Status: AC
  Filled 2013-09-29: qty 1

## 2013-09-29 MED ORDER — FENTANYL CITRATE 0.05 MG/ML IJ SOLN
INTRAMUSCULAR | Status: AC
  Filled 2013-09-29: qty 5

## 2013-09-29 MED ORDER — METHOCARBAMOL 1000 MG/10ML IJ SOLN
500.0000 mg | Freq: Four times a day (QID) | INTRAVENOUS | Status: DC | PRN
  Filled 2013-09-29: qty 5

## 2013-09-29 MED ORDER — METHOCARBAMOL 500 MG PO TABS
500.0000 mg | ORAL_TABLET | Freq: Four times a day (QID) | ORAL | Status: DC | PRN
  Administered 2013-09-29: 500 mg via ORAL
  Filled 2013-09-29: qty 1

## 2013-09-29 MED ORDER — CEFAZOLIN SODIUM-DEXTROSE 2-3 GM-% IV SOLR
2.0000 g | Freq: Four times a day (QID) | INTRAVENOUS | Status: AC
  Administered 2013-09-29 – 2013-09-30 (×2): 2 g via INTRAVENOUS
  Filled 2013-09-29 (×2): qty 50

## 2013-09-29 MED ORDER — SODIUM CHLORIDE 0.9 % IR SOLN
Status: DC | PRN
  Administered 2013-09-29: 1

## 2013-09-29 MED ORDER — ALUM & MAG HYDROXIDE-SIMETH 200-200-20 MG/5ML PO SUSP: 30.0000 mL | ORAL | Status: DC | PRN

## 2013-09-29 MED ORDER — BUPIVACAINE LIPOSOME 1.3 % IJ SUSP
20.0000 mL | INTRAMUSCULAR | Status: AC
  Administered 2013-09-29: 20 mL
  Filled 2013-09-29: qty 20

## 2013-09-29 MED ORDER — MIDAZOLAM HCL 5 MG/5ML IJ SOLN
INTRAMUSCULAR | Status: DC | PRN
  Administered 2013-09-29: 2 mg via INTRAVENOUS

## 2013-09-29 MED ORDER — PROMETHAZINE HCL 25 MG/ML IJ SOLN: 6.2500 mg | Freq: Four times a day (QID) | INTRAMUSCULAR | Status: DC | PRN

## 2013-09-29 MED ORDER — DEXAMETHASONE SODIUM PHOSPHATE 10 MG/ML IJ SOLN
10.0000 mg | Freq: Three times a day (TID) | INTRAMUSCULAR | Status: AC
  Administered 2013-09-29 – 2013-09-30 (×2): 10 mg via INTRAVENOUS
  Filled 2013-09-29 (×2): qty 1

## 2013-09-29 MED ORDER — TRANEXAMIC ACID 100 MG/ML IV SOLN
1000.0000 mg | INTRAVENOUS | Status: DC
  Filled 2013-09-29: qty 10

## 2013-09-29 MED ORDER — DEXAMETHASONE 6 MG PO TABS
10.0000 mg | ORAL_TABLET | Freq: Three times a day (TID) | ORAL | Status: AC
  Administered 2013-09-29: 10 mg via ORAL
  Filled 2013-09-29 (×2): qty 1

## 2013-09-29 MED ORDER — FENTANYL CITRATE 0.05 MG/ML IJ SOLN
INTRAMUSCULAR | Status: DC | PRN
  Administered 2013-09-29: 50 ug via INTRAVENOUS
  Administered 2013-09-29 (×2): 100 ug via INTRAVENOUS

## 2013-09-29 MED ORDER — SIMVASTATIN 40 MG PO TABS
40.0000 mg | ORAL_TABLET | Freq: Every day | ORAL | Status: DC
  Administered 2013-09-29 – 2013-09-30 (×2): 40 mg via ORAL
  Filled 2013-09-29 (×2): qty 1

## 2013-09-29 MED ORDER — HYDROMORPHONE HCL PF 1 MG/ML IJ SOLN
0.5000 mg | INTRAMUSCULAR | Status: DC | PRN
Start: 2013-09-29 — End: 2013-09-30

## 2013-09-29 MED ORDER — ACETAMINOPHEN 325 MG PO TABS: 650.0000 mg | ORAL_TABLET | Freq: Four times a day (QID) | ORAL | Status: DC | PRN

## 2013-09-29 MED ORDER — MIDAZOLAM HCL 2 MG/2ML IJ SOLN
INTRAMUSCULAR | Status: AC
  Administered 2013-09-29: 2 mg
  Filled 2013-09-29: qty 2

## 2013-09-29 MED ORDER — ASPIRIN EC 325 MG PO TBEC
325.0000 mg | DELAYED_RELEASE_TABLET | Freq: Two times a day (BID) | ORAL | Status: DC
  Administered 2013-09-29 – 2013-09-30 (×2): 325 mg via ORAL
  Filled 2013-09-29 (×4): qty 1

## 2013-09-29 MED ORDER — FENTANYL CITRATE 0.05 MG/ML IJ SOLN
INTRAMUSCULAR | Status: AC
  Administered 2013-09-29: 100 ug
  Filled 2013-09-29: qty 2

## 2013-09-29 MED ORDER — MUPIROCIN 2 % EX OINT
TOPICAL_OINTMENT | Freq: Two times a day (BID) | CUTANEOUS | Status: DC
  Administered 2013-09-29 – 2013-09-30 (×2): via NASAL
  Filled 2013-09-29: qty 22

## 2013-09-29 MED ORDER — VANCOMYCIN HCL IN DEXTROSE 1-5 GM/200ML-% IV SOLN
1000.0000 mg | INTRAVENOUS | Status: DC
  Filled 2013-09-29: qty 200

## 2013-09-29 MED ORDER — ONDANSETRON HCL 4 MG/2ML IJ SOLN: 4.0000 mg | Freq: Four times a day (QID) | INTRAMUSCULAR | Status: DC | PRN

## 2013-09-29 MED ORDER — ACETAMINOPHEN 650 MG RE SUPP: 650.0000 mg | Freq: Four times a day (QID) | RECTAL | Status: DC | PRN

## 2013-09-29 MED ORDER — FENTANYL CITRATE 0.05 MG/ML IJ SOLN: 25.0000 ug | INTRAMUSCULAR | Status: DC | PRN

## 2013-09-29 MED ORDER — OXYCODONE-ACETAMINOPHEN 5-325 MG PO TABS
1.0000 | ORAL_TABLET | ORAL | Status: DC | PRN
  Administered 2013-09-29 – 2013-09-30 (×4): 2 via ORAL
  Filled 2013-09-29 (×4): qty 2

## 2013-09-29 MED ORDER — TRANEXAMIC ACID 100 MG/ML IV SOLN
1000.0000 mg | INTRAVENOUS | Status: DC | PRN
  Administered 2013-09-29: 1000 mg via INTRAVENOUS

## 2013-09-29 MED ORDER — OXYCODONE-ACETAMINOPHEN 5-325 MG PO TABS: 1.0000 | ORAL_TABLET | Freq: Four times a day (QID) | ORAL | Status: DC | PRN

## 2013-09-29 MED ORDER — SODIUM CHLORIDE 0.9 % IV SOLN
INTRAVENOUS | Status: DC
  Administered 2013-09-29: 100 mL/h via INTRAVENOUS
  Administered 2013-09-30: 06:00:00 via INTRAVENOUS

## 2013-09-29 MED ORDER — DOCUSATE SODIUM 100 MG PO CAPS
100.0000 mg | ORAL_CAPSULE | Freq: Two times a day (BID) | ORAL | Status: DC
  Administered 2013-09-29 – 2013-09-30 (×2): 100 mg via ORAL
  Filled 2013-09-29 (×2): qty 1

## 2013-09-29 MED ORDER — VANCOMYCIN HCL 1000 MG IV SOLR
1000.0000 mg | INTRAVENOUS | Status: DC | PRN
  Administered 2013-09-29: 1000 mg via INTRAVENOUS

## 2013-09-29 MED ORDER — BISACODYL 5 MG PO TBEC: 5.0000 mg | DELAYED_RELEASE_TABLET | Freq: Every day | ORAL | Status: DC | PRN

## 2013-09-29 MED ORDER — METOPROLOL TARTRATE 25 MG PO TABS
25.0000 mg | ORAL_TABLET | Freq: Two times a day (BID) | ORAL | Status: DC
  Administered 2013-09-29 – 2013-09-30 (×2): 25 mg via ORAL
  Filled 2013-09-29 (×3): qty 1

## 2013-09-29 MED ORDER — ONDANSETRON HCL 4 MG/2ML IJ SOLN
INTRAMUSCULAR | Status: AC
  Filled 2013-09-29: qty 2

## 2013-09-29 MED ORDER — MIDAZOLAM HCL 2 MG/2ML IJ SOLN
INTRAMUSCULAR | Status: AC
  Filled 2013-09-29: qty 2

## 2013-09-29 MED ORDER — PHENYLEPHRINE 40 MCG/ML (10ML) SYRINGE FOR IV PUSH (FOR BLOOD PRESSURE SUPPORT)
PREFILLED_SYRINGE | INTRAVENOUS | Status: AC
  Filled 2013-09-29: qty 10

## 2013-09-29 SURGICAL SUPPLY — 66 items
APL SKNCLS STERI-STRIP NONHPOA (GAUZE/BANDAGES/DRESSINGS) ×1
BANDAGE ESMARK 6X9 LF (GAUZE/BANDAGES/DRESSINGS) ×1 IMPLANT
BENZOIN TINCTURE PRP APPL 2/3 (GAUZE/BANDAGES/DRESSINGS) ×3 IMPLANT
BLADE SAGITTAL 25.0X1.19X90 (BLADE) ×2 IMPLANT
BLADE SAGITTAL 25.0X1.19X90MM (BLADE) ×1
BLADE SAW SAG 90X13X1.27 (BLADE) ×3 IMPLANT
BNDG CMPR 9X6 STRL LF SNTH (GAUZE/BANDAGES/DRESSINGS) ×1
BNDG ESMARK 6X9 LF (GAUZE/BANDAGES/DRESSINGS) ×3
BOWL SMART MIX CTS (DISPOSABLE) ×3 IMPLANT
CAPT RP KNEE ×2 IMPLANT
CEMENT HV SMART SET (Cement) ×6 IMPLANT
CLOSURE STERI-STRIP 1/2X4 (GAUZE/BANDAGES/DRESSINGS) ×1
CLOSURE WOUND 1/2 X4 (GAUZE/BANDAGES/DRESSINGS) ×1
CLSR STERI-STRIP ANTIMIC 1/2X4 (GAUZE/BANDAGES/DRESSINGS) ×1 IMPLANT
COVER SURGICAL LIGHT HANDLE (MISCELLANEOUS) ×3 IMPLANT
CUFF TOURNIQUET SINGLE 34IN LL (TOURNIQUET CUFF) ×3 IMPLANT
CUFF TOURNIQUET SINGLE 44IN (TOURNIQUET CUFF) IMPLANT
DRAPE EXTREMITY T 121X128X90 (DRAPE) ×3 IMPLANT
DRAPE U-SHAPE 47X51 STRL (DRAPES) ×3 IMPLANT
DRSG PAD ABDOMINAL 8X10 ST (GAUZE/BANDAGES/DRESSINGS) ×3 IMPLANT
DURAPREP 26ML APPLICATOR (WOUND CARE) ×3 IMPLANT
ELECT REM PT RETURN 9FT ADLT (ELECTROSURGICAL) ×3
ELECTRODE REM PT RTRN 9FT ADLT (ELECTROSURGICAL) ×1 IMPLANT
EVACUATOR 1/8 PVC DRAIN (DRAIN) ×3 IMPLANT
FACESHIELD WRAPAROUND (MASK) ×3 IMPLANT
FACESHIELD WRAPAROUND OR TEAM (MASK) ×1 IMPLANT
GAUZE XEROFORM 5X9 LF (GAUZE/BANDAGES/DRESSINGS) ×3 IMPLANT
GLOVE BIOGEL PI IND STRL 8 (GLOVE) ×2 IMPLANT
GLOVE BIOGEL PI INDICATOR 8 (GLOVE) ×4
GLOVE ECLIPSE 7.5 STRL STRAW (GLOVE) ×6 IMPLANT
GOWN STRL REUS W/ TWL LRG LVL3 (GOWN DISPOSABLE) ×1 IMPLANT
GOWN STRL REUS W/ TWL XL LVL3 (GOWN DISPOSABLE) ×2 IMPLANT
GOWN STRL REUS W/TWL LRG LVL3 (GOWN DISPOSABLE) ×3
GOWN STRL REUS W/TWL XL LVL3 (GOWN DISPOSABLE) ×6
HANDPIECE INTERPULSE COAX TIP (DISPOSABLE) ×3
HOOD PEEL AWAY FACE SHEILD DIS (HOOD) ×7 IMPLANT
IMMOBILIZER KNEE 20 (SOFTGOODS) IMPLANT
IMMOBILIZER KNEE 22 UNIV (SOFTGOODS) ×3 IMPLANT
KIT BASIN OR (CUSTOM PROCEDURE TRAY) ×3 IMPLANT
KIT ROOM TURNOVER OR (KITS) ×3 IMPLANT
MANIFOLD NEPTUNE II (INSTRUMENTS) ×3 IMPLANT
NDL HYPO 25GX1X1/2 BEV (NEEDLE) IMPLANT
NEEDLE HYPO 25GX1X1/2 BEV (NEEDLE) IMPLANT
NS IRRIG 1000ML POUR BTL (IV SOLUTION) ×3 IMPLANT
PACK TOTAL JOINT (CUSTOM PROCEDURE TRAY) ×3 IMPLANT
PAD ABD 8X10 STRL (GAUZE/BANDAGES/DRESSINGS) ×3 IMPLANT
PAD ARMBOARD 7.5X6 YLW CONV (MISCELLANEOUS) ×6 IMPLANT
PAD CAST 4YDX4 CTTN HI CHSV (CAST SUPPLIES) ×1 IMPLANT
PADDING CAST COTTON 4X4 STRL (CAST SUPPLIES) ×3
SET HNDPC FAN SPRY TIP SCT (DISPOSABLE) ×1 IMPLANT
SPONGE GAUZE 4X4 12PLY (GAUZE/BANDAGES/DRESSINGS) ×3 IMPLANT
SPONGE GAUZE 4X4 12PLY STER LF (GAUZE/BANDAGES/DRESSINGS) ×2 IMPLANT
STAPLER VISISTAT 35W (STAPLE) IMPLANT
STRIP CLOSURE SKIN 1/2X4 (GAUZE/BANDAGES/DRESSINGS) ×2 IMPLANT
SUCTION FRAZIER TIP 10 FR DISP (SUCTIONS) ×3 IMPLANT
SUT MNCRL AB 3-0 PS2 18 (SUTURE) IMPLANT
SUT VIC AB 0 CTB1 27 (SUTURE) ×6 IMPLANT
SUT VIC AB 1 CT1 27 (SUTURE) ×6
SUT VIC AB 1 CT1 27XBRD ANBCTR (SUTURE) ×2 IMPLANT
SUT VIC AB 2-0 CTB1 (SUTURE) ×6 IMPLANT
SYR 50ML LL SCALE MARK (SYRINGE) ×3 IMPLANT
SYR CONTROL 10ML LL (SYRINGE) ×2 IMPLANT
TOWEL OR 17X24 6PK STRL BLUE (TOWEL DISPOSABLE) ×3 IMPLANT
TOWEL OR 17X26 10 PK STRL BLUE (TOWEL DISPOSABLE) ×3 IMPLANT
TRAY FOLEY CATH 16FRSI W/METER (SET/KITS/TRAYS/PACK) ×3 IMPLANT
WATER STERILE IRR 1000ML POUR (IV SOLUTION) ×6 IMPLANT

## 2013-09-29 NOTE — Progress Notes (Signed)
Utilization review completed.  

## 2013-09-29 NOTE — Anesthesia Postprocedure Evaluation (Signed)
  Anesthesia Post-op Note  Patient: David Little  Procedure(s) Performed: Procedure(s): TOTAL KNEE ARTHROPLASTY (Left)  Patient Location: PACU  Anesthesia Type:General  Level of Consciousness: awake, alert  and oriented  Airway and Oxygen Therapy: Patient Spontanous Breathing and Patient connected to nasal cannula oxygen  Post-op Pain: mild  Post-op Assessment: Post-op Vital signs reviewed  Post-op Vital Signs: Reviewed  Last Vitals:  Filed Vitals:   09/29/13 1815  BP:   Pulse: 71  Temp:   Resp: 17    Complications: No apparent anesthesia complications

## 2013-09-29 NOTE — Brief Op Note (Signed)
09/29/2013  4:53 PM  PATIENT:  David Little  78 y.o. male  PRE-OPERATIVE DIAGNOSIS:  osteoarthritis left knee  POST-OPERATIVE DIAGNOSIS:  same  PROCEDURE:  Procedure(s): TOTAL KNEE ARTHROPLASTY (Left)  SURGEON:  Surgeon(s) and Role:    * Alta Corning, MD - Primary  PHYSICIAN ASSISTANT:   ASSISTANTS: bethune   ANESTHESIA:   general  EBL:  Total I/O In: 1600 [I.V.:1600] Out: -   BLOOD ADMINISTERED:none  DRAINS: (1) Hemovact drain(s) in the l knee with  Suction Open   LOCAL MEDICATIONS USED:  OTHER experel  SPECIMEN:  No Specimen  DISPOSITION OF SPECIMEN:  N/A  COUNTS:  YES  TOURNIQUET:   Total Tourniquet Time Documented: Thigh (Left) - 57 minutes Total: Thigh (Left) - 57 minutes   DICTATION: .Other Dictation: Dictation Number [id number]  PLAN OF CARE: Admit to inpatient   PATIENT DISPOSITION:  PACU - hemodynamically stable.   Delay start of Pharmacological VTE agent (>24hrs) due to surgical blood loss or risk of bleeding: no

## 2013-09-29 NOTE — Progress Notes (Signed)
Report given to rebecca slade rn as caregiver

## 2013-09-29 NOTE — Anesthesia Procedure Notes (Signed)
Anesthesia Regional Block:  Femoral nerve block  Pre-Anesthetic Checklist: ,, timeout performed, Correct Patient, Correct Site, Correct Laterality, Correct Procedure, Correct Position, site marked, Risks and benefits discussed,  Surgical consent,  Pre-op evaluation,  At surgeon's request and post-op pain management  Laterality: Left  Prep: chloraprep       Needles:   Needle Type: Echogenic Stimulator Needle          Additional Needles:  Procedures: Doppler guided, ultrasound guided (picture in chart) and nerve stimulator Femoral nerve block  Nerve Stimulator or Paresthesia:  Response: 0.5 mA,   Additional Responses:   Narrative:  Start time: 09/29/2013 11:05 AM End time: 09/29/2013 11:20 AM Injection made incrementally with aspirations every 5 mL.  Performed by: Personally  Anesthesiologist: Dr. Oletta Lamas

## 2013-09-29 NOTE — H&P (Signed)
TOTAL KNEE ADMISSION H&P  Patient is being admitted for left total knee arthroplasty.  Subjective:  Chief Complaint:left knee pain.  HPI: David Little, 78 y.o. male, has a history of pain and functional disability in the left knee due to arthritis and has failed non-surgical conservative treatments for greater than 12 weeks to includeNSAID's and/or analgesics, corticosteriod injections, use of assistive devices, weight reduction as appropriate and activity modification.  Onset of symptoms was gradual, starting 5 years ago with gradually worsening course since that time. The patient noted no past surgery on the left knee(s).  Patient currently rates pain in the left knee(s) at 8 out of 10 with activity. Patient has night pain, worsening of pain with activity and weight bearing, pain that interferes with activities of daily living, pain with passive range of motion, crepitus and joint swelling.  Patient has evidence of subchondral sclerosis, joint subluxation and joint space narrowing by imaging studies. This patient has had failure of conservative care. There is no active infection.  Patient Active Problem List   Diagnosis Date Noted  . CAD (coronary artery disease) 01/07/2011  . Hyperlipidemia 01/07/2011   Past Medical History  Diagnosis Date  . Hyperlipidemia   . Hypercholesterolemia   . Coronary artery disease     s/p CABG in 2007 for severe left main disease  . Normal nuclear stress test March 2012    EF 55%. No ischemia. Mild inferior scar  . Myocardial infarction   . Lymphoma 2003    without recurrence  . Skin cancer of face     Past Surgical History  Procedure Laterality Date  . Coronary artery bypass graft  2007    Had coronary artery bypass grafting for severe left main coronary artery stenosis--Had follow up stress cardiolite study in February 2010 which showed EF of 55% with mild inferior hypokinesia--There was no ischemia    Prescriptions prior to admission   Medication Sig Dispense Refill  . aspirin 325 MG tablet Take 325 mg by mouth daily.        Marland Kitchen ibuprofen (ADVIL,MOTRIN) 800 MG tablet Take 800 mg by mouth every 8 (eight) hours as needed for fever, headache or mild pain.       . metoprolol tartrate (LOPRESSOR) 25 MG tablet Take 1 tablet (25 mg total) by mouth 2 (two) times daily.  180 tablet  3  . Multiple Vitamin (MULTIVITAMIN PO) Take by mouth daily.        . naproxen sodium (ANAPROX) 220 MG tablet Take 220 mg by mouth 2 (two) times daily as needed (for pain).      . simvastatin (ZOCOR) 40 MG tablet Take 1 tablet (40 mg total) by mouth daily.  90 tablet  3  . nitroGLYCERIN (NITROSTAT) 0.4 MG SL tablet Place 0.4 mg under the tongue as needed.         No Known Allergies  History  Substance Use Topics  . Smoking status: Former Smoker -- 1.00 packs/day for 30 years    Quit date: 09/22/1973  . Smokeless tobacco: Former Systems developer    Types: Chew  . Alcohol Use: No    History reviewed. No pertinent family history.   ROS ROS: I have reviewed the patient's review of systems thoroughly and there are no positive responses as relates to the HPI.  Objective:  Physical Exam  Vital signs in last 24 hours: Temp:  [97.8 F (36.6 C)] 97.8 F (36.6 C) (05/20 1017) Pulse Rate:  [61] 61 (05/20 1017) Resp:  [  20] 20 (05/20 1017) BP: (122)/(61) 122/61 mmHg (05/20 1017) SpO2:  [99 %] 99 % (05/20 1017) Well-developed well-nourished patient in no acute distress. Alert and oriented x3 HEENT:within normal limits Cardiac: Regular rate and rhythm Pulmonary: Lungs clear to auscultation Abdomen: Soft and nontender.  Normal active bowel sounds  Musculoskeletal: (l knee painful rom//+ttp over med side AROM 0-105 Labs: Recent Results (from the past 2160 hour(s))  LIPID PANEL     Status: None   Collection Time    07/19/13  8:22 AM      Result Value Ref Range   Cholesterol, Total 148  100 - 199 mg/dL   Triglycerides 83  0 - 149 mg/dL   HDL 58  >39 mg/dL    Comment: According to ATP-III Guidelines, HDL-C >59 mg/dL is considered a     negative risk factor for CHD.   VLDL Cholesterol Cal 17  5 - 40 mg/dL   LDL Calculated 73  0 - 99 mg/dL   Chol/HDL Ratio 2.6  0.0 - 5.0 ratio units   Comment:                                   T. Chol/HDL Ratio                                                 Men  Women                                   1/2 Avg.Risk  3.4    3.3                                       Avg.Risk  5.0    4.4                                    2X Avg.Risk  9.6    7.1                                    3X Avg.Risk 23.4   11.0  HEPATIC FUNCTION PANEL     Status: None   Collection Time    07/19/13  8:22 AM      Result Value Ref Range   Total Protein 6.1  6.0 - 8.5 g/dL   Albumin 4.3  3.5 - 4.8 g/dL   Total Bilirubin 0.8  0.0 - 1.2 mg/dL   Bilirubin, Direct 0.21  0.00 - 0.40 mg/dL   Alkaline Phosphatase 55  39 - 117 IU/L   AST 21  0 - 40 IU/L   ALT 11  0 - 44 IU/L  SURGICAL PCR SCREEN     Status: Abnormal   Collection Time    09/22/13 10:57 AM      Result Value Ref Range   MRSA, PCR POSITIVE (*) NEGATIVE   Staphylococcus aureus POSITIVE (*) NEGATIVE   Comment:            The Xpert SA Assay (  FDA     approved for NASAL specimens     in patients over 63 years of age),     is one component of     a comprehensive surveillance     program.  Test performance has     been validated by [employer] for patients greater     than or equal to 44 year old.     It is not intended     to diagnose infection nor to     guide or monitor treatment.  APTT     Status: None   Collection Time    09/22/13 10:58 AM      Result Value Ref Range   aPTT 31  24 - 37 seconds  BASIC METABOLIC PANEL     Status: Abnormal   Collection Time    09/22/13 10:58 AM      Result Value Ref Range   Sodium 144  137 - 147 mEq/L   Potassium 5.5 (*) 3.7 - 5.3 mEq/L   Chloride 109  96 - 112 mEq/L   CO2 26  19 - 32 mEq/L   Glucose, Bld 82  70 - 99 mg/dL   BUN  19  6 - 23 mg/dL   Creatinine, Ser 1.09  0.50 - 1.35 mg/dL   Calcium 9.5  8.4 - 10.5 mg/dL   GFR calc non Af Amer 64 (*) >90 mL/min   GFR calc Af Amer 75 (*) >90 mL/min   Comment: (NOTE)     The eGFR has been calculated using the CKD EPI equation.     This calculation has not been validated in all clinical situations.     eGFR's persistently <90 mL/min signify possible Chronic Kidney     Disease.  CBC     Status: None   Collection Time    09/22/13 10:58 AM      Result Value Ref Range   WBC 8.5  4.0 - 10.5 K/uL   RBC 4.77  4.22 - 5.81 MIL/uL   Hemoglobin 14.0  13.0 - 17.0 g/dL   HCT 41.8  39.0 - 52.0 %   MCV 87.6  78.0 - 100.0 fL   MCH 29.4  26.0 - 34.0 pg   MCHC 33.5  30.0 - 36.0 g/dL   RDW 13.4  11.5 - 15.5 %   Platelets 178  150 - 400 K/uL  PROTIME-INR     Status: None   Collection Time    09/22/13 10:58 AM      Result Value Ref Range   Prothrombin Time 14.1  11.6 - 15.2 seconds   INR 1.11  0.00 - 1.49  DIFFERENTIAL     Status: None   Collection Time    09/22/13 10:58 AM      Result Value Ref Range   Neutrophils Relative % 52  43 - 77 %   Neutro Abs 4.4  1.7 - 7.7 K/uL   Lymphocytes Relative 38  12 - 46 %   Lymphs Abs 3.2  0.7 - 4.0 K/uL   Monocytes Relative 6  3 - 12 %   Monocytes Absolute 0.5  0.1 - 1.0 K/uL   Eosinophils Relative 4  0 - 5 %   Eosinophils Absolute 0.3  0.0 - 0.7 K/uL   Basophils Relative 0  0 - 1 %   Basophils Absolute 0.0  0.0 - 0.1 K/uL  TYPE AND SCREEN     Status: None   Collection  Time    09/22/13 11:02 AM      Result Value Ref Range   ABO/RH(D) O NEG     Antibody Screen NEG     Sample Expiration 10/06/2013    URINALYSIS, ROUTINE W REFLEX MICROSCOPIC     Status: None   Collection Time    09/22/13 11:06 AM      Result Value Ref Range   Color, Urine YELLOW  YELLOW   APPearance CLEAR  CLEAR   Specific Gravity, Urine 1.026  1.005 - 1.030   pH 5.5  5.0 - 8.0   Glucose, UA NEGATIVE  NEGATIVE mg/dL   Hgb urine dipstick NEGATIVE  NEGATIVE    Bilirubin Urine NEGATIVE  NEGATIVE   Ketones, ur NEGATIVE  NEGATIVE mg/dL   Protein, ur NEGATIVE  NEGATIVE mg/dL   Urobilinogen, UA 0.2  0.0 - 1.0 mg/dL   Nitrite NEGATIVE  NEGATIVE   Leukocytes, UA NEGATIVE  NEGATIVE   Comment: MICROSCOPIC NOT DONE ON URINES WITH NEGATIVE PROTEIN, BLOOD, LEUKOCYTES, NITRITE, OR GLUCOSE <1000 mg/dL.  HEPATIC FUNCTION PANEL     Status: None   Collection Time    09/22/13 11:09 AM      Result Value Ref Range   Total Protein 6.7  6.0 - 8.3 g/dL   Albumin 4.1  3.5 - 5.2 g/dL   AST 22  0 - 37 U/L   ALT 11  0 - 53 U/L   Alkaline Phosphatase 54  39 - 117 U/L   Total Bilirubin 0.7  0.3 - 1.2 mg/dL   Bilirubin, Direct <0.2  0.0 - 0.3 mg/dL   Indirect Bilirubin NOT CALCULATED  0.3 - 0.9 mg/dL  POCT I-STAT 4, (NA,K, GLUC, HGB,HCT)     Status: None   Collection Time    09/29/13 10:22 AM      Result Value Ref Range   Sodium 143  137 - 147 mEq/L   Potassium 4.2  3.7 - 5.3 mEq/L   Glucose, Bld 97  70 - 99 mg/dL   HCT 42.0  39.0 - 52.0 %   Hemoglobin 14.3  13.0 - 17.0 g/dL     Estimated body mass index is 26.11 kg/(m^2) as calculated from the following:   Height as of 08/30/13: 5' 10"  (1.778 m).   Weight as of 08/30/13: 182 lb (82.555 kg).   Imaging Review Plain radiographs demonstrate severe degenerative joint disease of the left knee(s). The overall alignment ismild varus. The bone quality appears to be good for age and reported activity level.  Assessment/Plan:  End stage arthritis, left knee   The patient history, physical examination, clinical judgment of the provider and imaging studies are consistent with end stage degenerative joint disease of the left knee(s) and total knee arthroplasty is deemed medically necessary. The treatment options including medical management, injection therapy arthroscopy and arthroplasty were discussed at length. The risks and benefits of total knee arthroplasty were presented and reviewed. The risks due to aseptic  loosening, infection, stiffness, patella tracking problems, thromboembolic complications and other imponderables were discussed. The patient acknowledged the explanation, agreed to proceed with the plan and consent was signed. Patient is being admitted for inpatient treatment for surgery, pain control, PT, OT, prophylactic antibiotics, VTE prophylaxis, progressive ambulation and ADL's and discharge planning. The patient is planning to be discharged home with home health services

## 2013-09-29 NOTE — Progress Notes (Signed)
Orthopedic Tech Progress Note Patient Details:  David Little July 13, 1935 [id number]  CPM Left Knee CPM Left Knee: On Left Knee Flexion (Degrees): 60 Left Knee Extension (Degrees): 0 Additional Comments: applied overhead frame and footsie roll on bed  Ortho Devices Ortho Device/Splint Location: footsie roll Ortho Device/Splint Interventions: Ordered   Braulio Bosch 09/29/2013, 3:28 PM

## 2013-09-29 NOTE — Anesthesia Preprocedure Evaluation (Signed)
Anesthesia Evaluation  Patient identified by MRN, date of birth, ID band Patient awake    Reviewed: Allergy & Precautions, H&P , NPO status , Patient's Chart, lab work & pertinent test results, reviewed documented beta blocker date and time   Airway Mallampati: II      Dental  (+) Poor Dentition, Chipped   Pulmonary former smoker,          Cardiovascular + CAD and + Past MI Rhythm:Regular     Neuro/Psych    GI/Hepatic   Endo/Other    Renal/GU      Musculoskeletal   Abdominal   Peds  Hematology   Anesthesia Other Findings   Reproductive/Obstetrics                           Anesthesia Physical Anesthesia Plan  ASA: III  Anesthesia Plan: General   Post-op Pain Management: MAC Combined w/ Regional for Post-op pain   Induction: Intravenous  Airway Management Planned: LMA  Additional Equipment:   Intra-op Plan:   Post-operative Plan:   Informed Consent: I have reviewed the patients History and Physical, chart, labs and discussed the procedure including the risks, benefits and alternatives for the proposed anesthesia with the patient or authorized representative who has indicated his/her understanding and acceptance.   Dental advisory given  Plan Discussed with: Anesthesiologist, CRNA and Surgeon  Anesthesia Plan Comments:         Anesthesia Quick Evaluation

## 2013-09-29 NOTE — Discharge Instructions (Signed)
Total Knee Replacement Care After Refer to this sheet in the next few weeks. These instructions provide you with information on caring for yourself after your procedure. Your caregiver also may give you specific instructions. Your treatment has been planned according to the most current medical practices, but problems sometimes occur. Call your caregiver if you have any problems or questions after your procedure. HOME CARE INSTRUCTIONS   See a physical therapist as directed by your caregiver.  Take over-the-counter or prescription medicines for pain, discomfort, or fever only as directed by your caregiver.  Avoid lifting or driving until you are instructed otherwise.  If you have been sent home with a continuous passive motion machine, use it as directed by your caregiver. SEEK MEDICAL CARE IF:  You have difficulty breathing.  Your wound is red, swollen, or has become increasingly painful.  You have pus draining from your wound.  You have a bad smell coming from your wound.  You have persistent bleeding from your wound.  Your wound breaks open after sutures (stitches) or staples have been removed. SEEK IMMEDIATE MEDICAL CARE IF:   You have a fever.  You have a rash.  You have pain or swelling in your calf or thigh.  You have shortness of breath or chest pain.  Your range of motion in your knee is decreasing rather than increasing. MAKE SURE YOU:   Understand these instructions.  Will watch your condition.  Will get help right away if you are not doing well or get worse. Document Released: 11/16/2004 Document Revised: 10/29/2011 Document Reviewed: 06/18/2011 [facility] Patient Information 2014 [employer].

## 2013-09-29 NOTE — Progress Notes (Signed)
Ate 75 % of dinner tray

## 2013-09-30 ENCOUNTER — Encounter (HOSPITAL_COMMUNITY): Payer: Self-pay | Admitting: Orthopedic Surgery

## 2013-09-30 LAB — BASIC METABOLIC PANEL
BUN: 14 mg/dL (ref 6–23)
CHLORIDE: 106 meq/L (ref 96–112)
CO2: 21 mEq/L (ref 19–32)
Calcium: 8.9 mg/dL (ref 8.4–10.5)
Creatinine, Ser: 0.96 mg/dL (ref 0.50–1.35)
GFR, EST NON AFRICAN AMERICAN: 79 mL/min — AB (ref >90)
Glucose, Bld: 159 mg/dL — ABNORMAL HIGH (ref 70–99)
POTASSIUM: 4.7 meq/L (ref 3.7–5.3)
Sodium: 144 mEq/L (ref 137–147)

## 2013-09-30 LAB — CBC
HCT: 39.1 % (ref 39.0–52.0)
Hemoglobin: 13.2 g/dL (ref 13.0–17.0)
MCH: 29.6 pg (ref 26.0–34.0)
MCHC: 33.8 g/dL (ref 30.0–36.0)
MCV: 87.7 fL (ref 78.0–100.0)
Platelets: 182 10*3/uL (ref 150–400)
RBC: 4.46 MIL/uL (ref 4.22–5.81)
RDW: 13.3 % (ref 11.5–15.5)
WBC: 11.9 10*3/uL — ABNORMAL HIGH (ref 4.0–10.5)

## 2013-09-30 MED ORDER — OXYCODONE-ACETAMINOPHEN 5-325 MG PO TABS: 1.0000 | ORAL_TABLET | Freq: Four times a day (QID) | ORAL | Status: DC | PRN

## 2013-09-30 MED ORDER — ASPIRIN 325 MG PO TBEC: 325.0000 mg | DELAYED_RELEASE_TABLET | Freq: Two times a day (BID) | ORAL | Status: DC

## 2013-09-30 NOTE — Transfer of Care (Signed)
Immediate Anesthesia Transfer of Care Note  Patient: David Little  Procedure(s) Performed: Procedure(s): TOTAL KNEE ARTHROPLASTY (Left)  Patient Location: PACU  Anesthesia Type:General  Level of Consciousness: awake, alert  and oriented  Airway & Oxygen Therapy: Patient Spontanous Breathing and Patient connected to nasal cannula oxygen  Post-op Assessment: Report given to PACU RN and Post -op Vital signs reviewed and stable  Post vital signs: Reviewed and stable  Complications: No apparent anesthesia complications

## 2013-09-30 NOTE — Op Note (Signed)
NAMEMarland Kitchen  Little, David Little NO.:  [id number]  MEDICAL RECORD NO.:  [id number]  LOCATION:  5N19C                        FACILITY:  [facility]  PHYSICIAN:  Alta Corning, M.D.   DATE OF BIRTH:  04/02/1936  DATE OF PROCEDURE:  09/29/2013 DATE OF DISCHARGE:                              OPERATIVE REPORT   PREOPERATIVE DIAGNOSIS:  End-stage degenerative joint disease, left knee.  POSTOPERATIVE DIAGNOSIS:  End-stage degenerative joint disease, left knee.  PROCEDURE:  Left total knee replacement with a Sigma system, size 4 femur, size 4 tibia, 12.5-mm bridging bearing, and a 38-mm all- polyethylene patella.  SURGEON:  Alta Corning, M.D.  ASSISTANT:  Gary Fleet, P.A.  ANESTHESIA:  General.  BRIEF HISTORY:  David Little is a 78 year old male with a long history of having severe arthritis of the left knee.  He has severe varus malalignment and rotational malalignment.  We saw him in the office and felt that the only reasonable course of action, given the severity of his problem, was total knee replacement after failure of all conservative care.  He was taken to the operating room for left total knee replacement.  DESCRIPTION OF PROCEDURE:  The patient was taken to the operating room after adequate anesthesia was obtained with general anesthetic.  The patient was placed supine on the operating table.  Left leg was prepped and draped in usual sterile fashion.  Following this, a midline incision was made in the subcutaneous tissues down to the level of extensor mechanism and a medial parapatellar arthrotomy was undertaken.  Once this was completed, attention was turned to the left knee where medial and lateral meniscus were removed, retropatellar fat pad, and synovium in the anterior aspect of the femur, and the anterior and posterior cruciates.  Once this was done, retractors were put in place and the tibia was then cut perpendicular to its long axis with an  extramedullary guide.  Once this was done, attention was turned to the femur where an intramedullary pilot hole was drilled and the long guide rod was placed and the distal femur was then cut, 10 mm of distal femur was cut with 5- degree valgus inclination.  Once this was done, __________ was put in place __________ was going to be a better choice.  We then measured the femur to a 4, did anterior-posterior cuts, chamfer and box.  Measured the tibia to a 4, sized it to a 4, it was drilled and keeled.  Trial components were put in place and a 12.5 bridging bearing was placed and knee put through a range of motion and excellent stability and range of motion were achieved.  Great gap balance.  No rotational malalignment. Attention was turned to the patella, cut down to a level of 15 mm and a 38 paddle was chosen, and the lugs were drilled __________ lugs drilled in the femur.  At this point, all trial components were removed.  Knee was copiously and thoroughly lavaged, suctioned dry.  Final components were cemented in place, size 4 femur, size 4 tibia, 12-mm bridging bearing trial was placed, and a 38-mm all-polyethylene patella was put in place and __________ clamped.  All excess bone cement was  removed. Then, the cement was allowed to completely harden.  Once this was done, the tourniquet was let down.  All bleeding was controlled with electrocautery and at this point, medium __________ drain was placed. The medial parapatellar arthrotomy was closed with 1 Vicryl running, skin with 0 and 2-0 Vicryl, and 3-0 Monocryl subcuticular.  Benzoin and Steri- Strips were applied.  Sterile compressive dressing was applied, and the patient was taken to the recovery room and was noted to be in satisfactory condition.  Estimated blood loss for the procedure was minimal.     Alta Corning, M.D.     Corliss Skains  D:  09/29/2013  T:  09/30/2013  Job:  [id number]

## 2013-09-30 NOTE — Evaluation (Signed)
Physical Therapy Evaluation Patient Details Name: David Little MRN: [id number] DOB: 12/25/1935 Today's Date: 09/30/2013   History of Present Illness  78 y.o. male s/p left total knee arthroplasty with history of CAD.  Clinical Impression  Pt is s/p left TKA resulting in the deficits listed below (see PT Problem List). Pt ambulates up to 60 feet with min assist to block left knee from buckling at all times while wearing knee immobilizer. Pt will benefit from skilled PT to increase their independence and safety with mobility to allow discharge to the venue listed below.      Follow Up Recommendations Home health PT;Supervision/Assistance - 24 hour    Equipment Recommendations  None recommended by PT    Recommendations for Other Services OT consult     Precautions / Restrictions Precautions Precautions: Knee Precaution Comments: Reviewed precautions Required Braces or Orthoses: Knee Immobilizer - Left Knee Immobilizer - Left: On when out of bed or walking Restrictions Weight Bearing Restrictions: Yes LLE Weight Bearing: Weight bearing as tolerated      Mobility  Bed Mobility               General bed mobility comments: Pt in chair at start of therapy  Transfers Overall transfer level: Needs assistance Equipment used: Rolling walker (2 wheeled) Transfers: Sit to/from Stand Sit to Stand: Min assist         General transfer comment: Min assist to steady balance and RW upon standing. Demonstrates correct hand placement. Decreased WB in lLE with transition from sit to stand position  Ambulation/Gait Ambulation/Gait assistance: Min assist Ambulation Distance (Feet): 60 Feet Assistive device: Rolling walker (2 wheeled) Gait Pattern/deviations: Step-to pattern;Decreased step length - right;Decreased stance time - left;Decreased stride length;Antalgic Gait velocity: decreased   General Gait Details: Significant knee instability on left with knee immobilizer in  place. Requires min assist to block knee from buckling at all times. However, has good arm strength and is able to prevent himself from loss of balance. Cues for sequencing and to extend left knee during stance phase with quad activation  Stairs            Wheelchair Mobility    Modified Rankin (Stroke Patients Only)       Balance Overall balance assessment: Needs assistance Sitting-balance support: No upper extremity supported;Feet supported Sitting balance-Leahy Scale: Good Sitting balance - Comments: good limits of stability in sitting   Standing balance support: Bilateral upper extremity supported Standing balance-Leahy Scale: Poor Standing balance comment: Bilateral upper extremity support to stand                             Pertinent Vitals/Pain 0/10 pain Patient repositioned in chair for comfort.     Home Living Family/patient expects to be discharged to:: Private residence Living Arrangements: Spouse/significant other Available Help at Discharge: Family;Available 24 hours/day Type of Home: House Home Access: Ramped entrance;Stairs to enter Entrance Stairs-Rails: None Entrance Stairs-Number of Steps: 1 Home Layout: One level Home Equipment: Walker - 2 wheels;Bedside commode (with 3-in 1)      Prior Function Level of Independence: Independent               Hand Dominance   Dominant Hand: Right    Extremity/Trunk Assessment   Upper Extremity Assessment: Defer to OT evaluation           Lower Extremity Assessment: LLE deficits/detail   LLE Deficits / Details: decreased strength and  rom     Communication   Communication: No difficulties  Cognition Arousal/Alertness: Awake/alert Behavior During Therapy: WFL for tasks assessed/performed Overall Cognitive Status: Within Functional Limits for tasks assessed                      General Comments General comments (skin integrity, edema, etc.): reviewed precautions and  therapeutic exercise. discussed role of PT and need for HHPT upon d/c.    Exercises Total Joint Exercises Ankle Circles/Pumps: AROM;Both;10 reps;Seated Quad Sets: AROM;Left;10 reps;Supine Long Arc Quad: AAROM;Left;10 reps;Seated      Assessment/Plan    PT Assessment Patient needs continued PT services  PT Diagnosis Difficulty walking;Abnormality of gait;Acute pain   PT Problem List Decreased strength;Decreased range of motion;Decreased activity tolerance;Decreased balance;Decreased mobility;Decreased knowledge of use of DME;Decreased knowledge of precautions;Pain  PT Treatment Interventions DME instruction;Gait training;Stair training;Functional mobility training;Therapeutic activities;Therapeutic exercise;Balance training;Neuromuscular re-education;Patient/family education;Modalities   PT Goals (Current goals can be found in the Care Plan section) Acute Rehab PT Goals Patient Stated Goal: go home PT Goal Formulation: With patient Time For Goal Achievement: 10/07/13 Potential to Achieve Goals: Good    Frequency 7X/week   Barriers to discharge        Co-evaluation               End of Session Equipment Utilized During Treatment: Gait belt Activity Tolerance: Patient tolerated treatment well Patient left: in chair;with call bell/phone within reach;with nursing/sitter in room Nurse Communication: Mobility status         Time: 2671-2458 PT Time Calculation (min): 29 min   Charges:   PT Evaluation $Initial PT Evaluation Tier I: 1 Procedure PT Treatments $Gait Training: 8-22 mins   PT G CodesCamille Bal [place], [place] 09/30/2013, 9:57 AM

## 2013-09-30 NOTE — Progress Notes (Signed)
Subjective: 1 Day Post-Op Procedure(s) (LRB): TOTAL KNEE ARTHROPLASTY (Left) Patient reports pain as 2 on 0-10 scale.  The patient is taking by mouth and voiding well.  He wants to go home this afternoon.  Objective: Vital signs in last 24 hours: Temp:  [97.6 F (36.4 C)-98.1 F (36.7 C)] 98 F (36.7 C) (05/21 0300) Pulse Rate:  [48-76] 63 (05/21 1055) Resp:  [6-22] 18 (05/21 0300) BP: (105-156)/(53-79) 112/59 mmHg (05/21 1055) SpO2:  [82 %-100 %] 100 % (05/21 0300) Weight:  [82.6 kg (182 lb 1.6 oz)] 82.6 kg (182 lb 1.6 oz) (05/20 1815)  Intake/Output from previous day: 05/20 0701 - 05/21 0700 In: 2240 [P.O.:240; I.V.:2000] Out: 2350 [Urine:2000; Drains:350] Intake/Output this shift: Total I/O In: 493 [P.O.:360; I.V.:133] Out: 100 [Urine:100]   Recent Labs  09/29/13 1022 09/30/13 0615  HGB 14.3 13.2    Recent Labs  09/29/13 1022 09/30/13 0615  WBC  --  11.9*  RBC  --  4.46  HCT 42.0 39.1  PLT  --  182    Recent Labs  09/29/13 1022 09/30/13 0615  NA 143 144  K 4.2 4.7  CL  --  106  CO2  --  21  BUN  --  14  CREATININE  --  0.96  GLUCOSE 97 159*  CALCIUM  --  8.9   Left knee exam: Neurovascular intact Sensation intact distally Intact pulses distally Dorsiflexion/Plantar flexion intact Incision: dressing C/D/I Compartment soft  Assessment/Plan: 1 Day Post-Op Procedure(s) (LRB): TOTAL KNEE ARTHROPLASTY (Left) Plan: Discharge home with home health after physical therapy. Drain pulled.  Left knee dressing will be changed by nursing staff. .  Followup with Dr. Berenice Primas in 2 weeks. Erlene Senters 09/30/2013, 2:03 PM

## 2013-09-30 NOTE — Progress Notes (Signed)
Physical Therapy Treatment Patient Details Name: David Little MRN: [id number] DOB: 1935-12-18 Today's Date: 09/30/2013    History of Present Illness 78 y.o. male s/p left total knee arthroplasty with history of CAD.    PT Comments    Pt progressing towards physical therapy goals. Ambulates with improved knee stability up to 60 feet at min guard level, intermittent buckling of left knee but able to self correct. Pt declined stair training; suggested this be done for safety reasons but he refuses and wishes to d/c from hospital as soon as possible. Patient will continue to benefit from skilled physical therapy services to further improve independence with functional mobility.   Follow Up Recommendations  Home health PT;Supervision/Assistance - 24 hour     Equipment Recommendations  None recommended by PT    Recommendations for Other Services OT consult     Precautions / Restrictions Precautions Precautions: Knee Precaution Comments: Reviewed precautions Required Braces or Orthoses: Knee Immobilizer - Left Knee Immobilizer - Left: On when out of bed or walking Restrictions Weight Bearing Restrictions: Yes LLE Weight Bearing: Weight bearing as tolerated    Mobility  Bed Mobility               General bed mobility comments: Pt in chair at start of therapy  Transfers Overall transfer level: Needs assistance Equipment used: Rolling walker (2 wheeled) Transfers: Sit to/from Stand Sit to Stand: Min assist         General transfer comment: Pt required two attempts to stand from recliner initially with first attempt resulting in an uncontrolled sit from a near stand position, with min assist to safely guide back down due to loss of balance. Pt instructed in safe transfer techniques and to take his time versus rushing through the task. Had pt perform sit<>stand x4 at end of therapy session and was much better controlled at supervision  level.  Ambulation/Gait Ambulation/Gait assistance: Min guard Ambulation Distance (Feet): 60 Feet Assistive device: Rolling walker (2 wheeled) Gait Pattern/deviations: Step-to pattern;Decreased step length - right;Decreased stance time - left;Antalgic     General Gait Details: Pt with improved knee stability while wearing knee immobilizer this afternoon compared to AM therapy session. Cues for sequencing and to decrease speed, as knee becomes more unstable as she rushes. Much improved left knee stability with verbal cues to slow down and extend knee in left stance phase. Demonstrates good posture and control of RW.   Stairs Stairs:  (Pt refuses stair training)          Wheelchair Mobility    Modified Rankin (Stroke Patients Only)       Balance                                    Cognition Arousal/Alertness: Awake/alert Behavior During Therapy: WFL for tasks assessed/performed Overall Cognitive Status: Within Functional Limits for tasks assessed                      Exercises Total Joint Exercises Ankle Circles/Pumps: AROM;Both;10 reps;Seated Quad Sets: AROM;Left;10 reps;Supine    General Comments General comments (skin integrity, edema, etc.): reviewed precautions and safe mobility post TKA      Pertinent Vitals/Pain Pt denies any pain  Patient repositioned in chair for comfort.     Home Living  Prior Function            PT Goals (current goals can now be found in the care plan section) Acute Rehab PT Goals Patient Stated Goal: go home PT Goal Formulation: With patient Time For Goal Achievement: 10/07/13 Potential to Achieve Goals: Good Progress towards PT goals: Progressing toward goals    Frequency  7X/week    PT Plan Current plan remains appropriate    Co-evaluation             End of Session Equipment Utilized During Treatment: Gait belt Activity Tolerance: Patient tolerated treatment  well Patient left: in chair;with call bell/phone within reach;with family/visitor present     Time: 1275-1700 PT Time Calculation (min): 20 min  Charges:  $Gait Training: 8-22 mins                    G Codes:      [place], [place] 09/30/2013, 3:52 PM

## 2013-09-30 NOTE — Discharge Summary (Signed)
Patient ID: David Little MRN: [id number] DOB/AGE: 1936/04/10 78 y.o.  Admit date: 09/29/2013 Discharge date: 09/30/2013  Admission Diagnoses:  Principal Problem:   Osteoarthritis of left knee   Discharge Diagnoses:  Same  Past Medical History  Diagnosis Date  . Hyperlipidemia   . Hypercholesterolemia   . Coronary artery disease     s/p CABG in 2007 for severe left main disease  . Normal nuclear stress test March 2012    EF 55%. No ischemia. Mild inferior scar  . Myocardial infarction   . Lymphoma 2003    without recurrence  . Skin cancer of face     Surgeries: Procedure(s):left TOTAL KNEE ARTHROPLASTY on 09/29/2013   Discharged Condition: Improved  Hospital Course: SHAIN PAUWELS is an 78 y.o. male who was admitted 09/29/2013 for operative treatment ofOsteoarthritis of left knee. Patient has severe unremitting pain that affects sleep, daily activities, and work/hobbies. After pre-op clearance the patient was taken to the operating room on 09/29/2013 and underwent  Procedure(s):left TOTAL KNEE ARTHROPLASTY.    Patient was given perioperative antibiotics: Anti-infectives   Start     Dose/Rate Route Frequency Ordered Stop   09/29/13 1900  ceFAZolin (ANCEF) IVPB 2 g/50 mL premix     2 g 100 mL/hr over 30 Minutes Intravenous Every 6 hours 09/29/13 1852 09/30/13 0139   09/29/13 1245  vancomycin (VANCOCIN) IVPB 1000 mg/200 mL premix  Status:  Discontinued     1,000 mg 200 mL/hr over 60 Minutes Intravenous To Surgery 09/29/13 1231 09/29/13 1844   09/29/13 0600  ceFAZolin (ANCEF) IVPB 2 g/50 mL premix     2 g 100 mL/hr over 30 Minutes Intravenous On call to O.R. 09/28/13 1420 09/29/13 1222       Patient was given sequential compression devices, early ambulation, and chemoprophylaxis to prevent DVT.  Patient benefited maximally from hospital stay and there were no complications.    Recent vital signs: Patient Vitals for the past 24 hrs:  BP Temp Temp src Pulse Resp  SpO2 Height Weight  09/30/13 1055 112/59 mmHg - - 63 - - - -  09/30/13 0300 123/61 mmHg 98 F (36.7 C) Oral 67 18 100 % - -  09/30/13 0014 150/75 mmHg 97.8 F (36.6 C) Oral 76 18 100 % - -  09/29/13 2016 156/78 mmHg 98.1 F (36.7 C) Oral 68 18 99 % - -  09/29/13 1902 148/68 mmHg 97.6 F (36.4 C) Oral 66 18 100 % - -  09/29/13 1830 - - - 73 22 100 % - -  09/29/13 1815 - - - 71 17 100 % 5' 10.08" (1.78 m) 82.6 kg (182 lb 1.6 oz)  09/29/13 1809 141/72 mmHg 97.6 F (36.4 C) - 68 19 100 % - -  09/29/13 1800 - - - 63 18 100 % - -  09/29/13 1753 149/75 mmHg - - 58 14 100 % - -  09/29/13 1745 - - - 59 7 100 % - -  09/29/13 1730 - - - 64 18 100 % - -  09/29/13 1724 148/53 mmHg - - 76 20 100 % - -  09/29/13 1715 - - - 72 14 100 % - -  09/29/13 1709 146/77 mmHg - - 73 15 82 % - -  09/29/13 1700 - - - 61 12 100 % - -  09/29/13 1653 140/79 mmHg - - 64 15 100 % - -  09/29/13 1645 - - - 62 12 100 % - -  09/29/13  1638 142/69 mmHg - - 63 12 100 % - -  09/29/13 1630 - - - 67 18 100 % - -  09/29/13 1623 127/71 mmHg - - 65 16 100 % - -  09/29/13 1615 - - - 54 10 100 % - -  09/29/13 1608 127/72 mmHg - - 61 10 100 % - -  09/29/13 1600 - - - 61 17 100 % - -  09/29/13 1553 137/65 mmHg - - 61 10 100 % - -  09/29/13 1545 - - - 53 16 100 % - -  09/29/13 1538 137/67 mmHg - - 51 12 100 % - -  09/29/13 1530 - - - 50 10 100 % - -  09/29/13 1523 123/61 mmHg - - 57 9 100 % - -  09/29/13 1515 - - - 54 16 100 % - -  09/29/13 1508 110/56 mmHg - - 55 13 100 % - -  09/29/13 1500 - - - 51 10 100 % - -  09/29/13 1452 105/54 mmHg - - 48 12 100 % - -  09/29/13 1445 - - - 49 6 100 % - -  09/29/13 1430 108/59 mmHg 97.9 F (36.6 C) - 56 15 100 % - -     Recent laboratory studies:  Recent Labs  09/29/13 1022 09/30/13 0615  WBC  --  11.9*  HGB 14.3 13.2  HCT 42.0 39.1  PLT  --  182  NA 143 144  K 4.2 4.7  CL  --  106  CO2  --  21  BUN  --  14  CREATININE  --  0.96  GLUCOSE 97 159*  CALCIUM  --  8.9      Discharge Medications:     Medication List    STOP taking these medications       aspirin 325 MG tablet  Replaced by:  aspirin 325 MG EC tablet     ibuprofen 800 MG tablet  Commonly known as:  ADVIL,MOTRIN     naproxen sodium 220 MG tablet  Commonly known as:  ANAPROX      TAKE these medications       aspirin 325 MG EC tablet  Take 1 tablet (325 mg total) by mouth 2 (two) times daily after a meal.     metoprolol tartrate 25 MG tablet  Commonly known as:  LOPRESSOR  Take 1 tablet (25 mg total) by mouth 2 (two) times daily.     MULTIVITAMIN PO  Take by mouth daily.     nitroGLYCERIN 0.4 MG SL tablet  Commonly known as:  NITROSTAT  Place 0.4 mg under the tongue as needed.     oxyCODONE-acetaminophen 5-325 MG per tablet  Commonly known as:  PERCOCET/ROXICET  Take 1-2 tablets by mouth every 6 (six) hours as needed for severe pain.     simvastatin 40 MG tablet  Commonly known as:  ZOCOR  Take 1 tablet (40 mg total) by mouth daily.        Diagnostic Studies: Dg Chest 2 View  09/22/2013   CLINICAL DATA:  Coronary artery disease; preoperative evaluation  EXAM: CHEST  2 VIEW  COMPARISON:  April 05, 2006  FINDINGS: There is no edema or consolidation. The heart size and pulmonary vascularity are normal. No adenopathy. Patient is status post coronary artery bypass grafting.  IMPRESSION: No edema or consolidation.   Electronically Signed   By: Lowella Grip M.D.   On: 09/22/2013 11:32    Disposition:  Discharge Instructions   CPM    Complete by:  As directed   Continuous passive motion machine (CPM):      Use the CPM from 0 degrees to 70 degrees for 8 hours per day.      You may increase by 5 degrees per day.  You may break it up into 2 or 3 sessions per day.      Use CPM for 1-2 weeks or until you are told to stop.     Call MD / Call 911    Complete by:  As directed   If you experience chest pain or shortness of breath, CALL 911 and be transported to  the hospital emergency room.  If you develope a fever above 101 F, pus (white drainage) or increased drainage or redness at the wound, or calf pain, call your surgeon's office.     Constipation Prevention    Complete by:  As directed   Drink plenty of fluids.  Prune juice may be helpful.  You may use a stool softener, such as Colace (over the counter) 100 mg twice a day.  Use MiraLax (over the counter) for constipation as needed.     Diet general    Complete by:  As directed      Do not put a pillow under the knee. Place it under the heel.    Complete by:  As directed   Use your footsie roll at home     Increase activity slowly as tolerated    Complete by:  As directed      Weight bearing as tolerated    Complete by:  As directed            Follow-up Information   Follow up with GRAVES,JOHN L, MD. Schedule an appointment as soon as possible for a visit in 2 weeks.   Specialty:  Orthopedic Surgery   Contact information:   [place] [address] [phone]        Signed: Erlene Senters 09/30/2013, 2:09 PM

## 2013-10-05 ENCOUNTER — Telehealth: Payer: Self-pay | Admitting: Cardiology

## 2013-10-05 NOTE — Telephone Encounter (Signed)
Received request from Nurse fax box, documents faxed for surgical clearance. To: [facility] Fax number: [phone] Attention: [phone]/[phone]

## 2013-10-21 ENCOUNTER — Ambulatory Visit: Payer: Medicare Other | Attending: Orthopedic Surgery | Admitting: Physical Therapy

## 2013-10-21 DIAGNOSIS — Z96659 Presence of unspecified artificial knee joint: Secondary | ICD-10-CM | POA: Insufficient documentation

## 2013-10-21 DIAGNOSIS — R5381 Other malaise: Secondary | ICD-10-CM | POA: Insufficient documentation

## 2013-10-21 DIAGNOSIS — Z951 Presence of aortocoronary bypass graft: Secondary | ICD-10-CM | POA: Insufficient documentation

## 2013-10-21 DIAGNOSIS — IMO0001 Reserved for inherently not codable concepts without codable children: Secondary | ICD-10-CM | POA: Diagnosis not present

## 2013-10-21 DIAGNOSIS — M25669 Stiffness of unspecified knee, not elsewhere classified: Secondary | ICD-10-CM | POA: Diagnosis not present

## 2013-10-21 DIAGNOSIS — M25569 Pain in unspecified knee: Secondary | ICD-10-CM | POA: Insufficient documentation

## 2013-10-25 ENCOUNTER — Ambulatory Visit: Payer: Medicare Other | Admitting: Physical Therapy

## 2013-10-25 DIAGNOSIS — IMO0001 Reserved for inherently not codable concepts without codable children: Secondary | ICD-10-CM | POA: Diagnosis not present

## 2013-10-27 ENCOUNTER — Ambulatory Visit: Payer: Medicare Other | Admitting: *Deleted

## 2013-10-27 DIAGNOSIS — IMO0001 Reserved for inherently not codable concepts without codable children: Secondary | ICD-10-CM | POA: Diagnosis not present

## 2013-10-29 ENCOUNTER — Encounter: Payer: Medicare Other | Admitting: Physical Therapy

## 2013-11-01 ENCOUNTER — Ambulatory Visit: Payer: Medicare Other | Admitting: *Deleted

## 2013-11-01 DIAGNOSIS — IMO0001 Reserved for inherently not codable concepts without codable children: Secondary | ICD-10-CM | POA: Diagnosis not present

## 2013-11-03 ENCOUNTER — Ambulatory Visit: Payer: Medicare Other | Admitting: Physical Therapy

## 2013-11-03 DIAGNOSIS — IMO0001 Reserved for inherently not codable concepts without codable children: Secondary | ICD-10-CM | POA: Diagnosis not present

## 2013-11-16 ENCOUNTER — Ambulatory Visit: Payer: Medicare Other | Attending: Orthopedic Surgery | Admitting: Physical Therapy

## 2013-11-16 DIAGNOSIS — M25669 Stiffness of unspecified knee, not elsewhere classified: Secondary | ICD-10-CM | POA: Diagnosis not present

## 2013-11-16 DIAGNOSIS — R5381 Other malaise: Secondary | ICD-10-CM | POA: Diagnosis not present

## 2013-11-16 DIAGNOSIS — Z96659 Presence of unspecified artificial knee joint: Secondary | ICD-10-CM | POA: Diagnosis not present

## 2013-11-16 DIAGNOSIS — Z951 Presence of aortocoronary bypass graft: Secondary | ICD-10-CM | POA: Insufficient documentation

## 2013-11-16 DIAGNOSIS — IMO0001 Reserved for inherently not codable concepts without codable children: Secondary | ICD-10-CM | POA: Diagnosis not present

## 2013-11-16 DIAGNOSIS — M25569 Pain in unspecified knee: Secondary | ICD-10-CM | POA: Diagnosis not present

## 2013-11-17 ENCOUNTER — Ambulatory Visit: Payer: Medicare Other | Admitting: Physical Therapy

## 2013-11-17 DIAGNOSIS — IMO0001 Reserved for inherently not codable concepts without codable children: Secondary | ICD-10-CM | POA: Diagnosis not present

## 2013-11-23 ENCOUNTER — Ambulatory Visit: Payer: Medicare Other | Admitting: *Deleted

## 2013-11-23 DIAGNOSIS — IMO0001 Reserved for inherently not codable concepts without codable children: Secondary | ICD-10-CM | POA: Diagnosis not present

## 2013-11-30 ENCOUNTER — Ambulatory Visit: Payer: Medicare Other | Admitting: Physical Therapy

## 2013-11-30 DIAGNOSIS — IMO0001 Reserved for inherently not codable concepts without codable children: Secondary | ICD-10-CM | POA: Diagnosis not present

## 2013-12-03 ENCOUNTER — Ambulatory Visit: Payer: Medicare Other | Admitting: Physical Therapy

## 2013-12-03 DIAGNOSIS — IMO0001 Reserved for inherently not codable concepts without codable children: Secondary | ICD-10-CM | POA: Diagnosis not present

## 2013-12-08 ENCOUNTER — Ambulatory Visit: Payer: Medicare Other | Admitting: Physical Therapy

## 2013-12-08 DIAGNOSIS — IMO0001 Reserved for inherently not codable concepts without codable children: Secondary | ICD-10-CM | POA: Diagnosis not present

## 2013-12-10 ENCOUNTER — Ambulatory Visit: Payer: Medicare Other | Admitting: *Deleted

## 2013-12-10 DIAGNOSIS — IMO0001 Reserved for inherently not codable concepts without codable children: Secondary | ICD-10-CM | POA: Diagnosis not present

## 2013-12-14 ENCOUNTER — Ambulatory Visit: Payer: Medicare Other | Attending: Orthopedic Surgery | Admitting: Physical Therapy

## 2013-12-14 DIAGNOSIS — M25669 Stiffness of unspecified knee, not elsewhere classified: Secondary | ICD-10-CM | POA: Insufficient documentation

## 2013-12-14 DIAGNOSIS — M25569 Pain in unspecified knee: Secondary | ICD-10-CM | POA: Diagnosis not present

## 2013-12-14 DIAGNOSIS — IMO0001 Reserved for inherently not codable concepts without codable children: Secondary | ICD-10-CM | POA: Insufficient documentation

## 2013-12-14 DIAGNOSIS — Z951 Presence of aortocoronary bypass graft: Secondary | ICD-10-CM | POA: Insufficient documentation

## 2013-12-14 DIAGNOSIS — R5381 Other malaise: Secondary | ICD-10-CM | POA: Diagnosis not present

## 2013-12-14 DIAGNOSIS — Z96659 Presence of unspecified artificial knee joint: Secondary | ICD-10-CM | POA: Diagnosis not present

## 2013-12-16 ENCOUNTER — Ambulatory Visit: Payer: Medicare Other | Admitting: Physical Therapy

## 2013-12-16 DIAGNOSIS — IMO0001 Reserved for inherently not codable concepts without codable children: Secondary | ICD-10-CM | POA: Diagnosis not present

## 2013-12-21 ENCOUNTER — Ambulatory Visit: Payer: Medicare Other | Admitting: Physical Therapy

## 2013-12-21 DIAGNOSIS — IMO0001 Reserved for inherently not codable concepts without codable children: Secondary | ICD-10-CM | POA: Diagnosis not present

## 2013-12-23 ENCOUNTER — Ambulatory Visit: Payer: Medicare Other | Admitting: Physical Therapy

## 2013-12-23 DIAGNOSIS — IMO0001 Reserved for inherently not codable concepts without codable children: Secondary | ICD-10-CM | POA: Diagnosis not present

## 2013-12-28 ENCOUNTER — Ambulatory Visit: Payer: Medicare Other | Admitting: *Deleted

## 2013-12-28 DIAGNOSIS — IMO0001 Reserved for inherently not codable concepts without codable children: Secondary | ICD-10-CM | POA: Diagnosis not present

## 2014-01-04 ENCOUNTER — Ambulatory Visit: Payer: Medicare Other | Admitting: Physical Therapy

## 2014-01-04 DIAGNOSIS — IMO0001 Reserved for inherently not codable concepts without codable children: Secondary | ICD-10-CM | POA: Diagnosis not present

## 2014-01-06 ENCOUNTER — Ambulatory Visit: Payer: Medicare Other | Admitting: Physical Therapy

## 2014-01-06 DIAGNOSIS — IMO0001 Reserved for inherently not codable concepts without codable children: Secondary | ICD-10-CM | POA: Diagnosis not present

## 2014-01-11 ENCOUNTER — Ambulatory Visit: Payer: Medicare Other | Attending: Orthopedic Surgery | Admitting: Physical Therapy

## 2014-01-11 DIAGNOSIS — Z951 Presence of aortocoronary bypass graft: Secondary | ICD-10-CM | POA: Insufficient documentation

## 2014-01-11 DIAGNOSIS — R5381 Other malaise: Secondary | ICD-10-CM | POA: Diagnosis not present

## 2014-01-11 DIAGNOSIS — M25569 Pain in unspecified knee: Secondary | ICD-10-CM | POA: Insufficient documentation

## 2014-01-11 DIAGNOSIS — Z96659 Presence of unspecified artificial knee joint: Secondary | ICD-10-CM | POA: Diagnosis not present

## 2014-01-11 DIAGNOSIS — M25669 Stiffness of unspecified knee, not elsewhere classified: Secondary | ICD-10-CM | POA: Insufficient documentation

## 2014-01-11 DIAGNOSIS — IMO0001 Reserved for inherently not codable concepts without codable children: Secondary | ICD-10-CM | POA: Diagnosis present

## 2014-01-14 ENCOUNTER — Encounter: Payer: Medicare Other | Admitting: Physical Therapy

## 2014-01-18 ENCOUNTER — Ambulatory Visit: Payer: Medicare Other | Admitting: Physical Therapy

## 2014-01-18 DIAGNOSIS — IMO0001 Reserved for inherently not codable concepts without codable children: Secondary | ICD-10-CM | POA: Diagnosis not present

## 2014-01-20 ENCOUNTER — Ambulatory Visit: Payer: Medicare Other | Admitting: Physical Therapy

## 2014-01-20 DIAGNOSIS — IMO0001 Reserved for inherently not codable concepts without codable children: Secondary | ICD-10-CM | POA: Diagnosis not present

## 2014-01-25 ENCOUNTER — Ambulatory Visit: Payer: Medicare Other | Admitting: Physical Therapy

## 2014-01-25 DIAGNOSIS — IMO0001 Reserved for inherently not codable concepts without codable children: Secondary | ICD-10-CM | POA: Diagnosis not present

## 2014-02-21 ENCOUNTER — Encounter: Payer: Self-pay | Admitting: Internal Medicine

## 2014-06-08 ENCOUNTER — Other Ambulatory Visit: Payer: Self-pay | Admitting: *Deleted

## 2014-06-08 DIAGNOSIS — I10 Essential (primary) hypertension: Secondary | ICD-10-CM

## 2014-06-08 DIAGNOSIS — E785 Hyperlipidemia, unspecified: Secondary | ICD-10-CM

## 2014-06-08 DIAGNOSIS — I2510 Atherosclerotic heart disease of native coronary artery without angina pectoris: Secondary | ICD-10-CM

## 2014-06-08 MED ORDER — SIMVASTATIN 40 MG PO TABS: 40.0000 mg | ORAL_TABLET | Freq: Every day | ORAL | Status: DC

## 2014-06-08 MED ORDER — METOPROLOL TARTRATE 25 MG PO TABS: 25.0000 mg | ORAL_TABLET | Freq: Two times a day (BID) | ORAL | Status: DC

## 2014-06-20 ENCOUNTER — Telehealth: Payer: Self-pay | Admitting: Cardiology

## 2014-06-20 NOTE — Telephone Encounter (Signed)
Pt need he to have 4 teeth extracted. He needs a written statement from Dr Jobe Igo saying this is all right.Please fax this asap to [phone].

## 2014-06-21 ENCOUNTER — Encounter: Payer: Self-pay | Admitting: *Deleted

## 2014-06-21 NOTE — Telephone Encounter (Signed)
Form sent to dentist office

## 2014-06-21 NOTE — Telephone Encounter (Signed)
Pt called in stating that he needs to have 4 teeth pulled and he is currently on a blood thinner and he would like to know if Dr. Percival Spanish approves of this procedure. Please call back  Thanks

## 2014-06-22 ENCOUNTER — Telehealth: Payer: Self-pay | Admitting: Cardiology

## 2014-06-22 NOTE — Telephone Encounter (Signed)
David Little called stating that the pt will be having 8 teeth extracted and would like to know if the pt could switch to 81mg  aspirin. Please call  Thanks

## 2014-06-22 NOTE — Telephone Encounter (Signed)
OK to switch to lower dose ASA.

## 2014-06-22 NOTE — Telephone Encounter (Signed)
Pt. Having 8 teeth pulled and wanted to know if he could switch to ASA 81mg  from ASA 325mg 

## 2014-06-22 NOTE — Telephone Encounter (Signed)
Sandy called at dentist and message left

## 2014-07-27 ENCOUNTER — Encounter: Payer: Self-pay | Admitting: Cardiology

## 2014-07-27 ENCOUNTER — Ambulatory Visit (INDEPENDENT_AMBULATORY_CARE_PROVIDER_SITE_OTHER): Payer: Medicare Other | Admitting: Cardiology

## 2014-07-27 VITALS — BP 102/76 | HR 62 | Ht 70.0 in | Wt 174.0 lb

## 2014-07-27 DIAGNOSIS — I2510 Atherosclerotic heart disease of native coronary artery without angina pectoris: Secondary | ICD-10-CM

## 2014-07-27 DIAGNOSIS — E785 Hyperlipidemia, unspecified: Secondary | ICD-10-CM

## 2014-07-27 MED ORDER — ASPIRIN EC 81 MG PO TBEC: 81.0000 mg | DELAYED_RELEASE_TABLET | Freq: Every day | ORAL | Status: DC

## 2014-07-27 MED ORDER — NITROGLYCERIN 0.4 MG SL SUBL: 0.4000 mg | SUBLINGUAL_TABLET | SUBLINGUAL | Status: DC | PRN

## 2014-07-27 NOTE — Patient Instructions (Signed)
Please decrease your ASA to 81 mg a day. Continue all other medications as listed.  Please have a fasting lipid panel (blood work) at your primary care physician's office.  Follow up in 1 year with Dr. Percival Spanish for a treadmill test.  You will receive a letter in the mail 2 months before you are due.  Please call us when you receive this letter to schedule your follow up appointment.  Thank you for choosing [facility]!!

## 2014-07-27 NOTE — Progress Notes (Signed)
HPI The patient presents for follow up of CAD.  The patient denies any new symptoms such as chest discomfort, neck or arm discomfort. There has been no new shortness of breath, PND or orthopnea. There have been no reported palpitations, presyncope or syncope.  He had TKR since I last saw him.  He did well with this.  He is doing some walking on a treadmill.    No Known Allergies  Current Outpatient Prescriptions  Medication Sig Dispense Refill  . aspirin 325 MG EC tablet Take 325 mg by mouth daily.    . metoprolol tartrate (LOPRESSOR) 25 MG tablet Take 1 tablet (25 mg total) by mouth 2 (two) times daily. 180 tablet 1  . Multiple Vitamin (MULTIVITAMIN PO) Take by mouth daily.      . nitroGLYCERIN (NITROSTAT) 0.4 MG SL tablet Place 0.4 mg under the tongue as needed.      Marland Kitchen oxyCODONE-acetaminophen (PERCOCET/ROXICET) 5-325 MG per tablet Take 1-2 tablets by mouth every 6 (six) hours as needed for severe pain. 50 tablet 0  . simvastatin (ZOCOR) 40 MG tablet Take 1 tablet (40 mg total) by mouth daily. 90 tablet 1   No current facility-administered medications for this visit.    Past Medical History  Diagnosis Date  . Hyperlipidemia   . Hypercholesterolemia   . Coronary artery disease     s/p CABG in 2007 for severe left main disease  . Normal nuclear stress test March 2012    EF 55%. No ischemia. Mild inferior scar  . Myocardial infarction   . Lymphoma 2003    without recurrence  . Skin cancer of face     Past Surgical History  Procedure Laterality Date  . Coronary artery bypass graft  2007    Had coronary artery bypass grafting for severe left main coronary artery stenosis--Had follow up stress cardiolite study in February 2010 which showed EF of 55% with mild inferior hypokinesia--There was no ischemia  . Total knee arthroplasty Left 09/29/2013    Procedure: TOTAL KNEE ARTHROPLASTY;  Surgeon: Alta Corning, MD;  Location: [place];  Service: Orthopedics;  Laterality: Left;     ROS:  As stated in the HPI and negative for all other systems.  PHYSICAL EXAM BP 102/76 mmHg  Pulse 62  Ht 5\' 10"  (1.778 m)  Wt 174 lb (78.926 kg)  BMI 24.97 kg/m2 GENERAL:  Well appearing NECK:  No jugular venous distention, waveform within normal limits, carotid upstroke brisk and symmetric, no bruits, no thyromegaly LUNGS:  Clear to auscultation bilaterally BACK:  No CVA tenderness CHEST:  Well healed sternotomy scar. HEART:  PMI not displaced or sustained,S1 and S2 within normal limits, no S3, no S4, no clicks, no rubs, no murmurs ABD:  Flat, positive bowel sounds normal in frequency in pitch, no bruits, no rebound, no guarding, no midline pulsatile mass, no hepatomegaly, no splenomegaly EXT:  2 plus pulses throughout, no edema, no cyanosis no clubbing   EKG:  Sinus rhythm, rate 62, axis within normal limits, intervals within normal limits, no acute ST-T wave changes. 07/27/2014   ASSESSMENT AND PLAN  Coronary artery disease) -  The patient has no new sypmtoms.  No further cardiovascular testing is indicated.  We will continue with aggressive risk reduction and meds as listed other than going to an 81 mg ASA.  I will bring him back for a POET (Plain Old Exercise Treadmill) next year as it will have been five years since the last stress  test.   Hyperlipidemia -  He will go to Dr. Tawanna Sat office for a fasting lipid with a goal LDL less than 100 and HDL greater than 40.  His LDLl last year was 1 with an HDL of 58.

## 2014-08-08 ENCOUNTER — Telehealth: Payer: Self-pay | Admitting: Oncology

## 2014-08-08 NOTE — Telephone Encounter (Signed)
Lft msg for pt confirming MD visit

## 2014-08-27 ENCOUNTER — Other Ambulatory Visit: Payer: Self-pay | Admitting: Cardiology

## 2014-08-29 ENCOUNTER — Telehealth: Payer: Self-pay | Admitting: Oncology

## 2014-08-29 NOTE — Telephone Encounter (Signed)
Returned patients voicemail to move appointment to a earlier time. Patient confirmed 04/28. Mailed calendar

## 2014-09-01 ENCOUNTER — Ambulatory Visit: Payer: Medicare Other | Admitting: Oncology

## 2014-09-08 ENCOUNTER — Telehealth: Payer: Self-pay | Admitting: *Deleted

## 2014-09-08 ENCOUNTER — Telehealth: Payer: Self-pay | Admitting: Oncology

## 2014-09-08 ENCOUNTER — Ambulatory Visit (HOSPITAL_BASED_OUTPATIENT_CLINIC_OR_DEPARTMENT_OTHER): Payer: Medicare Other | Admitting: Oncology

## 2014-09-08 ENCOUNTER — Other Ambulatory Visit (HOSPITAL_BASED_OUTPATIENT_CLINIC_OR_DEPARTMENT_OTHER): Payer: Medicare Other

## 2014-09-08 VITALS — BP 113/61 | HR 69 | Temp 97.6°F | Resp 18 | Ht 70.0 in | Wt 172.1 lb

## 2014-09-08 DIAGNOSIS — C8590 Non-Hodgkin lymphoma, unspecified, unspecified site: Secondary | ICD-10-CM

## 2014-09-08 DIAGNOSIS — M25562 Pain in left knee: Secondary | ICD-10-CM

## 2014-09-08 DIAGNOSIS — L989 Disorder of the skin and subcutaneous tissue, unspecified: Secondary | ICD-10-CM

## 2014-09-08 DIAGNOSIS — Z8572 Personal history of non-Hodgkin lymphomas: Secondary | ICD-10-CM

## 2014-09-08 LAB — CBC WITH DIFFERENTIAL/PLATELET
BASO%: 0.6 % (ref 0.0–2.0)
Basophils Absolute: 0.1 10*3/uL (ref 0.0–0.1)
EOS ABS: 0.3 10*3/uL (ref 0.0–0.5)
EOS%: 3.5 % (ref 0.0–7.0)
HCT: 44 % (ref 38.4–49.9)
HGB: 14.3 g/dL (ref 13.0–17.1)
LYMPH%: 43.8 % (ref 14.0–49.0)
MCH: 28 pg (ref 27.2–33.4)
MCHC: 32.6 g/dL (ref 32.0–36.0)
MCV: 86 fL (ref 79.3–98.0)
MONO#: 0.9 10*3/uL (ref 0.1–0.9)
MONO%: 9.3 % (ref 0.0–14.0)
NEUT%: 42.8 % (ref 39.0–75.0)
NEUTROS ABS: 4 10*3/uL (ref 1.5–6.5)
PLATELETS: 192 10*3/uL (ref 140–400)
RBC: 5.11 10*6/uL (ref 4.20–5.82)
RDW: 13.8 % (ref 11.0–14.6)
WBC: 9.3 10*3/uL (ref 4.0–10.3)
lymph#: 4.1 10*3/uL — ABNORMAL HIGH (ref 0.9–3.3)

## 2014-09-08 NOTE — Telephone Encounter (Signed)
Per Dr. Benay Spice; notified pt that hb is good @ 14.3; instructions given to pt to get his Prevnar vaccine at his PCP.  Pt verbalized understanding of all information.

## 2014-09-08 NOTE — Telephone Encounter (Signed)
Pt confirmed labs/ov per 04/27 POF, gave AVS and Calendar..... KJ  °

## 2014-09-08 NOTE — Progress Notes (Signed)
  [place] OFFICE PROGRESS NOTE   Diagnosis: Non-Hodgkin's lymphoma  INTERVAL HISTORY:   Mr. Brideau returns as scheduled. He feels well. No fever, night sweats, or palpable lymph nodes. He underwent left knee replacement surgery in May 2015. He reports feeling tired around noon each day.  Objective:  Vital signs in last 24 hours:  Blood pressure 113/61, pulse 69, temperature 97.6 F (36.4 C), temperature source Oral, resp. rate 18, height 5\' 10"  (1.778 m), weight 172 lb 1.6 oz (78.064 kg), SpO2 99 %.    HEENT: Neck without mass Lymphatics: No cervical, supraclavicular, axillary, or inguinal nodes. Resp: Lungs clear bilaterally Cardio: Regular rate and rhythm GI: No hepatosplenomegaly, nontender, no mass, reducible left inguinal hernia Vascular: Trace edema at the left lower leg below the knee  Skin: Crusted slightly raised less than 1 cm lesion at the left preauricular region    Medications: I have reviewed the patient's current medications.  Assessment/Plan: 1. Non-Hodgkin lymphoma - he completed fludarabine chemotherapy in February 2003. He remains in clinical remission. 2. History of coronary artery disease - status post coronary artery bypass surgery. 3. Pneumococcal vaccine in April 2014 4. Left knee pain and swelling-? Left knee arthritis  Disposition:  Mr. Minette Brine remains in clinical remission from non-Hodgkin's follow-up. He would like to continue follow-up at the [facility]. He will return for an office visit in one year. I recommended he follow-up with his dermatologist to evaluate the left face skin lesion. We will check his hemoglobin today. We will recommend he get the Prevnar vaccine with his primary physician.  Betsy Coder, MD  09/08/2014  12:50 PM

## 2014-09-09 ENCOUNTER — Telehealth: Payer: Self-pay | Admitting: *Deleted

## 2014-09-09 DIAGNOSIS — C8590 Non-Hodgkin lymphoma, unspecified, unspecified site: Secondary | ICD-10-CM | POA: Insufficient documentation

## 2014-09-09 NOTE — Telephone Encounter (Signed)
Per Dr. Benay Spice; left voice message for pt to call re: lab results.  Scheduling notified re: lab only appt in 6 months.

## 2014-09-09 NOTE — Telephone Encounter (Signed)
-----   Message from Ladell Pier, MD sent at 09/08/2014  5:57 PM EDT ----- Please call patient, hb is normal, lymphocytes slightly high, likely a benign nonspecific finding, check cbc/diff 6 months

## 2014-09-12 NOTE — Telephone Encounter (Signed)
Lft msg for pt confirming labs added, mailed schedule to pt.... David Little

## 2014-09-14 ENCOUNTER — Telehealth: Payer: Self-pay | Admitting: *Deleted

## 2014-09-14 NOTE — Telephone Encounter (Signed)
Results fax'd to Dr. Rosezella [place] office and available on [facility]

## 2014-09-14 NOTE — Telephone Encounter (Signed)
TC from patient inquiring about recent lab work. Review CBC with patient from 09/08/14. Pt requests copy sent to Dr. Percival Spanish

## 2014-09-22 ENCOUNTER — Telehealth: Payer: Self-pay | Admitting: *Deleted

## 2014-09-22 NOTE — Telephone Encounter (Signed)
PER 09/09/14 NOTE FROM DR.SHERRILL- PT.'S HGB WAS NORMAL. LYMPH #4.1 WHICH IS SLIGHTLY HIGH BUT LIKELY A BENIGN NONSPECIFIC FINDING. WILL CHECK CBC IN SIX MONTHS WHICH IS 03/10/15. PT. ASKING DR.SHERRILL IF CHECKING A CBC IN SIX MONTHS IS SOON ENOUGH OR SHOULD IT BE CHECKED EARLIER THAN SIX MONTHS.

## 2014-09-23 NOTE — Telephone Encounter (Signed)
6 months is ok

## 2014-09-23 NOTE — Telephone Encounter (Signed)
NOTIFIED PT. PER DR.SHERRILL LAB CHECK IN SIX MONTHS WILL BE OK. HE VOICES UNDERSTANDING.

## 2014-11-12 ENCOUNTER — Other Ambulatory Visit: Payer: Self-pay | Admitting: Cardiology

## 2015-03-10 ENCOUNTER — Other Ambulatory Visit (HOSPITAL_BASED_OUTPATIENT_CLINIC_OR_DEPARTMENT_OTHER): Payer: Medicare Other

## 2015-03-10 DIAGNOSIS — C8590 Non-Hodgkin lymphoma, unspecified, unspecified site: Secondary | ICD-10-CM

## 2015-03-10 LAB — CBC WITH DIFFERENTIAL/PLATELET
BASO%: 0.4 % (ref 0.0–2.0)
BASOS ABS: 0 10*3/uL (ref 0.0–0.1)
EOS ABS: 0.2 10*3/uL (ref 0.0–0.5)
EOS%: 3.3 % (ref 0.0–7.0)
HCT: 45.7 % (ref 38.4–49.9)
HGB: 15.2 g/dL (ref 13.0–17.1)
LYMPH%: 48.6 % (ref 14.0–49.0)
MCH: 29.1 pg (ref 27.2–33.4)
MCHC: 33.2 g/dL (ref 32.0–36.0)
MCV: 87.7 fL (ref 79.3–98.0)
MONO#: 0.5 10*3/uL (ref 0.1–0.9)
MONO%: 7.1 % (ref 0.0–14.0)
NEUT#: 3.1 10*3/uL (ref 1.5–6.5)
NEUT%: 40.6 % (ref 39.0–75.0)
Platelets: 189 10*3/uL (ref 140–400)
RBC: 5.2 10*6/uL (ref 4.20–5.82)
RDW: 13.3 % (ref 11.0–14.6)
WBC: 7.6 10*3/uL (ref 4.0–10.3)
lymph#: 3.7 10*3/uL — ABNORMAL HIGH (ref 0.9–3.3)

## 2015-03-14 ENCOUNTER — Telehealth: Payer: Self-pay | Admitting: *Deleted

## 2015-03-14 NOTE — Telephone Encounter (Signed)
Called pt, informed him of normal HGB. Follow up as scheduled, per Dr. Benay Spice. Pt voiced understanding.

## 2015-03-14 NOTE — Telephone Encounter (Signed)
-----   Message from Ladell Pier, MD sent at 03/13/2015  8:14 PM EDT ----- Please call patient, hb is normal, f/u as scheduled

## 2015-06-19 ENCOUNTER — Other Ambulatory Visit: Payer: Self-pay | Admitting: Cardiology

## 2015-06-19 NOTE — Telephone Encounter (Signed)
Rx request sent to pharmacy.  

## 2015-09-11 ENCOUNTER — Telehealth: Payer: Self-pay | Admitting: Oncology

## 2015-09-11 ENCOUNTER — Ambulatory Visit (HOSPITAL_BASED_OUTPATIENT_CLINIC_OR_DEPARTMENT_OTHER): Payer: Medicare Other | Admitting: Oncology

## 2015-09-11 VITALS — BP 115/63 | HR 67 | Temp 97.8°F | Resp 18 | Ht 70.0 in | Wt 177.2 lb

## 2015-09-11 DIAGNOSIS — C8590 Non-Hodgkin lymphoma, unspecified, unspecified site: Secondary | ICD-10-CM

## 2015-09-11 DIAGNOSIS — Z8572 Personal history of non-Hodgkin lymphomas: Secondary | ICD-10-CM | POA: Diagnosis not present

## 2015-09-11 DIAGNOSIS — K4090 Unilateral inguinal hernia, without obstruction or gangrene, not specified as recurrent: Secondary | ICD-10-CM | POA: Diagnosis not present

## 2015-09-11 NOTE — Telephone Encounter (Signed)
spoke w/ pt confirmed appt w/ [facility]

## 2015-09-11 NOTE — Progress Notes (Addendum)
  [place] OFFICE PROGRESS NOTE   Diagnosis:  Non-Hodgkin's lymphoma  INTERVAL HISTORY:    [name] returns as scheduled. He feels well. Good appetite. No palpable lymph nodes. He has developed a left inguinal hernia. This is not painful.  Objective:  Vital signs in last 24 hours:  Blood pressure 115/63, pulse 67, temperature 97.8 F (36.6 C), temperature source Oral, resp. rate 18, height 5\' 10"  (1.778 m), weight 177 lb 3.2 oz (80.377 kg), SpO2 99 %.    HEENT:  Neck without mass Lymphatics:  No cervical, supraclavicular, axillary, or inguinal nodes Resp:  Lungs clear bilaterally Cardio:  Regular rate and rhythm GI:  No hepatomegaly, no mass, nontender , soft fullness in the left inguinal region , no scrotal edema or testicular mass Vascular:  No leg edema Skin: slightly raised clear lesion at the left cheek    Lab Results:  Lab Results  Component Value Date   WBC 7.6 03/10/2015   HGB 15.2 03/10/2015   HCT 45.7 03/10/2015   MCV 87.7 03/10/2015   PLT 189 03/10/2015   NEUTROABS 3.1 03/10/2015     Medications: I have reviewed the patient's current medications.  Assessment/Plan: 1. Non-Hodgkin lymphoma - he completed fludarabine chemotherapy in February 2003. He remains in clinical remission.  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 History of coronary artery disease - status post coronary artery bypass surgery. 3. Pneumococcal vaccine in April 2014 4. Left knee pain and swelling-? Left knee arthritis   Disposition:   Mr. Lawrimore remains in remission from non-Hodgkin's lymphoma. He would like to continue follow-up at the [facility]. He will return for an office visit in one year. We will be sure he has received the 13 valent pneumococcal vaccine. I will refer him to Dr. Zella Richer to evaluate  the left inguinal hernia.  I recommended he see his dermatologist to evaluate the left face skin lesion.  Betsy Coder, MD  09/11/2015  11:48 AM

## 2015-09-11 NOTE — Telephone Encounter (Signed)
Gave pt appt & avs °

## 2015-09-18 ENCOUNTER — Other Ambulatory Visit: Payer: Self-pay | Admitting: Cardiology

## 2015-09-18 NOTE — Telephone Encounter (Signed)
Rx request sent to pharmacy.  

## 2015-09-25 ENCOUNTER — Ambulatory Visit: Payer: Self-pay | Admitting: General Surgery

## 2015-09-25 DIAGNOSIS — K4090 Unilateral inguinal hernia, without obstruction or gangrene, not specified as recurrent: Secondary | ICD-10-CM | POA: Diagnosis not present

## 2015-09-25 NOTE — H&P (Signed)
David Little 09/25/2015 9:35 AM Location: [facility] Patient #: [id number] DOB: [birth year] Married / Language: [name] / Race: White Male  History of Present Illness Odis Hollingshead MD; 09/25/2015 10:09 AM) Patient words: Inguinal hernia.  The patient is a 80 year old male.   Note:He is referred by Dr. Benay Spice for consultation regarding a left inguinal hernia. He noticed a bulge in the left groin about a year ago that has progressively been getting larger. When he is walking, sometimes it causes some discomfort. He does have to push it back in at times. It does not interfere with his bowel or his bladder habits. He does not have any difficulty starting his urinary stream. He occasionally has constipation. His past medical history is notable for coronary artery disease status post coronary artery bypass grafting. He last saw his cardiologist, Dr. Percival Spanish, last March and has not yet seen him this year. He states that 3 months ago he had some mild chest discomfort while working outside and took a nitroglycerin which resolved this. He did not tell anybody about it. He has not had any problems since.  Other Problems Patsey Berthold, CMA; 09/25/2015 9:35 AM) Cancer Congestive Heart Failure Melanoma  Past Surgical History Patsey Berthold, CMA; 09/25/2015 9:35 AM) Coronary Artery Bypass Graft Knee Surgery Left. Sentinel Lymph Node Biopsy  Diagnostic Studies History Patsey Berthold, [place]; 09/25/2015 9:35 AM) Colonoscopy never  Allergies Patsey Berthold, CMA; 09/25/2015 9:36 AM) No Known Drug Allergies 09/25/2015  Medication History Patsey Berthold, CMA; 09/25/2015 9:37 AM) Metoprolol Tartrate (25MG  Tablet, Oral daily) Active. Simvastatin (40MG  Tablet, Oral daily) Active. Nitroglycerin (0.4MG  Tab Sublingual, Sublingual as needed) Active. Aspirin (325MG  Tablet, Oral daily) Active. Multivitamin Adult (Oral daily) Active. Medications Reconciled  Social  History Patsey Berthold, CMA; 09/25/2015 9:35 AM) Alcohol use Occasional alcohol use. Caffeine use Carbonated beverages, Coffee. No drug use Tobacco use Former smoker.  Family History Patsey Berthold, [place]; 09/25/2015 9:35 AM) Cancer Father.     Review of Systems Patsey Berthold CMA; 09/25/2015 9:35 AM) General Not Present- Appetite Loss, Chills, Fatigue, Fever, Night Sweats, Weight Gain and Weight Loss. Skin Not Present- Change in Wart/Mole, Dryness, Hives, Jaundice, New Lesions, Non-Healing Wounds, Rash and Ulcer. HEENT Present- Wears glasses/contact lenses. Not Present- Earache, Hearing Loss, Hoarseness, Nose Bleed, Oral Ulcers, Ringing in the Ears, Seasonal Allergies, Sinus Pain, Sore Throat, Visual Disturbances and Yellow Eyes. Respiratory Not Present- Bloody sputum, Chronic Cough, Difficulty Breathing, Snoring and Wheezing. Breast Not Present- Breast Mass, Breast Pain, Nipple Discharge and Skin Changes. Cardiovascular Not Present- Chest Pain, Difficulty Breathing Lying Down, Leg Cramps, Palpitations, Rapid Heart Rate, Shortness of Breath and Swelling of Extremities. Gastrointestinal Not Present- Abdominal Pain, Bloating, Bloody Stool, Change in Bowel Habits, Chronic diarrhea, Constipation, Difficulty Swallowing, Excessive gas, Gets full quickly at meals, Hemorrhoids, Indigestion, Nausea, Rectal Pain and Vomiting. Male Genitourinary Not Present- Blood in Urine, Change in Urinary Stream, Frequency, Impotence, Nocturia, Painful Urination, Urgency and Urine Leakage. Musculoskeletal Not Present- Back Pain, Joint Pain, Joint Stiffness, Muscle Pain, Muscle Weakness and Swelling of Extremities. Neurological Not Present- Decreased Memory, Fainting, Headaches, Numbness, Seizures, Tingling, Tremor, Trouble walking and Weakness. Psychiatric Not Present- Anxiety, Bipolar, Change in Sleep Pattern, Depression, Fearful and Frequent crying. Endocrine Not Present- Cold Intolerance, Excessive Hunger, Hair  Changes, Heat Intolerance, Hot flashes and New Diabetes. Hematology Present- Easy Bruising. Not Present- Excessive bleeding, Gland problems, HIV and Persistent Infections.  Vitals Patsey Berthold CMA; 09/25/2015 9:38 AM) 09/25/2015 9:37 AM Weight: 176.2 lb Height: 71in Body  Surface Area: 2 m Body Mass Index: 24.57 kg/m  Temp.: 97.102F  Pulse: 70 (Regular)  BP: 118/60 (Sitting, Left Arm, Standard)      Physical Exam Odis Hollingshead MD; 09/25/2015 10:11 AM)  The physical exam findings are as follows: Note:General: WDWN elderly male in NAD. Pleasant and cooperative.  HEENT: [place]/AT, no facial masses  CV: RRR.  CHEST: Breath sounds equal and clear. Respirations nonlabored. Midsternal scar  ABDOMEN: Soft, nontender, no umbilical hernia.  GU: Moderate sized, reducible left inguinal bulge. No right inguinal bulge. No testicular masses.  SKIN: No jaundice.  NEUROLOGIC: Alert and oriented, answers questions appropriately.  PSYCHIATRIC: Normal mood, affect , and behavior.    Assessment & Plan Odis Hollingshead MD; 09/25/2015 10:13 AM)  LEFT INGUINAL HERNIA (K40.90) Impression: This is getting larger and is symptomatic. We discussed open repair with mesh and he is interested in proceeding with this. However, he is overdue for his cardiology follow-up appointment.  Plan: I told him after his cardiology follow-up appointment we could schedule his surgery. This would be open left inguinal hernia repair with mesh. I have explained the procedure, risks, and aftercare of inguinal hernia repair. Risks include but are not limited to bleeding, infection, wound problems, anesthesia, recurrence, bladder or intestine injury, urinary retention, testicular dysfunction, chronic pain, mesh problems. He seems to understand and agrees with the plan. I suggested he call Dr. Rosezella [place] office and arrange for a follow-up appointment.  Jackolyn Confer, MD

## 2015-10-01 NOTE — Progress Notes (Signed)
HPI The patient presents for follow up of CAD.  The patient denies any new symptoms such as chest discomfort, neck or arm discomfort. There has been no new shortness of breath, PND or orthopnea. There have been no reported palpitations, presyncope or syncope.   He is going to have inguinal hernia repair.   He is not walking on the treadmill like he was but he does still do some yardwork. With this he denies any cardiovascular symptoms.    No Known Allergies  Current Outpatient Prescriptions  Medication Sig Dispense Refill  . aspirin EC 81 MG tablet Take 1 tablet (81 mg total) by mouth daily.    . metoprolol tartrate (LOPRESSOR) 25 MG tablet Take 1 tablet (25 mg total) by mouth 2 (two) times daily. Please schedule appointment for refills. 180 tablet 0  . Multiple Vitamin (MULTIVITAMIN PO) Take by mouth daily.      . nitroGLYCERIN (NITROSTAT) 0.4 MG SL tablet Place 1 tablet (0.4 mg total) under the tongue as needed. 25 tablet prn  . simvastatin (ZOCOR) 40 MG tablet Take 1 tablet (40 mg total) by mouth daily at 6 PM. Please schedule appointment for refills. 90 tablet 0   No current facility-administered medications for this visit.    Past Medical History  Diagnosis Date  . Hyperlipidemia   . Hypercholesterolemia   . Coronary artery disease     s/p CABG in 2007 for severe left main disease  . Normal nuclear stress test March 2012    EF 55%. No ischemia. Mild inferior scar  . Myocardial infarction ([place])   . Lymphoma ([place]) 2003    without recurrence  . Skin cancer of face     Past Surgical History  Procedure Laterality Date  . Coronary artery bypass graft  2007    Had coronary artery bypass grafting for severe left main coronary artery stenosis--Had follow up stress cardiolite study in February 2010 which showed EF of 55% with mild inferior hypokinesia--There was no ischemia  . Total knee arthroplasty Left 09/29/2013    Procedure: TOTAL KNEE ARTHROPLASTY;  Surgeon: Alta Corning,  MD;  Location: [place];  Service: Orthopedics;  Laterality: Left;    ROS:  As stated in the HPI and negative for all other systems.  PHYSICAL EXAM BP 111/64 mmHg  Pulse 69  Ht 5\' 10"  (1.778 m)  Wt 175 lb 6.4 oz (79.561 kg)  BMI 25.17 kg/m2 GENERAL:  Well appearing NECK:  No jugular venous distention, waveform within normal limits, carotid upstroke brisk and symmetric, no bruits, no thyromegaly LUNGS:  Clear to auscultation bilaterally BACK:  No CVA tenderness CHEST:  Well healed sternotomy scar. HEART:  PMI not displaced or sustained,S1 and S2 within normal limits, no S3, no S4, no clicks, no rubs, no murmurs ABD:  Flat, positive bowel sounds normal in frequency in pitch, no bruits, no rebound, no guarding, no midline pulsatile mass, no hepatomegaly, no splenomegaly EXT:  2 plus pulses throughout, no edema, no cyanosis no clubbing   EKG:  Sinus rhythm, rate 69, axis within normal limits, intervals within normal limits, no acute ST-T wave changes. 10/02/2015   ASSESSMENT AND PLAN  Coronary artery disease) -  He has been 10 years since bypass.  He is going to have surgery.  I will need to screen with a stress test.  However, he could not walk on a treadmill.  He will need Lexiscan Myoview.  Hyperlipidemia -  He will need a lipid profile.  Preop - This will be based on the above.

## 2015-10-02 ENCOUNTER — Encounter: Payer: Self-pay | Admitting: Cardiology

## 2015-10-02 ENCOUNTER — Ambulatory Visit (INDEPENDENT_AMBULATORY_CARE_PROVIDER_SITE_OTHER): Payer: Medicare Other | Admitting: Cardiology

## 2015-10-02 VITALS — BP 111/64 | HR 69 | Ht 70.0 in | Wt 175.4 lb

## 2015-10-02 DIAGNOSIS — I2510 Atherosclerotic heart disease of native coronary artery without angina pectoris: Secondary | ICD-10-CM

## 2015-10-02 DIAGNOSIS — E785 Hyperlipidemia, unspecified: Secondary | ICD-10-CM

## 2015-10-02 NOTE — Patient Instructions (Signed)
Medication Instructions:  Continue Current Medications  Labwork: Fasting Lipids Liver  Testing/Procedures: Your physician has requested that you have a lexiscan myoview. For further information please visit [email or web address]. Please follow instruction sheet, as given.  Follow-Up: Your physician wants you to follow-up in: 1 Year. You will receive a reminder letter in the mail two months in advance. If you don't receive a letter, please call our office to schedule the follow-up appointment.   Any Other Special Instructions Will Be Listed Below (If Applicable).   If you need a refill on your cardiac medications before your next appointment, please call your pharmacy.

## 2015-10-03 NOTE — Addendum Note (Signed)
Addended by: Vennie Homans on: 10/03/2015 03:01 PM   Modules accepted: Orders

## 2015-10-11 ENCOUNTER — Encounter (HOSPITAL_COMMUNITY): Payer: Medicare Other

## 2015-10-11 ENCOUNTER — Inpatient Hospital Stay (HOSPITAL_COMMUNITY): Admission: RE | Admit: 2015-10-11 | Payer: Self-pay | Source: Ambulatory Visit

## 2015-10-11 ENCOUNTER — Encounter (HOSPITAL_COMMUNITY): Payer: Self-pay

## 2015-12-25 ENCOUNTER — Other Ambulatory Visit: Payer: Self-pay

## 2015-12-25 MED ORDER — SIMVASTATIN 40 MG PO TABS: 40.0000 mg | ORAL_TABLET | Freq: Every day | ORAL | 2 refills | Status: DC

## 2015-12-25 MED ORDER — METOPROLOL TARTRATE 25 MG PO TABS: 25.0000 mg | ORAL_TABLET | Freq: Two times a day (BID) | ORAL | 2 refills | Status: DC

## 2015-12-25 NOTE — Telephone Encounter (Signed)
Rx(s) sent to pharmacy electronically.  

## 2016-01-31 DIAGNOSIS — M9902 Segmental and somatic dysfunction of thoracic region: Secondary | ICD-10-CM | POA: Diagnosis not present

## 2016-01-31 DIAGNOSIS — M5430 Sciatica, unspecified side: Secondary | ICD-10-CM | POA: Diagnosis not present

## 2016-01-31 DIAGNOSIS — M9904 Segmental and somatic dysfunction of sacral region: Secondary | ICD-10-CM | POA: Diagnosis not present

## 2016-01-31 DIAGNOSIS — M9903 Segmental and somatic dysfunction of lumbar region: Secondary | ICD-10-CM | POA: Diagnosis not present

## 2016-02-01 DIAGNOSIS — M9902 Segmental and somatic dysfunction of thoracic region: Secondary | ICD-10-CM | POA: Diagnosis not present

## 2016-02-01 DIAGNOSIS — M9904 Segmental and somatic dysfunction of sacral region: Secondary | ICD-10-CM | POA: Diagnosis not present

## 2016-02-01 DIAGNOSIS — M5430 Sciatica, unspecified side: Secondary | ICD-10-CM | POA: Diagnosis not present

## 2016-02-01 DIAGNOSIS — M9903 Segmental and somatic dysfunction of lumbar region: Secondary | ICD-10-CM | POA: Diagnosis not present

## 2016-03-19 ENCOUNTER — Encounter (HOSPITAL_COMMUNITY): Payer: Medicare Other

## 2016-03-19 ENCOUNTER — Inpatient Hospital Stay (HOSPITAL_COMMUNITY): Admission: RE | Admit: 2016-03-19 | Payer: Self-pay | Source: Ambulatory Visit

## 2016-03-19 ENCOUNTER — Encounter (HOSPITAL_COMMUNITY): Admission: RE | Admit: 2016-03-19 | Payer: Medicare Other | Source: Ambulatory Visit

## 2016-03-27 ENCOUNTER — Encounter (HOSPITAL_COMMUNITY)
Admission: RE | Admit: 2016-03-27 | Discharge: 2016-03-27 | Disposition: A | Discharge status: Home / Self Care | Payer: Medicare Other | Source: Ambulatory Visit | Attending: Cardiology | Admitting: Cardiology

## 2016-03-27 ENCOUNTER — Inpatient Hospital Stay (HOSPITAL_COMMUNITY): Admission: RE | Admit: 2016-03-27 | Payer: Self-pay | Source: Ambulatory Visit

## 2016-03-27 ENCOUNTER — Encounter (HOSPITAL_COMMUNITY): Payer: Self-pay

## 2016-03-27 DIAGNOSIS — I2510 Atherosclerotic heart disease of native coronary artery without angina pectoris: Secondary | ICD-10-CM | POA: Diagnosis not present

## 2016-03-27 LAB — NM MYOCAR MULTI W/SPECT W/WALL MOTION / EF
CHL CUP NUCLEAR SRS: 10
CHL CUP RESTING HR STRESS: 57 {beats}/min
CSEPPHR: 83 {beats}/min
LV sys vol: 44 mL
LVDIAVOL: 98 mL (ref 62–150)
RATE: 0.1
SDS: 4
SSS: 14
TID: 1.09

## 2016-03-27 MED ORDER — SODIUM CHLORIDE 0.9% FLUSH
INTRAVENOUS | Status: AC
  Administered 2016-03-27: 10 mL via INTRAVENOUS
  Filled 2016-03-27: qty 10

## 2016-03-27 MED ORDER — TECHNETIUM TC 99M TETROFOSMIN IV KIT
30.0000 | PACK | Freq: Once | INTRAVENOUS | Status: AC | PRN
  Administered 2016-03-27: 28.6 via INTRAVENOUS

## 2016-03-27 MED ORDER — TECHNETIUM TC 99M TETROFOSMIN IV KIT
10.0000 | PACK | Freq: Once | INTRAVENOUS | Status: AC | PRN
  Administered 2016-03-27: 10.3 via INTRAVENOUS

## 2016-03-27 MED ORDER — REGADENOSON 0.4 MG/5ML IV SOLN
INTRAVENOUS | Status: AC
  Administered 2016-03-27: 0.4 mg via INTRAVENOUS
  Filled 2016-03-27: qty 5

## 2016-04-17 ENCOUNTER — Ambulatory Visit: Payer: Self-pay | Admitting: General Surgery

## 2016-04-17 DIAGNOSIS — K4090 Unilateral inguinal hernia, without obstruction or gangrene, not specified as recurrent: Secondary | ICD-10-CM | POA: Diagnosis not present

## 2016-04-17 NOTE — H&P (Signed)
David Little 04/17/2016 10:38 AM Location: [facility] Patient #: [id number] DOB: 1935/11/27 Married / Language: David Little / Race: White Male  History of Present Illness David Hollingshead MD; 04/17/2016 10:52 AM) The patient is a 80 year old male.   Note:He presents for preoperative visit for left inguinal hernia repair. I saw him last May. He needed a cardiac evaluation. In between now and then he injured himself and his wife got sick. He underwent a stress test last month. Results are that it was low risk and he is felt to be an acceptable candidate for the surgery.  Allergies (April Staton, CMA; 04/17/2016 10:38 AM) No Known Drug Allergies 09/25/2015  Medication History (April Staton, CMA; 04/17/2016 10:38 AM) Metoprolol Tartrate (25MG  Tablet, Oral daily) Active. Simvastatin (40MG  Tablet, Oral daily) Active. Nitroglycerin (0.4MG  Tab Sublingual, Sublingual as needed) Active. Aspirin (325MG  Tablet, Oral daily) Active. Multivitamin Adult (Oral daily) Active. Medications Reconciled    Vitals (April Staton CMA; 04/17/2016 10:39 AM) 04/17/2016 10:39 AM Weight: 172.38 lb Height: 65in Body Surface Area: 1.86 m Body Mass Index: 28.68 kg/m  Temp.: 97.60F(Oral)  Pulse: 75 (Regular)  BP: 118/68 (Sitting, Left Arm, Standard)      Physical Exam David Hollingshead MD; 04/17/2016 10:53 AM)  The physical exam findings are as follows: Note:General-elderly male in no acute distress.  Cardiovascular-regular rate and rhythm.  Lungs-clear  Abdomen-soft and nontender. No umbilical hernia.  GU-left inguinal bulge consistent with hernia that is reducible in the supine position.    Assessment & Plan David Hollingshead MD; 04/17/2016 10:54 AM)  LEFT INGUINAL HERNIA (K40.90) Impression: Preoperative cardiac evaluation is complete.  Plan: Schedule left inguinal hernia repair with mesh. I have explained the procedure, risks, and aftercare of  inguinal hernia repair. Risks include but are not limited to bleeding, infection, wound problems, anesthesia, recurrence, bladder or intestine injury, urinary retention, testicular dysfunction, chronic pain, mesh problems. He seems to understand and agrees to proceed.  Jackolyn Confer, M.D.

## 2016-05-28 ENCOUNTER — Encounter (HOSPITAL_COMMUNITY): Payer: Self-pay

## 2016-05-28 ENCOUNTER — Encounter (HOSPITAL_COMMUNITY)
Admission: RE | Admit: 2016-05-28 | Discharge: 2016-05-28 | Disposition: A | Discharge status: Home / Self Care | Payer: Medicare Other | Source: Ambulatory Visit | Attending: General Surgery | Admitting: General Surgery

## 2016-05-28 DIAGNOSIS — Z7982 Long term (current) use of aspirin: Secondary | ICD-10-CM | POA: Diagnosis not present

## 2016-05-28 DIAGNOSIS — Z01812 Encounter for preprocedural laboratory examination: Secondary | ICD-10-CM

## 2016-05-28 DIAGNOSIS — Z87891 Personal history of nicotine dependence: Secondary | ICD-10-CM | POA: Diagnosis not present

## 2016-05-28 DIAGNOSIS — K4090 Unilateral inguinal hernia, without obstruction or gangrene, not specified as recurrent: Secondary | ICD-10-CM | POA: Insufficient documentation

## 2016-05-28 LAB — BASIC METABOLIC PANEL
ANION GAP: 8 (ref 5–15)
BUN: 14 mg/dL (ref 6–20)
CALCIUM: 9.1 mg/dL (ref 8.9–10.3)
CO2: 24 mmol/L (ref 22–32)
CREATININE: 0.94 mg/dL (ref 0.61–1.24)
Chloride: 108 mmol/L (ref 101–111)
GFR calc Af Amer: >60 mL/min (ref >60)
GLUCOSE: 91 mg/dL (ref 65–99)
Potassium: 4.2 mmol/L (ref 3.5–5.1)
Sodium: 140 mmol/L (ref 135–145)

## 2016-05-28 LAB — CBC
HCT: 40.8 % (ref 39.0–52.0)
Hemoglobin: 13.6 g/dL (ref 13.0–17.0)
MCH: 28.5 pg (ref 26.0–34.0)
MCHC: 33.3 g/dL (ref 30.0–36.0)
MCV: 85.5 fL (ref 78.0–100.0)
PLATELETS: 202 10*3/uL (ref 150–400)
RBC: 4.77 MIL/uL (ref 4.22–5.81)
RDW: 13.3 % (ref 11.5–15.5)
WBC: 7.4 10*3/uL (ref 4.0–10.5)

## 2016-05-28 LAB — SURGICAL PCR SCREEN
MRSA, PCR: NEGATIVE
STAPHYLOCOCCUS AUREUS: POSITIVE — AB

## 2016-05-28 NOTE — Progress Notes (Signed)
10/03/2015- noted in EPIC- EKG.

## 2016-05-28 NOTE — Patient Instructions (Addendum)
KYAN REPKE  05/28/2016   Your procedure is scheduled on: Friday 05/31/2016  Report to [facility] Main  Entrance take [place]  elevators to 3rd floor to  [place] at  [place]  AM.  Call this number if you have problems the morning of surgery [phone]   Remember: ONLY 1 PERSON MAY GO WITH YOU TO SHORT STAY TO GET  READY MORNING OF [facility].    Do not eat food or drink liquids :After Midnight.     Take these medicines the morning of surgery with A SIP OF WATER: Metoprolol (Lopressor)                                  You may not have any metal on your body including hair pins and              piercings  Do not wear jewelry, make-up, lotions, powders or perfumes, deodorant             Do not wear nail polish.  Do not shave  48 hours prior to surgery.              Men may shave face and neck.   Do not bring valuables to the hospital. [place].  Contacts, dentures or bridgework may not be worn into surgery.  Leave suitcase in the car. After surgery it may be brought to your room.     Patients discharged the day of surgery will not be allowed to drive home.  Name and phone number of your driver:spouse- Rena  Special Instructions: N/A              Please read over the following fact sheets you were given: _____________________________________________________________________             [facility] - Preparing for Surgery Before surgery, you can play an important role.  Because skin is not sterile, your skin needs to be as free of germs as possible.  You can reduce the number of germs on your skin by washing with CHG (chlorahexidine gluconate) soap before surgery.  CHG is an antiseptic cleaner which kills germs and bonds with the skin to continue killing germs even after washing. Please DO NOT use if you have an allergy to CHG or antibacterial soaps.  If your skin becomes reddened/irritated  stop using the CHG and inform your nurse when you arrive at Short Stay. Do not shave (including legs and underarms) for at least 48 hours prior to the first CHG shower.  You may shave your face/neck. Please follow these instructions carefully:  1.  Shower with CHG Soap the night before surgery and the  morning of Surgery.  2.  If you choose to wash your hair, wash your hair first as usual with your  normal  shampoo.  3.  After you shampoo, rinse your hair and body thoroughly to remove the  shampoo.                           4.  Use CHG as you would any other liquid soap.  You can apply chg directly  to the skin and wash  Gently with a scrungie or clean washcloth.  5.  Apply the CHG Soap to your body ONLY FROM THE NECK DOWN.   Do not use on face/ open                           Wound or open sores. Avoid contact with eyes, ears mouth and genitals (private parts).                       Wash face,  Genitals (private parts) with your normal soap.             6.  Wash thoroughly, paying special attention to the area where your surgery  will be performed.  7.  Thoroughly rinse your body with warm water from the neck down.  8.  DO NOT shower/wash with your normal soap after using and rinsing off  the CHG Soap.                9.  Pat yourself dry with a clean towel.            10.  Wear clean pajamas.            11.  Place clean sheets on your bed the night of your first shower and do not  sleep with pets. Day of Surgery : Do not apply any lotions/deodorants the morning of surgery.  Please wear clean clothes to the hospital/surgery center.  FAILURE TO FOLLOW THESE INSTRUCTIONS MAY RESULT IN THE CANCELLATION OF YOUR SURGERY PATIENT SIGNATURE_________________________________  NURSE SIGNATURE__________________________________  ________________________________________________________________________

## 2016-05-31 ENCOUNTER — Encounter (HOSPITAL_COMMUNITY)
Admission: RE | Disposition: A | Discharge status: Home / Self Care | Payer: Self-pay | Source: Ambulatory Visit | Attending: General Surgery

## 2016-05-31 ENCOUNTER — Ambulatory Visit (HOSPITAL_COMMUNITY)
Admission: RE | Admit: 2016-05-31 | Discharge: 2016-05-31 | Disposition: A | Discharge status: Home / Self Care | Payer: Medicare Other | Source: Ambulatory Visit | Attending: General Surgery | Admitting: General Surgery

## 2016-05-31 ENCOUNTER — Encounter (HOSPITAL_COMMUNITY): Payer: Self-pay | Admitting: *Deleted

## 2016-05-31 ENCOUNTER — Ambulatory Visit (HOSPITAL_COMMUNITY): Payer: Medicare Other | Admitting: Certified Registered Nurse Anesthetist

## 2016-05-31 DIAGNOSIS — Z7982 Long term (current) use of aspirin: Secondary | ICD-10-CM | POA: Insufficient documentation

## 2016-05-31 DIAGNOSIS — Z87891 Personal history of nicotine dependence: Secondary | ICD-10-CM | POA: Diagnosis not present

## 2016-05-31 DIAGNOSIS — G8918 Other acute postprocedural pain: Secondary | ICD-10-CM | POA: Diagnosis not present

## 2016-05-31 DIAGNOSIS — K4090 Unilateral inguinal hernia, without obstruction or gangrene, not specified as recurrent: Secondary | ICD-10-CM | POA: Diagnosis not present

## 2016-05-31 DIAGNOSIS — I2510 Atherosclerotic heart disease of native coronary artery without angina pectoris: Secondary | ICD-10-CM | POA: Diagnosis not present

## 2016-05-31 DIAGNOSIS — E785 Hyperlipidemia, unspecified: Secondary | ICD-10-CM | POA: Diagnosis not present

## 2016-05-31 HISTORY — PX: INSERTION OF MESH: SHX5868

## 2016-05-31 HISTORY — PX: INGUINAL HERNIA REPAIR: SHX194

## 2016-05-31 SURGERY — REPAIR, HERNIA, INGUINAL, ADULT
Anesthesia: General | Laterality: Left

## 2016-05-31 MED ORDER — FENTANYL CITRATE (PF) 100 MCG/2ML IJ SOLN: 25.0000 ug | INTRAMUSCULAR | Status: DC | PRN

## 2016-05-31 MED ORDER — LIDOCAINE 2% (20 MG/ML) 5 ML SYRINGE
INTRAMUSCULAR | Status: DC | PRN
  Administered 2016-05-31: 100 mg via INTRAVENOUS

## 2016-05-31 MED ORDER — ONDANSETRON HCL 4 MG/2ML IJ SOLN
INTRAMUSCULAR | Status: DC | PRN
  Administered 2016-05-31: 4 mg via INTRAVENOUS

## 2016-05-31 MED ORDER — ONDANSETRON HCL 4 MG/2ML IJ SOLN
INTRAMUSCULAR | Status: AC
  Filled 2016-05-31: qty 2

## 2016-05-31 MED ORDER — LIDOCAINE 2% (20 MG/ML) 5 ML SYRINGE
INTRAMUSCULAR | Status: AC
  Filled 2016-05-31: qty 5

## 2016-05-31 MED ORDER — FENTANYL CITRATE (PF) 100 MCG/2ML IJ SOLN
INTRAMUSCULAR | Status: DC | PRN
  Administered 2016-05-31 (×4): 50 ug via INTRAVENOUS

## 2016-05-31 MED ORDER — BUPIVACAINE HCL (PF) 0.5 % IJ SOLN
INTRAMUSCULAR | Status: DC | PRN
  Administered 2016-05-31: 20 mL

## 2016-05-31 MED ORDER — MIDAZOLAM HCL 5 MG/ML IJ SOLN
1.0000 mg | Freq: Once | INTRAMUSCULAR | Status: AC
  Administered 2016-05-31: 1 mg via INTRAVENOUS

## 2016-05-31 MED ORDER — ROPIVACAINE HCL 7.5 MG/ML IJ SOLN
INTRAMUSCULAR | Status: AC
  Filled 2016-05-31: qty 20

## 2016-05-31 MED ORDER — PROPOFOL 10 MG/ML IV BOLUS
INTRAVENOUS | Status: AC
  Filled 2016-05-31: qty 20

## 2016-05-31 MED ORDER — MIDAZOLAM HCL 2 MG/2ML IJ SOLN
INTRAMUSCULAR | Status: AC
  Filled 2016-05-31: qty 2

## 2016-05-31 MED ORDER — ONDANSETRON HCL 4 MG/2ML IJ SOLN: 4.0000 mg | Freq: Once | INTRAMUSCULAR | Status: DC | PRN

## 2016-05-31 MED ORDER — OXYCODONE HCL 5 MG/5ML PO SOLN
5.0000 mg | Freq: Once | ORAL | Status: DC | PRN
  Filled 2016-05-31: qty 5

## 2016-05-31 MED ORDER — EPHEDRINE 5 MG/ML INJ
INTRAVENOUS | Status: AC
  Filled 2016-05-31: qty 10

## 2016-05-31 MED ORDER — DEXAMETHASONE SODIUM PHOSPHATE 10 MG/ML IJ SOLN
INTRAMUSCULAR | Status: DC | PRN
  Administered 2016-05-31: 10 mg via INTRAVENOUS

## 2016-05-31 MED ORDER — FENTANYL CITRATE (PF) 100 MCG/2ML IJ SOLN
INTRAMUSCULAR | Status: AC
  Filled 2016-05-31: qty 2

## 2016-05-31 MED ORDER — OXYCODONE HCL 5 MG PO TABS: 5.0000 mg | ORAL_TABLET | Freq: Once | ORAL | Status: DC | PRN

## 2016-05-31 MED ORDER — SUCCINYLCHOLINE CHLORIDE 200 MG/10ML IV SOSY
PREFILLED_SYRINGE | INTRAVENOUS | Status: AC
  Filled 2016-05-31: qty 10

## 2016-05-31 MED ORDER — ROCURONIUM BROMIDE 50 MG/5ML IV SOSY
PREFILLED_SYRINGE | INTRAVENOUS | Status: AC
  Filled 2016-05-31: qty 5

## 2016-05-31 MED ORDER — 0.9 % SODIUM CHLORIDE (POUR BTL) OPTIME
TOPICAL | Status: DC | PRN
  Administered 2016-05-31: 1000 mL

## 2016-05-31 MED ORDER — OXYCODONE HCL 5 MG PO TABS: 5.0000 mg | ORAL_TABLET | ORAL | 0 refills | Status: DC | PRN

## 2016-05-31 MED ORDER — DEXAMETHASONE SODIUM PHOSPHATE 10 MG/ML IJ SOLN
INTRAMUSCULAR | Status: AC
  Filled 2016-05-31: qty 1

## 2016-05-31 MED ORDER — FENTANYL CITRATE (PF) 100 MCG/2ML IJ SOLN
50.0000 ug | Freq: Once | INTRAMUSCULAR | Status: AC
  Administered 2016-05-31: 50 ug via INTRAVENOUS

## 2016-05-31 MED ORDER — CEFAZOLIN SODIUM-DEXTROSE 2-4 GM/100ML-% IV SOLN
INTRAVENOUS | Status: AC
  Filled 2016-05-31: qty 100

## 2016-05-31 MED ORDER — EPHEDRINE SULFATE 50 MG/ML IJ SOLN
INTRAMUSCULAR | Status: DC | PRN
  Administered 2016-05-31 (×4): 5 mg via INTRAVENOUS

## 2016-05-31 MED ORDER — PROPOFOL 10 MG/ML IV BOLUS
INTRAVENOUS | Status: DC | PRN
  Administered 2016-05-31: 150 mg via INTRAVENOUS

## 2016-05-31 MED ORDER — LACTATED RINGERS IV SOLN
INTRAVENOUS | Status: DC
  Administered 2016-05-31: 11:00:00 via INTRAVENOUS

## 2016-05-31 MED ORDER — CEFAZOLIN SODIUM-DEXTROSE 2-4 GM/100ML-% IV SOLN
2.0000 g | INTRAVENOUS | Status: AC
  Administered 2016-05-31: 2 g via INTRAVENOUS
  Filled 2016-05-31: qty 100

## 2016-05-31 MED ORDER — BUPIVACAINE HCL (PF) 0.5 % IJ SOLN
INTRAMUSCULAR | Status: AC
  Filled 2016-05-31: qty 30

## 2016-05-31 MED ORDER — FENTANYL CITRATE (PF) 250 MCG/5ML IJ SOLN
INTRAMUSCULAR | Status: AC
  Filled 2016-05-31: qty 5

## 2016-05-31 SURGICAL SUPPLY — 47 items
APL SKNCLS STERI-STRIP NONHPOA (GAUZE/BANDAGES/DRESSINGS) ×1
BENZOIN TINCTURE PRP APPL 2/3 (GAUZE/BANDAGES/DRESSINGS) ×3 IMPLANT
BLADE HEX COATED 2.75 (ELECTRODE) ×3 IMPLANT
BLADE SURG 15 STRL LF DISP TIS (BLADE) ×1 IMPLANT
BLADE SURG 15 STRL SS (BLADE) ×3
BLADE SURG SZ10 CARB STEEL (BLADE) ×3 IMPLANT
CHLORAPREP W/TINT 26ML (MISCELLANEOUS) ×3 IMPLANT
CLOSURE WOUND 1/2 X4 (GAUZE/BANDAGES/DRESSINGS) ×1
COVER SURGICAL LIGHT HANDLE (MISCELLANEOUS) ×3 IMPLANT
DECANTER SPIKE VIAL GLASS SM (MISCELLANEOUS) ×3 IMPLANT
DRAIN PENROSE 18X1/2 LTX STRL (DRAIN) ×2 IMPLANT
DRAPE INCISE IOBAN 66X45 STRL (DRAPES) ×3 IMPLANT
DRAPE LAPAROTOMY TRNSV 102X78 (DRAPE) ×3 IMPLANT
DRAPE UTILITY XL STRL (DRAPES) ×3 IMPLANT
DRSG TEGADERM 2-3/8X2-3/4 SM (GAUZE/BANDAGES/DRESSINGS) IMPLANT
DRSG TEGADERM 4X4.75 (GAUZE/BANDAGES/DRESSINGS) ×2 IMPLANT
DRSG TELFA 3X8 NADH (GAUZE/BANDAGES/DRESSINGS) IMPLANT
ELECT PENCIL ROCKER SW 15FT (MISCELLANEOUS) ×3 IMPLANT
ELECT REM PT RETURN 9FT ADLT (ELECTROSURGICAL) ×3
ELECTRODE REM PT RTRN 9FT ADLT (ELECTROSURGICAL) ×1 IMPLANT
GAUZE SPONGE 4X4 12PLY STRL (GAUZE/BANDAGES/DRESSINGS) ×2 IMPLANT
GLOVE ECLIPSE 8.0 STRL XLNG CF (GLOVE) ×3 IMPLANT
GLOVE INDICATOR 8.0 STRL GRN (GLOVE) ×6 IMPLANT
GOWN STRL REUS W/TWL LRG LVL3 (GOWN DISPOSABLE) ×3 IMPLANT
GOWN STRL REUS W/TWL XL LVL3 (GOWN DISPOSABLE) ×6 IMPLANT
KIT BASIN OR (CUSTOM PROCEDURE TRAY) ×3 IMPLANT
MESH HERNIA 3X6 (Mesh General) ×2 IMPLANT
NDL HYPO 25X1 1.5 SAFETY (NEEDLE) ×1 IMPLANT
NEEDLE HYPO 25X1 1.5 SAFETY (NEEDLE) ×3 IMPLANT
NS IRRIG 1000ML POUR BTL (IV SOLUTION) ×3 IMPLANT
PACK BASIC VI WITH GOWN DISP (CUSTOM PROCEDURE TRAY) ×3 IMPLANT
PAD DRESSING TELFA 3X8 NADH (GAUZE/BANDAGES/DRESSINGS) IMPLANT
PAD TELFA 2X3 NADH STRL (GAUZE/BANDAGES/DRESSINGS) ×2 IMPLANT
PUMP ON Q 100MLX2ML/HR (PAIN MANAGEMENT) IMPLANT
SOL PREP PROV IODINE SCRUB 4OZ (MISCELLANEOUS) ×3 IMPLANT
SPONGE LAP 4X18 X RAY DECT (DISPOSABLE) ×3 IMPLANT
STRIP CLOSURE SKIN 1/2X4 (GAUZE/BANDAGES/DRESSINGS) ×2 IMPLANT
SUT MNCRL AB 4-0 PS2 18 (SUTURE) ×6 IMPLANT
SUT PROLENE 2 0 CT2 30 (SUTURE) ×6 IMPLANT
SUT VIC AB 2-0 SH 18 (SUTURE) ×6 IMPLANT
SUT VIC AB 3-0 SH 27 (SUTURE) ×6
SUT VIC AB 3-0 SH 27XBRD (SUTURE) ×2 IMPLANT
SYR BULB IRRIGATION 50ML (SYRINGE) ×3 IMPLANT
SYR CONTROL 10ML LL (SYRINGE) ×3 IMPLANT
TOWEL OR 17X26 10 PK STRL BLUE (TOWEL DISPOSABLE) ×3 IMPLANT
TOWEL OR NON WOVEN STRL DISP B (DISPOSABLE) ×3 IMPLANT
YANKAUER SUCT BULB TIP 10FT TU (MISCELLANEOUS) ×1 IMPLANT

## 2016-05-31 NOTE — Progress Notes (Signed)
Patient ambulated from 1302 to 1310 and back. Tolerated well. Able to void in bathroom. Patient states his pain level is a one.

## 2016-05-31 NOTE — Progress Notes (Signed)
Assisted Dr. Joslin with left, ultrasound guided, transabdominal plane block. Side rails up, monitors on throughout procedure. See vital signs in flow sheet. Tolerated Procedure well. 

## 2016-05-31 NOTE — Transfer of Care (Signed)
Immediate Anesthesia Transfer of Care Note  Patient: David Little  Procedure(s) Performed: Procedure(s): LEFT INGUINAL HERNIA REPAIR WITH MESH (Left) INSERTION OF MESH (Left)  Patient Location: PACU  Anesthesia Type:General  Level of Consciousness:  sedated, patient cooperative and responds to stimulation  Airway & Oxygen Therapy:Patient Spontanous Breathing and Patient connected to face mask oxgen  Post-op Assessment:  Report given to PACU RN and Post -op Vital signs reviewed and stable  Post vital signs:  Reviewed and stable  Last Vitals:  Vitals:   05/31/16 1150 05/31/16 1200  BP: 114/75 (!) 112/94  Pulse: 63 64  Resp: 17 16  Temp:      Complications: No apparent anesthesia complications

## 2016-05-31 NOTE — H&P (Signed)
David Little  DOB: 26-Jun-1935 Married / Language: David Little / Race: White Male  HPI The patient is a 81 year old male.   Note:He presents for preoperative visit for left inguinal hernia repair. I saw him last May. He needed a cardiac evaluation. In between now and then he injured himself and his wife got sick. He underwent a stress test last month. Results are that it was low risk and he is felt to be an acceptable candidate for the surgery.  Allergies  No Known Drug Allergies    Prior to Admission medications   Medication Sig Start Date End Date Taking? Authorizing Provider  aspirin EC 81 MG tablet Take 1 tablet (81 mg total) by mouth daily. 07/27/14  Yes Minus Breeding, MD  metoprolol tartrate (LOPRESSOR) 25 MG tablet Take 1 tablet (25 mg total) by mouth 2 (two) times daily. 12/25/15  Yes Minus Breeding, MD  Multiple Vitamin (MULTIVITAMIN PO) Take 1 tablet by mouth daily.    Yes Historical Provider, MD  naproxen sodium (ANAPROX) 220 MG tablet Take 440 mg by mouth daily as needed (pain).   Yes Historical Provider, MD  Polyethyl Glycol-Propyl Glycol (SYSTANE OP) Apply 1 drop to eye daily as needed (dry eyes).   Yes Historical Provider, MD  simvastatin (ZOCOR) 40 MG tablet Take 1 tablet (40 mg total) by mouth daily at 6 PM. 12/25/15  Yes Minus Breeding, MD  nitroGLYCERIN (NITROSTAT) 0.4 MG SL tablet Place 1 tablet (0.4 mg total) under the tongue as needed. Patient taking differently: Place 0.4 mg under the tongue every 5 (five) minutes as needed for chest pain.  07/27/14   Minus Breeding, MD    Physical Exam   The physical exam findings are as follows: Note:General-elderly male in no acute distress.  Cardiovascular-regular rate and rhythm.  Lungs-clear  Abdomen-soft and nontender. No umbilical hernia.  GU-left inguinal bulge consistent with hernia that is reducible in the supine position.    Assessment & Plan   LEFT INGUINAL HERNIA  (K40.90) Impression: Preoperative cardiac evaluation is complete.  Plan: Schedule left inguinal hernia repair with mesh. I have explained the procedure, risks, and aftercare of inguinal hernia repair. Risks include but are not limited to bleeding, infection, wound problems, anesthesia, recurrence, bladder or intestine injury, urinary retention, testicular dysfunction, chronic pain, mesh problems. He seems to understand and agrees to proceed.  Jackolyn Confer, M.D.

## 2016-05-31 NOTE — Interval H&P Note (Signed)
History and Physical Interval Note:  05/31/2016 11:53 AM  David Little  has presented today for surgery, with the diagnosis of LEFT INGUINAL HERNIA  The various methods of treatment have been discussed with the patient and family. After consideration of risks, benefits and other options for treatment, the patient has consented to  Procedure(s): LEFT INGUINAL HERNIA REPAIR WITH MESH (Left) INSERTION OF MESH (Left) as a surgical intervention .  The patient's history has been reviewed, patient examined, no change in status, stable for surgery.  I have reviewed the patient's chart and labs.  Questions were answered to the patient's satisfaction.     Cynitha Berte Lenna Sciara

## 2016-05-31 NOTE — Discharge Instructions (Addendum)
General Anesthesia, Adult, Care After These instructions provide you with information about caring for yourself after your procedure. Your health care provider may also give you more specific instructions. Your treatment has been planned according to current medical practices, but problems sometimes occur. Call your health care provider if you have any problems or questions after your procedure. What can I expect after the procedure? After the procedure, it is common to have:  Vomiting.  A sore throat.  Mental slowness. It is common to feel:  Nauseous.  Cold or shivery.  Sleepy.  Tired.  Sore or achy, even in parts of your body where you did not have surgery. Follow these instructions at home: For at least 24 hours after the procedure:  Do not:  Participate in activities where you could fall or become injured.  Drive.  Use heavy machinery.  Drink alcohol.  Take sleeping pills or medicines that cause drowsiness.  Make important decisions or sign legal documents.  Take care of children on your own.  Rest. Eating and drinking  If you vomit, drink water, juice, or soup when you can drink without vomiting.  Drink enough fluid to keep your urine clear or pale yellow.  Make sure you have little or no nausea before eating solid foods.  Follow the diet recommended by your health care provider. General instructions  Have a responsible adult stay with you until you are awake and alert.  Return to your normal activities as told by your health care provider. Ask your health care provider what activities are safe for you.  Take over-the-counter and prescription medicines only as told by your health care provider.  If you smoke, do not smoke without supervision.  Keep all follow-up visits as told by your health care provider. This is important. Contact a health care provider if:  You continue to have nausea or vomiting at home, and medicines are not helpful.  You  cannot drink fluids or start eating again.  You cannot urinate after 8-12 hours.  You develop a skin rash.  You have fever.  You have increasing redness at the site of your procedure. Get help right away if:  You have difficulty breathing.  You have chest pain.  You have unexpected bleeding.  You feel that you are having a life-threatening or urgent problem. This information is not intended to replace advice given to you by your health care provider. Make sure you discuss any questions you have with your health care provider. Document Released: 08/05/2000 Document Revised: 10/02/2015 Document Reviewed: 04/13/2015 Elsevier Interactive Patient Education  2017 [place] _______[facility]   INGUINAL HERNIA REPAIR: POST OP INSTRUCTIONS  Always review your discharge instruction sheet given to you by the facility where your surgery was performed. IF YOU HAVE DISABILITY OR FAMILY LEAVE FORMS, YOU MUST BRING THEM TO THE OFFICE FOR PROCESSING.   DO NOT GIVE THEM TO YOUR DOCTOR.  1. A  prescription for pain medication may be given to you upon discharge.  Take your pain medication as prescribed, if needed.  If narcotic pain medicine is not needed, then you may take acetaminophen (Tylenol) or ibuprofen (Advil) as needed. 2. Take your usually prescribed medications unless otherwise directed. 3. If you need a refill on your pain medication, please contact your pharmacy.  They will contact our office to request authorization. Prescriptions will not be filled after 5 pm or on week-ends. 4. You should follow a light diet the  first 24 hours after arrival home, such as soup and crackers, etc.  Be sure to include lots of fluids daily.  Resume your normal diet the day after surgery. 5. Most patients will experience some swelling and bruising  in the groin and scrotum.  Ice packs and reclining will help.  Swelling and bruising can take several days to resolve.  6. It  is common to experience some constipation if taking pain medication after surgery.  Increasing fluid intake and taking a stool softener (such as Colace) will usually help or prevent this problem from occurring.  A mild laxative (Milk of Magnesia or Miralax) should be taken according to package directions if there are no bowel movements after 48 hours. 7. Unless discharge instructions indicate otherwise, you may remove your bandages 4 days after surgery.  You may shower the day after surgery.  You may have steri-strips (small skin tapes) in place directly over the incision.  These strips should be left on the skin until they fall off.  If your surgeon used skin glue on the incision, you may shower in 24 hours.  The glue will flake off over the next 2-3 weeks.  Any sutures or staples will be removed at the office during your follow-up visit. 8. ACTIVITIES:  You may resume light daily activities beginning the next day--such as daily self-care, walking, climbing stairs--gradually increasing activities as tolerated. Do not lie flat for the first 2-3 days. You may have sexual intercourse when it is comfortable.  Refrain from any heavy lifting or straining-nothing over 10 pounds for 6 weeks.   a. You may drive when you are no longer taking prescription pain medication, you can comfortably wear a seatbelt, and you can safely maneuver your car and apply brakes. b. RETURN TO WORK:  _Desk type work in one week, full duty in 6 weeks._________________________________________________________ 9. You should see your doctor in the office for a follow-up appointment approximately 2-3 weeks after your surgery.  Make sure that you call for this appointment within a day or two after you arrive home to insure a convenient appointment time. 10. OTHER INSTRUCTIONS:  __Restart Aspirin  06/03/16.________________________________________________________________________________________________________________________________________________________________________________________  WHEN TO CALL YOUR DOCTOR: 1. Fever over 101.0 2. Inability to urinate 3. Nausea and/or vomiting 4. Extreme swelling or bruising 5. Continued bleeding from incision. 6. Increased pain, redness, or drainage from the incision  The clinic staff is available to answer your questions during regular business hours.  Please dont hesitate to call and ask to speak to one of the nurses for clinical concerns.  If you have a medical emergency, go to the nearest emergency room or call 911.  A surgeon from [facility] is always on call at the hospital   [address] [place], [place], [place]  [address] ?  [address] [place], [place], [place]   [phone] ? [phone] ? FAX [phone] Web site: [email or web address]

## 2016-05-31 NOTE — Anesthesia Preprocedure Evaluation (Signed)
Anesthesia Evaluation  Patient identified by MRN, date of birth, ID band Patient awake    Reviewed: Allergy & Precautions, NPO status , Patient's Chart, lab work & pertinent test results  Airway Mallampati: II  TM Distance: >3 FB Neck ROM: Full    Dental  (+) Teeth Intact, Dental Advisory Given   Pulmonary former smoker,    breath sounds clear to auscultation       Cardiovascular  Rhythm:Regular Rate:Normal     Neuro/Psych    GI/Hepatic   Endo/Other    Renal/GU      Musculoskeletal   Abdominal   Peds  Hematology   Anesthesia Other Findings   Reproductive/Obstetrics                             Anesthesia Physical Anesthesia Plan  ASA: III  Anesthesia Plan: General and Regional   Post-op Pain Management:    Induction: Intravenous  Airway Management Planned: LMA  Additional Equipment:   Intra-op Plan:   Post-operative Plan:   Informed Consent: I have reviewed the patients History and Physical, chart, labs and discussed the procedure including the risks, benefits and alternatives for the proposed anesthesia with the patient or authorized representative who has indicated his/her understanding and acceptance.     Plan Discussed with: CRNA and Anesthesiologist  Anesthesia Plan Comments:         Anesthesia Quick Evaluation

## 2016-05-31 NOTE — Anesthesia Procedure Notes (Signed)
Procedure Name: LMA Insertion Date/Time: 05/31/2016 12:16 PM Performed by: Maxwell Caul Pre-anesthesia Checklist: Patient identified, Emergency Drugs available, Suction available and Patient being monitored Patient Re-evaluated:Patient Re-evaluated prior to inductionOxygen Delivery Method: Circle system utilized Preoxygenation: Pre-oxygenation with 100% oxygen Intubation Type: IV induction LMA: LMA inserted LMA Size: 5.0 Number of attempts: 1 Placement Confirmation: positive ETCO2 and breath sounds checked- equal and bilateral Tube secured with: Tape Dental Injury: Teeth and Oropharynx as per pre-operative assessment

## 2016-05-31 NOTE — Anesthesia Postprocedure Evaluation (Addendum)
Anesthesia Post Note  Patient: David Little  Procedure(s) Performed: Procedure(s) (LRB): LEFT INGUINAL HERNIA REPAIR WITH MESH (Left) INSERTION OF MESH (Left)  Patient location during evaluation: PACU Anesthesia Type: General and Regional Level of consciousness: awake, awake and alert and oriented Pain management: pain level controlled Vital Signs Assessment: post-procedure vital signs reviewed and stable Respiratory status: spontaneous breathing, nonlabored ventilation and respiratory function stable Cardiovascular status: blood pressure returned to baseline Anesthetic complications: no       Last Vitals:  Vitals:   05/31/16 1422 05/31/16 1511  BP: (!) 169/81 (!) 148/88  Pulse: 83 65  Resp: 16 18  Temp: 36.9 C 36.8 C    Last Pain:  Vitals:   05/31/16 1511  TempSrc: Oral  PainSc: 1                  Tamikia Chowning COKER

## 2016-05-31 NOTE — Anesthesia Procedure Notes (Signed)
Anesthesia Regional Block:  TAP block  Pre-Anesthetic Checklist: ,, timeout performed, Correct Patient, Correct Site, Correct Laterality, Correct Procedure, Correct Position, site marked, Risks and benefits discussed,  Surgical consent,  Pre-op evaluation,  At surgeon's request and post-op pain management  Laterality: Left  Prep: chloraprep       Needles:   Needle Type: Echogenic Stimulator Needle          Additional Needles:  Procedures: ultrasound guided (picture in chart) TAP block Narrative:  Start time: 05/31/2016 11:35 AM End time: 05/31/2016 11:40 AM Injection made incrementally with aspirations every 5 mL.  Performed by: Personally  Anesthesiologist: Kajah Santizo  Additional Notes: 20 cc 0.75% Ropivacaine injected easily.

## 2016-05-31 NOTE — Op Note (Signed)
OPERATIVE NOTE-INGUINAL HERNIA REPAIR  Preoperative diagnosis:  Left inguinal hernia.  Postoperative diagnosis:  Same (indirect)  Procedure:  Left inguinal hernia repair with mesh.  Surgeon:  Jackolyn Confer, M.D.  Anesthesia:  General/LMA, TAP block and local (Marcaine).  EBL < 100 ml  Indication:  This is a 81 year old male with a moderate sized left inguinal hernia who presents for elective repair.  Technique:  He was seen in the holding room and the left groin was marked with my initials. He/She was brought to the operating, placed supine on the operating table, and the anesthetic was administered by the anesthesiologist. The hair in the groin area was clipped as was felt to be necessary. This area was then sterilely prepped and draped. A timeout was performed.  Local anesthetic was infiltrated in the superficial and deep tissues in the left groin.  An incision was made through the skin and subcutaneous tissue until the external oblique aponeurosis was identified.  Local anesthetic was infiltrated deep to the external oblique aponeurosis. The external oblique aponeurosis was divided through the external ring medially and back toward the anterior superior iliac spine laterally. Using blunt dissection, the shelving edge of the inguinal ligament was identified inferiorly and the internal oblique aponeurosis and muscle were identified superiorly. The ilioinguinal nerve was identified and preserved.  The spermatic cord was isolated and a posterior window was made around it. A moderate size indirect  hernia sac was identified and separated from the spermatic cord using blunt dissection. The hernia sac and its contents were reduced through the indirect hernia defect.   A piece of 3" x 6" polypropylene mesh was brought into the field and anchored 1-2 cm medial to the pubic tubercle with 2-0 Prolene suture. The inferior aspect of the mesh was anchored to the shelving edge of the inguinal ligament  with running 2-0 Prolene suture to a level 1-2 cm lateral to the internal ring. A slit was cut in the mesh creating 2 tails. These were wrapped around the spermatic cord. The superior aspect of the mesh was anchored to the internal oblique aponeurosis and muscle with interrupted 2-0 Vicryl sutures. The 2 tails of the mesh were then crossed creating a new internal ring and were anchored to the shelving edge of the inguinal ligament with 2-0 Prolene suture. The tip of a hemostat could be placed through the new aperture. The lateral aspect of the mesh was then tucked deep to the external oblique aponeurosis.  The wound was inspected and hemostasis was adequate. The external oblique aponeurosis was then closed over the mesh and cord with running 3-0 Vicryl suture. The subcutaneous tissue was closed with running 3-0 Vicryl suture. The skin closed with a running 4-0 Monocryl subcuticular stitch.  Steri-Strips and a sterile dressing were applied.  The procedure was well-tolerated without any apparent complications and the he was taken to the recovery room in satisfactory condition.

## 2016-08-29 DIAGNOSIS — M5442 Lumbago with sciatica, left side: Secondary | ICD-10-CM | POA: Diagnosis not present

## 2016-08-29 DIAGNOSIS — M25552 Pain in left hip: Secondary | ICD-10-CM | POA: Diagnosis not present

## 2016-08-29 DIAGNOSIS — M5441 Lumbago with sciatica, right side: Secondary | ICD-10-CM | POA: Diagnosis not present

## 2016-08-29 DIAGNOSIS — M25562 Pain in left knee: Secondary | ICD-10-CM | POA: Diagnosis not present

## 2016-08-29 DIAGNOSIS — M545 Low back pain: Secondary | ICD-10-CM | POA: Diagnosis not present

## 2016-09-09 ENCOUNTER — Telehealth: Payer: Self-pay | Admitting: Oncology

## 2016-09-09 NOTE — Telephone Encounter (Signed)
Patient has an appointment tomorrow and needs to reschedule for another day around 10 am. Please send to scheduler

## 2016-09-10 ENCOUNTER — Ambulatory Visit: Payer: Self-pay | Admitting: Oncology

## 2016-09-11 ENCOUNTER — Telehealth: Payer: Self-pay | Admitting: Oncology

## 2016-09-11 NOTE — Telephone Encounter (Signed)
Spoke with patient re 6/15 f/u °

## 2016-09-12 DIAGNOSIS — M25562 Pain in left knee: Secondary | ICD-10-CM | POA: Diagnosis not present

## 2016-09-12 DIAGNOSIS — M545 Low back pain: Secondary | ICD-10-CM | POA: Diagnosis not present

## 2016-09-12 DIAGNOSIS — T8484XA Pain due to internal orthopedic prosthetic devices, implants and grafts, initial encounter: Secondary | ICD-10-CM | POA: Diagnosis not present

## 2016-09-25 ENCOUNTER — Ambulatory Visit: Payer: Self-pay | Admitting: Physician Assistant

## 2016-09-26 ENCOUNTER — Ambulatory Visit (INDEPENDENT_AMBULATORY_CARE_PROVIDER_SITE_OTHER): Payer: Medicare Other | Admitting: Pediatrics

## 2016-09-26 ENCOUNTER — Encounter: Payer: Self-pay | Admitting: Pediatrics

## 2016-09-26 VITALS — BP 101/63 | HR 62 | Temp 97.1°F | Ht 70.0 in | Wt 169.6 lb

## 2016-09-26 DIAGNOSIS — I2510 Atherosclerotic heart disease of native coronary artery without angina pectoris: Secondary | ICD-10-CM | POA: Diagnosis not present

## 2016-09-26 DIAGNOSIS — H6121 Impacted cerumen, right ear: Secondary | ICD-10-CM

## 2016-09-26 DIAGNOSIS — E785 Hyperlipidemia, unspecified: Secondary | ICD-10-CM | POA: Diagnosis not present

## 2016-09-26 NOTE — Progress Notes (Signed)
Subjective:   Patient ID: David Little, male    DOB: 1935/12/14, 81 y.o.   MRN: [id number] CC: New Patient (Initial Visit) and Ear Pain (Bilateral)  HPI: David Little is a 81 y.o. male presenting for New Patient (Initial Visit) and Ear Pain (Bilateral)  Last seen in our office in 2014. Following with oncology for lymphoma history treated with chemotherapy in 2003, now in clinical remission, has appt next month. CAD s/p CABG in 2007: following with cardiology Feels able to do what he wants to do, no limitation in exercise tolerance No CP Says he had normal stress test earlier this year before recent surgery, though not able to see those records in system Today is bothered by ear wax. Wears hearing aids, has noticed more wax on aids past few days, thinks his hearing is getting affected No other symptoms, otherwise feeling well No fevers, cough, sinus pressure, chest pressure   Past Medical History:  Diagnosis Date  . Coronary artery disease    s/p CABG in 2007 for severe left main disease  . Hypercholesterolemia   . Hyperlipidemia   . Lymphoma ([place]) 2003   without recurrence  . Myocardial infarction ([place])   . Normal nuclear stress test March 2012   EF 55%. No ischemia. Mild inferior scar  . Skin cancer of face    History reviewed. No pertinent family history. Social History   Social History  . Marital status: Married    Spouse name: N/A  . Number of children: N/A  . Years of education: N/A   Social History Main Topics  . Smoking status: Former Smoker    Packs/day: 1.00    Years: 30.00    Quit date: 09/22/1973  . Smokeless tobacco: Current User    Types: Chew  . Alcohol use No  . Drug use: No  . Sexual activity: Not Asked   Other Topics Concern  . None   Social History Narrative  . None   ROS: All systems negative other than what is in HPI  Objective:    BP 101/63   Pulse 62   Temp 97.1 F (36.2 C) (Oral)   Ht 5\' 10"  (1.778 m)   Wt 169 lb 9.6  oz (76.9 kg)   BMI 24.34 kg/m   Wt Readings from Last 3 Encounters:  09/26/16 169 lb 9.6 oz (76.9 kg)  05/31/16 172 lb 9.6 oz (78.3 kg)  05/28/16 172 lb 9.6 oz (78.3 kg)    Gen: NAD, alert, cooperative with exam, NCAT EYES: EOMI, no conjunctival injection, or no icterus ENT:  L TM shiny gray, no effusion, R TM obscured by impacted cerumen, OP without erythema LYMPH: no cervical LAD CV: NRRR, normal S1/S2, no murmur, distal pulses 2+ b/l Resp: CTABL, no wheezes, normal WOB Abd: +BS, soft, NTND. no guarding or organomegaly Ext: No edema, warm Neuro: Alert and oriented, strength equal b/l UE and LE, coordination grossly normal  MSK: normal muscle bulk  Assessment & Plan:  David Little was seen today for new patient (initial visit) and ear wax problem.  Diagnoses and all orders for this visit:  Impacted cerumen of right ear Discussed options, pt wants to proceed with irrigation Impacted cerumen removal: After procedure described to patient and patient agreed with proceeding, cerumen impaction was removed from both ears using irrigation. TMs pearly gray following procedure. Pt tolerated procedure well.   Coronary artery disease involving native coronary artery of native heart without angina pectoris Following with cardiology, asymptomatic On metoprolol,  statin  Hyperlipidemia, unspecified hyperlipidemia type Stable on simvastatin nightly  Follow up plan: 6 mo, sooner if needed Assunta Found, MD [place]

## 2016-10-25 ENCOUNTER — Ambulatory Visit: Payer: Self-pay | Admitting: Oncology

## 2016-12-10 DIAGNOSIS — C44629 Squamous cell carcinoma of skin of left upper limb, including shoulder: Secondary | ICD-10-CM | POA: Diagnosis not present

## 2016-12-10 DIAGNOSIS — L570 Actinic keratosis: Secondary | ICD-10-CM | POA: Diagnosis not present

## 2016-12-10 DIAGNOSIS — C44622 Squamous cell carcinoma of skin of right upper limb, including shoulder: Secondary | ICD-10-CM | POA: Diagnosis not present

## 2016-12-10 DIAGNOSIS — D485 Neoplasm of uncertain behavior of skin: Secondary | ICD-10-CM | POA: Diagnosis not present

## 2016-12-23 ENCOUNTER — Telehealth: Payer: Self-pay | Admitting: Pediatrics

## 2016-12-23 NOTE — Telephone Encounter (Signed)
Please contact pt--we received part of a report from dermatologist, looks like he had recent skin biopsy with dermatology that did show skin cancer (SCC). Does he have follow up with dermatology scheduled for more treatment of the lesions?

## 2016-12-23 NOTE — Telephone Encounter (Signed)
Patient states that he is to follow up with the skin center in two weeks. He is not sure what plan they have for him yet. They are supposed to discuss the plans at that appt.

## 2016-12-23 NOTE — Telephone Encounter (Signed)
That's just fine, just wanted to make sure he was going to see them back. Thanks.

## 2017-01-02 NOTE — Addendum Note (Signed)
Addendum  created 01/02/17 1003 by Roberts Gaudy, MD   Sign clinical note

## 2017-01-03 ENCOUNTER — Other Ambulatory Visit: Payer: Self-pay | Admitting: Cardiology

## 2017-01-06 NOTE — Telephone Encounter (Signed)
REFILL 

## 2017-01-14 DIAGNOSIS — C44629 Squamous cell carcinoma of skin of left upper limb, including shoulder: Secondary | ICD-10-CM | POA: Diagnosis not present

## 2017-01-14 DIAGNOSIS — L905 Scar conditions and fibrosis of skin: Secondary | ICD-10-CM | POA: Diagnosis not present

## 2017-01-14 DIAGNOSIS — C44622 Squamous cell carcinoma of skin of right upper limb, including shoulder: Secondary | ICD-10-CM | POA: Diagnosis not present

## 2017-02-24 ENCOUNTER — Other Ambulatory Visit: Payer: Self-pay | Admitting: Cardiology

## 2017-03-13 DIAGNOSIS — D0462 Carcinoma in situ of skin of left upper limb, including shoulder: Secondary | ICD-10-CM | POA: Diagnosis not present

## 2017-03-13 DIAGNOSIS — D1801 Hemangioma of skin and subcutaneous tissue: Secondary | ICD-10-CM | POA: Diagnosis not present

## 2017-03-13 DIAGNOSIS — L814 Other melanin hyperpigmentation: Secondary | ICD-10-CM | POA: Diagnosis not present

## 2017-03-13 DIAGNOSIS — D485 Neoplasm of uncertain behavior of skin: Secondary | ICD-10-CM | POA: Diagnosis not present

## 2017-03-13 DIAGNOSIS — L570 Actinic keratosis: Secondary | ICD-10-CM | POA: Diagnosis not present

## 2017-03-13 DIAGNOSIS — L821 Other seborrheic keratosis: Secondary | ICD-10-CM | POA: Diagnosis not present

## 2017-03-19 ENCOUNTER — Other Ambulatory Visit: Payer: Self-pay | Admitting: Cardiology

## 2017-03-19 NOTE — Telephone Encounter (Signed)
Rx has been sent to the pharmacy electronically. ° °

## 2017-04-16 ENCOUNTER — Other Ambulatory Visit: Payer: Self-pay | Admitting: Cardiology

## 2017-04-18 ENCOUNTER — Other Ambulatory Visit: Payer: Self-pay | Admitting: Cardiology

## 2017-04-28 ENCOUNTER — Telehealth: Payer: Self-pay | Admitting: Cardiology

## 2017-04-28 NOTE — Telephone Encounter (Signed)
New message  Pt verbalized that she is calling for the rn  To go over his medications

## 2017-04-28 NOTE — Telephone Encounter (Signed)
Unable to reach pt or leave a message  

## 2017-04-30 NOTE — Telephone Encounter (Signed)
Unable to reach pt or leave a message  

## 2017-05-01 ENCOUNTER — Ambulatory Visit: Payer: Medicare Other | Admitting: Physician Assistant

## 2017-05-01 ENCOUNTER — Encounter: Payer: Self-pay | Admitting: Physician Assistant

## 2017-05-01 VITALS — BP 116/60 | HR 75 | Ht 70.0 in | Wt 166.8 lb

## 2017-05-01 DIAGNOSIS — E782 Mixed hyperlipidemia: Secondary | ICD-10-CM

## 2017-05-01 DIAGNOSIS — I10 Essential (primary) hypertension: Secondary | ICD-10-CM | POA: Diagnosis not present

## 2017-05-01 DIAGNOSIS — I2510 Atherosclerotic heart disease of native coronary artery without angina pectoris: Secondary | ICD-10-CM | POA: Diagnosis not present

## 2017-05-01 DIAGNOSIS — Z79899 Other long term (current) drug therapy: Secondary | ICD-10-CM | POA: Diagnosis not present

## 2017-05-01 LAB — COMPREHENSIVE METABOLIC PANEL
A/G RATIO: 1.8 (ref 1.2–2.2)
ALBUMIN: 4.3 g/dL (ref 3.5–4.8)
ALT: 11 IU/L (ref 0–44)
AST: 20 IU/L (ref 0–40)
Alkaline Phosphatase: 63 IU/L (ref 39–117)
BUN / CREAT RATIO: 19 (ref 10–24)
BUN: 21 mg/dL (ref 8–27)
Bilirubin Total: 0.4 mg/dL (ref 0.0–1.2)
CALCIUM: 9.5 mg/dL (ref 8.6–10.2)
CO2: 25 mmol/L (ref 20–29)
CREATININE: 1.11 mg/dL (ref 0.76–1.27)
Chloride: 107 mmol/L — ABNORMAL HIGH (ref 96–106)
GFR, EST AFRICAN AMERICAN: 73 mL/min/{1.73_m2} (ref >59)
GFR, EST NON AFRICAN AMERICAN: 63 mL/min/{1.73_m2} (ref >59)
GLOBULIN, TOTAL: 2.4 g/dL (ref 1.5–4.5)
Glucose: 73 mg/dL (ref 65–99)
POTASSIUM: 4.8 mmol/L (ref 3.5–5.2)
SODIUM: 143 mmol/L (ref 134–144)
Total Protein: 6.7 g/dL (ref 6.0–8.5)

## 2017-05-01 LAB — LIPID PANEL
CHOL/HDL RATIO: 2.2 ratio (ref 0.0–5.0)
Cholesterol, Total: 134 mg/dL (ref 100–199)
HDL: 61 mg/dL (ref >39)
LDL CALC: 53 mg/dL (ref 0–99)
Triglycerides: 102 mg/dL (ref 0–149)
VLDL Cholesterol Cal: 20 mg/dL (ref 5–40)

## 2017-05-01 MED ORDER — NITROGLYCERIN 0.4 MG SL SUBL: 0.4000 mg | SUBLINGUAL_TABLET | SUBLINGUAL | 4 refills | Status: DC | PRN

## 2017-05-01 MED ORDER — NITROGLYCERIN 0.4 MG SL SUBL
0.4000 mg | SUBLINGUAL_TABLET | SUBLINGUAL | 99 refills | Status: DC | PRN
Start: 2017-05-01 — End: 2017-05-01

## 2017-05-01 MED ORDER — SIMVASTATIN 40 MG PO TABS: 40.0000 mg | ORAL_TABLET | Freq: Every day | ORAL | 0 refills | Status: DC

## 2017-05-01 MED ORDER — SIMVASTATIN 40 MG PO TABS: 40.0000 mg | ORAL_TABLET | Freq: Every day | ORAL | 3 refills | Status: DC

## 2017-05-01 MED ORDER — METOPROLOL TARTRATE 25 MG PO TABS: 25.0000 mg | ORAL_TABLET | Freq: Two times a day (BID) | ORAL | 0 refills | Status: DC

## 2017-05-01 MED ORDER — METOPROLOL TARTRATE 25 MG PO TABS: 25.0000 mg | ORAL_TABLET | Freq: Two times a day (BID) | ORAL | 3 refills | Status: DC

## 2017-05-01 NOTE — Progress Notes (Signed)
Cardiology Office Note   Date:  05/01/2017   ID:  David Little, David Little 07-02-1935, MRN [id number]  PCP:  Eustaquio Maize, MD  Cardiologist: Dr. Percival Spanish, 10/01/2015 Rosaria Ferries, PA-C    History of Present Illness: David Little is a 81 y.o. male with a history of CABG 2007 w/ LIMA-LAD, SVG-D-OM2, HTN, HLD, MV ok 2012, lymphoma 2003  David Little presents for cardiology follow up.   He admits he does not exercise enough. He is limited by his L knee replacement. He wants to go to the gym and get his legs stronger.   He never gets chest pain. He wants new rx for nitro, it is 79 years old. He is able to walk up stairs and up hills without stopping. He can work in the yard about half a day, then has to rest.  No LE edema, no orthopnea or PND. His major anginal symptom prior to his MI was DOE, not having that now.   No palpitations, no presyncope or syncope.    Past Medical History:  Diagnosis Date  . Coronary artery disease    s/p CABG in 2007 for severe left main disease  . Hypercholesterolemia   . Hyperlipidemia   . Lymphoma ([place]) 2003   without recurrence  . Myocardial infarction ([place])   . Normal nuclear stress test March 2012   EF 55%. No ischemia. Mild inferior scar  . Skin cancer of face     Past Surgical History:  Procedure Laterality Date  . CORONARY ARTERY BYPASS GRAFT  2007   Had coronary artery bypass grafting for severe left main coronary artery stenosis--Had follow up stress cardiolite study in February 2010 which showed EF of 55% with mild inferior hypokinesia--There was no ischemia  . INGUINAL HERNIA REPAIR Left 05/31/2016   Procedure: LEFT INGUINAL HERNIA REPAIR WITH MESH;  Surgeon: Jackolyn Confer, MD;  [facility]: [facility];  Service: General;  Laterality: Left;  . INSERTION OF MESH Left 05/31/2016   Procedure: INSERTION OF MESH;  Surgeon: Jackolyn Confer, MD;  [facility]: [facility];  Service: General;  Laterality: Left;  . TOTAL KNEE ARTHROPLASTY  Left 09/29/2013   Procedure: TOTAL KNEE ARTHROPLASTY;  Surgeon: Alta Corning, MD;  Location: [facility];  Service: Orthopedics;  Laterality: Left;    Current Outpatient Medications  Medication Sig Dispense Refill  . metoprolol tartrate (LOPRESSOR) 25 MG tablet Take 1 tablet (25 mg total) by mouth 2 (two) times daily. Final warning please contact office for additional refills 30 tablet 0  . Multiple Vitamin (MULTIVITAMIN PO) Take 1 tablet by mouth daily.     . naproxen sodium (ANAPROX) 220 MG tablet Take 440 mg by mouth daily as needed (pain).    . nitroGLYCERIN (NITROSTAT) 0.4 MG SL tablet Place 1 tablet (0.4 mg total) under the tongue as needed. 25 tablet prn  . Polyethyl Glycol-Propyl Glycol (SYSTANE OP) Apply 1 drop to eye daily as needed (dry eyes).    . simvastatin (ZOCOR) 40 MG tablet Take 1 tablet (40 mg total) by mouth daily at 6 PM. Final warning please contact office for additional refills 15 tablet 0   No current facility-administered medications for this visit.     Allergies:   Patient has no known allergies.    Social History:  The patient  reports that he quit smoking about 43 years ago. He has a 30.00 pack-year smoking history. His smokeless tobacco use includes chew. He reports that he does not drink alcohol  or use drugs.   Family History:  The patient's family history is not on file.    ROS:  Please see the history of present illness. All other systems are reviewed and negative.    PHYSICAL EXAM: VS:  BP 116/60   Pulse 75   Ht 5\' 10"  (1.778 m)   Wt 166 lb 12.8 oz (75.7 kg)   BMI 23.93 kg/m  , BMI Body mass index is 23.93 kg/m. GEN: Well nourished, well developed, male in no acute distress  HEENT: normal for age  Neck: no JVD, no carotid bruit, no masses Cardiac: RRR; soft murmur, no rubs, or gallops Respiratory:  clear to auscultation bilaterally, normal work of breathing GI: soft, nontender, nondistended, + BS MS: no deformity or atrophy; trace pedal edema;  distal pulses are 2+ in all 4 extremities   Skin: warm and dry, no rash Neuro:  Strength and sensation are intact Psych: euthymic mood, full affect   EKG:  EKG is ordered today. The ekg ordered today demonstrates sinus rhythm, heart rate 75, inferior Q waves are new but seen only in one lead, and aVF amplitude is decreased from previous, otherwise no changes.   Recent Labs: 05/28/2016: BUN 14; Creatinine, Ser 0.94; Hemoglobin 13.6; Platelets 202; Potassium 4.2; Sodium 140    Lipid Panel    Component Value Date/Time   CHOL 148 07/19/2013 0822   TRIG 83 07/19/2013 0822   HDL 58 07/19/2013 0822   CHOLHDL 2.6 07/19/2013 0822   CHOLHDL 3 01/07/2011 0923   VLDL 19.4 01/07/2011 0923   LDLCALC 73 07/19/2013 0822     Wt Readings from Last 3 Encounters:  05/01/17 166 lb 12.8 oz (75.7 kg)  09/26/16 169 lb 9.6 oz (76.9 kg)  05/31/16 172 lb 9.6 oz (78.3 kg)     Other studies Reviewed: Additional studies/ records that were reviewed today include: Office notes, hospital records and testing.  ASSESSMENT AND PLAN:  1.  CAD: He is not having any ischemic symptoms.  We will refill his medications.  He is encouraged to carry the nitroglycerin with him at all times and refill it regularly.  2.  Dyslipidemia: Labs were ordered at his last office visit but never performed.  We will get a complete metabolic profile and a lipid profile today.  It would be better if the patient is fasting, but if not, we will get the labs anyway.  3.  Hypertension: His blood pressure is well controlled on current medications.   Current medicines are reviewed at length with the patient today.  The patient does not have concerns regarding medicines.  The following changes have been made:  no change ho Labs/ tests ordered today include:   Orders Placed This Encounter  Procedures  . EKG 12-Lead     Disposition:   FU with Dr. Percival Spanish  Signed, Rosaria Ferries, PA-C  05/01/2017 10:47 AM    [place] Phone: [phone]; Fax: [phone]  This note was written with the assistance of speech recognition software. Please excuse any transcriptional errors.

## 2017-05-01 NOTE — Patient Instructions (Signed)
Medication Instructions:  NO CHANGES  If you need a refill on your cardiac medications before your next appointment, please call your pharmacy.  Labwork: CMP AND LIPID PANEL TODAY HERE IN OUR OFFICE AT [facility]  You do NOT need an appointment for our lab. Once in our office lobby there is a podium to the right of the check-in desk where you are to sign-in and ring a doorbell to alert [place] you are here. Lab is open Monday-Friday from 8:00am to 4:00pm; and is closed for lunch from 12:45p-1:45pm   Follow-Up: Your physician wants you to follow-up in: [place] should receive a reminder letter in the mail two months in advance. If you do not receive a letter, please call our office October 2019 to schedule the December 2019 follow-up appointment.   Thank you for choosing [facility] at [facility]!!

## 2017-05-02 NOTE — Telephone Encounter (Signed)
Unable to contact pt will await call back to discuss

## 2017-05-07 ENCOUNTER — Other Ambulatory Visit: Payer: Self-pay

## 2017-05-07 MED ORDER — SIMVASTATIN 40 MG PO TABS: 40.0000 mg | ORAL_TABLET | Freq: Every day | ORAL | 0 refills | Status: DC

## 2017-05-07 MED ORDER — METOPROLOL TARTRATE 25 MG PO TABS: 25.0000 mg | ORAL_TABLET | Freq: Two times a day (BID) | ORAL | 0 refills | Status: DC

## 2017-06-13 ENCOUNTER — Telehealth: Payer: Self-pay | Admitting: Cardiology

## 2017-06-13 NOTE — Telephone Encounter (Signed)
Received call from patient.  Patient reports he has been experiencing episodes of CP and SOB x 1 week.   Reports this is occurring when he lays down at night.   Denies CP at current.   Denies N/V/D.  Has not tried to take NTG when this occurred. Patient does not take BP, does not have a way to take this at home.    Patient is requesting an appt for evaluation.   Appt made for Tuesday 2/5 with Almyra Deforest PA.   Advised patient is symptoms reoccur and/or worsen to proceed to ER for evaluation.  Patient agreed and verbalized understanding.

## 2017-06-16 ENCOUNTER — Ambulatory Visit: Payer: Self-pay | Admitting: Physician Assistant

## 2017-06-17 ENCOUNTER — Ambulatory Visit: Payer: Medicare Other | Admitting: Physician Assistant

## 2017-06-17 ENCOUNTER — Encounter: Payer: Self-pay | Admitting: Physician Assistant

## 2017-06-17 VITALS — BP 133/67 | HR 66 | Ht 70.0 in | Wt 165.8 lb

## 2017-06-17 DIAGNOSIS — I10 Essential (primary) hypertension: Secondary | ICD-10-CM | POA: Diagnosis not present

## 2017-06-17 DIAGNOSIS — R079 Chest pain, unspecified: Secondary | ICD-10-CM

## 2017-06-17 DIAGNOSIS — E785 Hyperlipidemia, unspecified: Secondary | ICD-10-CM

## 2017-06-17 DIAGNOSIS — C8590 Non-Hodgkin lymphoma, unspecified, unspecified site: Secondary | ICD-10-CM | POA: Diagnosis not present

## 2017-06-17 DIAGNOSIS — I25709 Atherosclerosis of coronary artery bypass graft(s), unspecified, with unspecified angina pectoris: Secondary | ICD-10-CM

## 2017-06-17 MED ORDER — ASPIRIN EC 81 MG PO TBEC: 81.0000 mg | DELAYED_RELEASE_TABLET | Freq: Every day | ORAL | 3 refills | Status: AC

## 2017-06-17 NOTE — Progress Notes (Signed)
Cardiology Office Note    Date:  06/18/2017   ID:  David Little, David Little 11-27-1935, MRN [id number]  PCP:  Chipper Herb, MD  Cardiologist:  Dr. Percival Spanish   Chief Complaint  Patient presents with  . Follow-up    seen for Dr. Percival Spanish    History of Present Illness:  David Little is a 82 y.o. male with PMH of CAD s/p CABG 2007 with LIMA to LAD, SVG-D-OM2, HTN, HLD, and non-Hodgkin's lymphoma treated with chemo but no radiation 2003.  He had a inferior MI in July 2003 that was treated with angioplasty.  The RCA had a high anterior takeoff and was relatively small, therefore was treated with balloon angioplasty.  The previous cardiac catheterization on 04/02/2006 showed left main dissection around heavily calcified area, 90% distal left main, 70-80% diagonal lesion, 30-40% irregularities in the AV groove, overall EF 55-60%.  He eventually underwent CABG x3 with LIMA to LAD, sequential SVG to D1 and OM 2 by Dr. Roxan Hockey on 04/02/2006.  Myoview performed on 07/19/2010 was low risk, mild scar at the base of the inferior wall, no ischemia.  As part of preoperative clearance, he also had a Myoview in November 2017 that was normal.  Patient presents today with complaint of intermittent chest pain for the past 3 weeks.  He typically noticed left-sided chest pain at rest when he is lying down.  He denies any exacerbating factors such as deep inspiration, body rotation or palpation.  He has been under a great deal of stress as his wife has been sick with multiple medical visits.  Otherwise he has only had trace ankle edema, no orthopnea or PND.  His chest pain seems to be self resolving.  I recommended Lexiscan Myoview given his atypical features of chest pain.  If Myoview is negative, I would not recommend any further workup and he can follow-up with Dr. Percival Spanish in 1 year as previously recommended.   Past Medical History:  Diagnosis Date  . Coronary artery disease 2007   s/p CABG in 2007 for  severe left main disease  . Hypercholesterolemia   . Hyperlipidemia   . Lymphoma ([place]) 2003   without recurrence  . Myocardial infarction ([place]) 2007  . Normal nuclear stress test March 2012   EF 55%. No ischemia. Mild inferior scar  . Skin cancer of face     Past Surgical History:  Procedure Laterality Date  . CORONARY ARTERY BYPASS GRAFT  2007   Had coronary artery bypass grafting for severe left main coronary artery stenosis--Had follow up stress cardiolite study in February 2010 which showed EF of 55% with mild inferior hypokinesia--There was no ischemia  . INGUINAL HERNIA REPAIR Left 05/31/2016   Procedure: LEFT INGUINAL HERNIA REPAIR WITH MESH;  Surgeon: Jackolyn Confer, MD;  [facility];  Service: General;  Laterality: Left;  . INSERTION OF MESH Left 05/31/2016   Procedure: INSERTION OF MESH;  Surgeon: Jackolyn Confer, MD;  [facility];  Service: General;  Laterality: Left;  . TOTAL KNEE ARTHROPLASTY Left 09/29/2013   Procedure: TOTAL KNEE ARTHROPLASTY;  Surgeon: Alta Corning, MD;  Location: [facility];  Service: Orthopedics;  Laterality: Left;    Current Medications: Outpatient Medications Prior to Visit  Medication Sig Dispense Refill  . metoprolol tartrate (LOPRESSOR) 25 MG tablet Take 1 tablet (25 mg total) by mouth 2 (two) times daily. 30 tablet 0  . Multiple Vitamin (MULTIVITAMIN PO) Take 1 tablet by mouth daily.     Marland Kitchen  naproxen sodium (ANAPROX) 220 MG tablet Take 440 mg by mouth daily as needed (pain).    . nitroGLYCERIN (NITROSTAT) 0.4 MG SL tablet Place 1 tablet (0.4 mg total) under the tongue as needed. 25 tablet 4  . Polyethyl Glycol-Propyl Glycol (SYSTANE OP) Apply 1 drop to eye daily as needed (dry eyes).    . simvastatin (ZOCOR) 40 MG tablet Take 1 tablet (40 mg total) by mouth daily at 6 PM. 15 tablet 0   No facility-administered medications prior to visit.      Allergies:   Patient has no known allergies.   Social History   Socioeconomic History  .  Marital status: Married    Spouse name: None  . Number of children: None  . Years of education: None  . Highest education level: None  Social Needs  . Financial resource strain: None  . Food insecurity - worry: None  . Food insecurity - inability: None  . Transportation needs - medical: None  . Transportation needs - non-medical: None  Occupational History  . Occupation: Retired  Tobacco Use  . Smoking status: Former Smoker    Packs/day: 1.00    Years: 30.00    Pack years: 30.00    Last attempt to quit: 09/22/1973    Years since quitting: 43.7  . Smokeless tobacco: Current User    Types: Chew  Substance and Sexual Activity  . Alcohol use: No  . Drug use: No  . Sexual activity: None  Other Topics Concern  . None  Social History Narrative   Pt lives with wife in [place].      Family History:  The patient's family history includes Cancer (age of onset: 13) in his father; Heart attack (age of onset: 40) in his brother; Unexplained death (age of onset: 43) in his mother.   ROS:   Please see the history of present illness.    ROS All other systems reviewed and are negative.   PHYSICAL EXAM:   VS:  BP 133/67   Pulse 66   Ht 5\' 10"  (1.778 m)   Wt 165 lb 12.8 oz (75.2 kg)   BMI 23.79 kg/m    GEN: Well nourished, well developed, in no acute distress  HEENT: normal  Neck: no JVD, carotid bruits, or masses Cardiac: RRR; no murmurs, rubs, or gallops,no edema  Respiratory:  clear to auscultation bilaterally, normal work of breathing GI: soft, nontender, nondistended, + BS MS: no deformity or atrophy  Skin: warm and dry, no rash Neuro:  Alert and Oriented x 3, Strength and sensation are intact Psych: euthymic mood, full affect  Wt Readings from Last 3 Encounters:  06/17/17 165 lb 12.8 oz (75.2 kg)  05/01/17 166 lb 12.8 oz (75.7 kg)  09/26/16 169 lb 9.6 oz (76.9 kg)      Studies/Labs Reviewed:   EKG:  EKG is ordered today.  The ekg ordered today demonstrates NSR  without significant ST-T wave changes  Recent Labs: 05/01/2017: ALT 11; BUN 21; Creatinine, Ser 1.11; Potassium 4.8; Sodium 143   Lipid Panel    Component Value Date/Time   CHOL 134 05/01/2017 1112   TRIG 102 05/01/2017 1112   HDL 61 05/01/2017 1112   CHOLHDL 2.2 05/01/2017 1112   CHOLHDL 3 01/07/2011 0923   VLDL 19.4 01/07/2011 0923   LDLCALC 53 05/01/2017 1112    Additional studies/ records that were reviewed today include:   Cath 04/02/2006 ANGIOGRAPHIC DATA:  1. Left main coronary artery has a dissection around  a heavily      calcified area.  There could be a 90% distal left main, but it is      hazy, and dissection is present with what would appear to be      probably a double lumen.  There is not staining of contrast, but      there is a large area of lack of opacity and a very tight 90-plus      percent stenosis at the origin of the left circumflex and left      anterior descending coronaries.  2. Left anterior descending.  The left anterior descending has      calcification as well as irregularities.  The diagonal vessel has      two 70-80% focal stenoses present.  The left anterior descending is      a long vessel that wraps around the apex.  3. Left circumflex.  The left circumflex has a large bifurcating      marginal vessel and a posterolateral branch continuing near the AV      groove.  There are irregularities but no greater than 30-40% in      these vessels.  4. Right coronary artery.  His right coronary artery is a small      vessel.  It has a high anterior origin but it does have a posterior      descending vessel.  It has irregularities but no focal narrowing.  5. The left ventricular angiogram was performed in the RAO position.      Overall cardiac size and silhouette are normal.  The global      ejection fraction would be estimated to be 55-60%.  Regional wall      motion is normal.   OVERALL IMPRESSION:  1. Complex severe stenosis in the left main  coronary artery with      probable spontaneous dissection in the left main.  2. Severe stenosis at the origin of the left anterior descending and      left circumflex as part of the complex dissection plaque in the      left main.  3. Irregularities in the left anterior descending with severe stenosis      in the diagonal branch.  4. Mild to moderate stenosis in the left circumflex.  5. Small right coronary artery with minimal atherosclerosis.  6. Essentially normal left ventricular global function.    Myoview 07/19/2010 QPS Raw Data Images:  Patient motion noted; appropriate software correction applied. Stress Images:  mild decreased activity at the base of the inferior wall.Marland Kitchen Rest Images:  same as stress Subtraction (SDS):  No evidence of ischemia. Transient Ischemic Dilatation:  1.08  (Normal <1.22)  Lung/Heart Ratio:  .33  (Normal <0.45)  Quantitative Gated Spect Images QGS EDV:  96 ml QGS ESV:  43 ml QGS EF:  55 % QGS cine images:  Mild hypokinesis of the septum and the base of the inferior wall.  Findings Low risk nuclear study    ASSESSMENT:    1. Chest pain, unspecified type   2. Coronary artery disease involving coronary bypass graft of native heart with angina pectoris ([place])   3. Hyperlipidemia, unspecified hyperlipidemia type   4. Essential hypertension   5. Non-Hodgkin's lymphoma, unspecified body region, unspecified non-Hodgkin lymphoma type ([place])      PLAN:  In order of problems listed above:  1. Chest pain: Tend to occurs at rest, no exertional component.  I recommended Lexiscan Myoview for atypical chest  discomfort.  2. CAD s/p CABG  2007 with LIMA to LAD, SVG-D-OM2: Last Myoview was in 2017.  Continue aspirin, beta-blocker and high-dose statin.  3. Hypertension: Blood pressure stable.  Continue metoprolol.  4. Hyperlipidemia: On Zocor 40 mg daily, fasting lipid panel obtained in Dec 2018 showed cholesterol 134, triglyceride 102, HDL 61 and the  LDL 53.  Well controlled.  5. Non-Hodgkin's lymphoma: underwent chemotherapy in 2013.    Medication Adjustments/Labs and Tests Ordered: Current medicines are reviewed at length with the patient today.  Concerns regarding medicines are outlined above.  Medication changes, Labs and Tests ordered today are listed in the Patient Instructions below. Patient Instructions  Medication Instructions:  NO CHANGES-Your physician recommends that you continue on your current medications as directed. Please refer to the Current Medication list given to you today.  If you need a refill on your cardiac medications before your next appointment, please call your pharmacy.  Testing/Procedures: Your physician has requested that you have a lexiscan myoview.A cardiac stress test is a cardiological test that measures the heart's ability to respond to external stress in a controlled clinical environment. The stress response is induced byintravenous pharmacological stimulation. For further information please visit [facility]. Please follow instruction sheet, as given.   Special Instructions: WE WILL CALL WITH LEXISCAN-STRESS TEST RESULT  Follow-Up: Your physician wants you to follow-up in: WITH DR HILTY AS PLANNED IN DECEMBER You should receive a reminder letter in the mail two months in advance. If you do not receive a letter, please call our office 02-2018 to schedule the 04-2018 follow-up appointment.   Thank you for choosing [facility] at [facility], Almyra Deforest, [place]  06/18/2017 11:43 PM    [place] [address], [place], [place]  [address] Phone: [phone]; Fax: [phone]

## 2017-06-17 NOTE — Patient Instructions (Signed)
Medication Instructions:  NO CHANGES-Your physician recommends that you continue on your current medications as directed. Please refer to the Current Medication list given to you today.  If you need a refill on your cardiac medications before your next appointment, please call your pharmacy.  Testing/Procedures: Your physician has requested that you have a lexiscan myoview.A cardiac stress test is a cardiological test that measures the heart's ability to respond to external stress in a controlled clinical environment. The stress response is induced byintravenous pharmacological stimulation. For further information please visit [facility]. Please follow instruction sheet, as given.   Special Instructions: WE WILL CALL WITH LEXISCAN-STRESS TEST RESULT  Follow-Up: Your physician wants you to follow-up in: WITH DR HILTY AS PLANNED IN DECEMBER You should receive a reminder letter in the mail two months in advance. If you do not receive a letter, please call our office [phone] to schedule the 04-2018 follow-up appointment.   Thank you for choosing [facility] at [facility]!!

## 2017-06-18 ENCOUNTER — Encounter: Payer: Self-pay | Admitting: Physician Assistant

## 2017-06-24 ENCOUNTER — Telehealth (HOSPITAL_COMMUNITY): Payer: Self-pay

## 2017-06-24 NOTE — Telephone Encounter (Signed)
Encounter complete. 

## 2017-06-26 ENCOUNTER — Ambulatory Visit (HOSPITAL_COMMUNITY)
Admission: RE | Admit: 2017-06-26 | Discharge: 2017-06-26 | Disposition: A | Discharge status: Home / Self Care | Payer: Medicare Other | Source: Ambulatory Visit | Attending: Cardiology | Admitting: Cardiology

## 2017-06-26 DIAGNOSIS — I2510 Atherosclerotic heart disease of native coronary artery without angina pectoris: Secondary | ICD-10-CM | POA: Diagnosis not present

## 2017-06-26 DIAGNOSIS — R079 Chest pain, unspecified: Secondary | ICD-10-CM

## 2017-06-26 DIAGNOSIS — I10 Essential (primary) hypertension: Secondary | ICD-10-CM | POA: Diagnosis not present

## 2017-06-26 DIAGNOSIS — Z951 Presence of aortocoronary bypass graft: Secondary | ICD-10-CM | POA: Diagnosis not present

## 2017-06-26 DIAGNOSIS — Z87891 Personal history of nicotine dependence: Secondary | ICD-10-CM | POA: Insufficient documentation

## 2017-06-26 DIAGNOSIS — C8590 Non-Hodgkin lymphoma, unspecified, unspecified site: Secondary | ICD-10-CM | POA: Diagnosis not present

## 2017-06-26 DIAGNOSIS — Z8249 Family history of ischemic heart disease and other diseases of the circulatory system: Secondary | ICD-10-CM | POA: Diagnosis not present

## 2017-06-26 DIAGNOSIS — I252 Old myocardial infarction: Secondary | ICD-10-CM | POA: Insufficient documentation

## 2017-06-26 MED ORDER — TECHNETIUM TC 99M TETROFOSMIN IV KIT
10.5000 | PACK | Freq: Once | INTRAVENOUS | Status: AC | PRN
  Administered 2017-06-26: 10.5 via INTRAVENOUS
  Filled 2017-06-26: qty 11

## 2017-06-26 MED ORDER — TECHNETIUM TC 99M TETROFOSMIN IV KIT
30.8000 | PACK | Freq: Once | INTRAVENOUS | Status: AC | PRN
Start: 2017-06-26 — End: 2017-06-26
  Administered 2017-06-26: 30.8 via INTRAVENOUS
  Filled 2017-06-26: qty 31

## 2017-06-26 MED ORDER — REGADENOSON 0.4 MG/5ML IV SOLN
0.4000 mg | Freq: Once | INTRAVENOUS | Status: AC
  Administered 2017-06-26: 0.4 mg via INTRAVENOUS

## 2017-06-27 LAB — MYOCARDIAL PERFUSION IMAGING
CHL CUP NUCLEAR SSS: 9
LVDIAVOL: 107 mL (ref 62–150)
LVSYSVOL: 55 mL
Peak HR: 89 {beats}/min
Rest HR: 78 {beats}/min
SDS: 4
SRS: 5
TID: 1.13

## 2017-12-08 ENCOUNTER — Ambulatory Visit (INDEPENDENT_AMBULATORY_CARE_PROVIDER_SITE_OTHER): Payer: Medicare Other

## 2017-12-08 ENCOUNTER — Encounter: Payer: Self-pay | Admitting: Nurse Practitioner

## 2017-12-08 ENCOUNTER — Ambulatory Visit (INDEPENDENT_AMBULATORY_CARE_PROVIDER_SITE_OTHER): Payer: Medicare Other | Admitting: Nurse Practitioner

## 2017-12-08 VITALS — BP 125/72 | HR 86 | Temp 97.2°F | Ht 70.0 in | Wt 152.0 lb

## 2017-12-08 DIAGNOSIS — R634 Abnormal weight loss: Secondary | ICD-10-CM | POA: Diagnosis not present

## 2017-12-08 DIAGNOSIS — R0602 Shortness of breath: Secondary | ICD-10-CM | POA: Diagnosis not present

## 2017-12-08 DIAGNOSIS — R5383 Other fatigue: Secondary | ICD-10-CM

## 2017-12-08 DIAGNOSIS — R7309 Other abnormal glucose: Secondary | ICD-10-CM | POA: Diagnosis not present

## 2017-12-08 DIAGNOSIS — L609 Nail disorder, unspecified: Secondary | ICD-10-CM

## 2017-12-08 NOTE — Progress Notes (Signed)
   Subjective:    Patient ID: David Little, male    DOB: Feb 19, 1936, 82 y.o.   MRN: [id number]   Chief Complaint: ? spider bite to left ring finger (2 months ago   losing weight  would like xrays); Fatigue; and lower abdominal pain   HPI Patient comes in today with several complaints: - he has a black spot on his left ring finger nail. It been there for cooupleof months started at back of nail and has n=moved towards end of nail. He thinks it is a brown recluse spider bite. There is no drainage or edema. - he says he feels weak. He has been this way for over a month and he thinks it is getting worse. denies any recent tick bites. - weight loss over the last 2 months. 13lbs since February. Occasional sob. Slight nausea 2 weeks ago but that has resolved.   Review of Systems  Constitutional: Positive for activity change (decreased). Negative for chills and fever.  HENT: Negative.   Respiratory: Negative for cough, chest tightness and shortness of breath.   Cardiovascular: Negative for chest pain, palpitations and leg swelling.  Gastrointestinal: Negative for constipation, diarrhea, nausea and vomiting.  Genitourinary: Negative for dysuria, frequency and urgency.  Musculoskeletal: Negative.   Skin: Negative.   Neurological: Negative.   Psychiatric/Behavioral: Negative.        Objective:   Physical Exam  Constitutional: He is oriented to person, place, and time. He appears well-developed and well-nourished. No distress.  Cardiovascular: Normal rate.  Pulmonary/Chest: Effort normal.  Musculoskeletal: Normal range of motion.  Neurological: He is alert and oriented to person, place, and time.  Skin: Skin is warm and dry.  blacken nail bed left ring finger.      EKG- NSR-Mary-Margaret Hassell Done, FNP Chest x ray- unchanged from previous-Preliminary reading by Ronnald Collum, FNP  [facility]  BP 125/72   Pulse 86   Temp (!) 97.2 F (36.2 C) (Oral)   Ht '5\' 10"'$  (1.778 m)   Wt 152 lb  (68.9 kg)   BMI 21.81 kg/m       Assessment & Plan:  David Little in today with chief complaint of ? spider bite to left ring finger (2 months ago   losing weight  would like xrays); Fatigue; and lower abdominal pain   1. Fatigue, unspecified type - EKG 12-Lead - CBC with Differential/Platelet - Thyroid Panel With TSH  2. Weight loss Patient needs to follow up with oncology - DG Chest 2 View; Future - CMP14+EGFR - PSA, total and free  3. Nail lesion - Ambulatory referral to Dermatology  [place], [place]

## 2017-12-09 LAB — PSA, TOTAL AND FREE
PSA FREE PCT: 19.2 %
PSA, Free: 0.25 ng/mL
Prostate Specific Ag, Serum: 1.3 ng/mL (ref 0.0–4.0)

## 2017-12-09 LAB — THYROID PANEL WITH TSH
FREE THYROXINE INDEX: 1.7 (ref 1.2–4.9)
T3 UPTAKE RATIO: 28 % (ref 24–39)
T4, Total: 6.2 ug/dL (ref 4.5–12.0)
TSH: 2.83 u[IU]/mL (ref 0.450–4.500)

## 2017-12-09 LAB — CBC WITH DIFFERENTIAL/PLATELET
Basophils Absolute: 0.1 10*3/uL (ref 0.0–0.2)
Basos: 1 %
EOS (ABSOLUTE): 0.3 10*3/uL (ref 0.0–0.4)
EOS: 3 %
HEMOGLOBIN: 11.5 g/dL — AB (ref 13.0–17.7)
Hematocrit: 37.2 % — ABNORMAL LOW (ref 37.5–51.0)
IMMATURE GRANS (ABS): 0 10*3/uL (ref 0.0–0.1)
IMMATURE GRANULOCYTES: 0 %
LYMPHS: 25 %
Lymphocytes Absolute: 2.4 10*3/uL (ref 0.7–3.1)
MCH: 26.3 pg — AB (ref 26.6–33.0)
MCHC: 30.9 g/dL — ABNORMAL LOW (ref 31.5–35.7)
MCV: 85 fL (ref 79–97)
MONOCYTES: 9 %
Monocytes Absolute: 0.9 10*3/uL (ref 0.1–0.9)
NEUTROS ABS: 6.1 10*3/uL (ref 1.4–7.0)
NEUTROS PCT: 62 %
PLATELETS: 317 10*3/uL (ref 150–450)
RBC: 4.38 x10E6/uL (ref 4.14–5.80)
RDW: 13.3 % (ref 12.3–15.4)
WBC: 9.8 10*3/uL (ref 3.4–10.8)

## 2017-12-09 LAB — CMP14+EGFR
ALBUMIN: 3.8 g/dL (ref 3.5–4.8)
ALT: 12 IU/L (ref 0–44)
AST: 20 IU/L (ref 0–40)
Albumin/Globulin Ratio: 1.5 (ref 1.2–2.2)
Alkaline Phosphatase: 86 IU/L (ref 39–117)
BILIRUBIN TOTAL: 0.3 mg/dL (ref 0.0–1.2)
BUN / CREAT RATIO: 13 (ref 10–24)
BUN: 17 mg/dL (ref 8–27)
CALCIUM: 10.8 mg/dL — AB (ref 8.6–10.2)
CHLORIDE: 102 mmol/L (ref 96–106)
CO2: 24 mmol/L (ref 20–29)
Creatinine, Ser: 1.26 mg/dL (ref 0.76–1.27)
GFR, EST AFRICAN AMERICAN: 62 mL/min/{1.73_m2} (ref >59)
GFR, EST NON AFRICAN AMERICAN: 54 mL/min/{1.73_m2} — AB (ref >59)
Globulin, Total: 2.6 g/dL (ref 1.5–4.5)
Glucose: 128 mg/dL — ABNORMAL HIGH (ref 65–99)
Potassium: 3.9 mmol/L (ref 3.5–5.2)
Sodium: 143 mmol/L (ref 134–144)
TOTAL PROTEIN: 6.4 g/dL (ref 6.0–8.5)

## 2017-12-11 ENCOUNTER — Telehealth: Payer: Self-pay | Admitting: Oncology

## 2017-12-11 LAB — SPECIMEN STATUS REPORT

## 2017-12-11 LAB — HGB A1C W/O EAG: Hgb A1c MFr Bld: 5.8 % — ABNORMAL HIGH (ref 4.8–5.6)

## 2017-12-11 NOTE — Telephone Encounter (Signed)
Pt called in and left vmail to schedule appt to see Dr. Benay Spice - called pt back to r/s - no answer left message for patient to call back to set up an appt.

## 2017-12-15 ENCOUNTER — Telehealth: Payer: Self-pay

## 2017-12-15 ENCOUNTER — Telehealth: Payer: Self-pay | Admitting: Oncology

## 2017-12-15 NOTE — Telephone Encounter (Signed)
Spoke with pt regarding appt. Per Dr. Benay Spice, pt to be seen this Friday at 2:30 with Dr. Benay Spice, pt wife voiced understanding, will relay message. Schedule message sent

## 2017-12-15 NOTE — Telephone Encounter (Signed)
Scheduled appt per per 8/5 sch message - left message with appt date and time.

## 2017-12-19 ENCOUNTER — Inpatient Hospital Stay: Payer: Medicare Other | Attending: Oncology | Admitting: Oncology

## 2017-12-19 ENCOUNTER — Telehealth: Payer: Self-pay | Admitting: Oncology

## 2017-12-19 ENCOUNTER — Inpatient Hospital Stay: Payer: Medicare Other

## 2017-12-19 VITALS — BP 147/61

## 2017-12-19 VITALS — BP 122/56 | HR 81 | Temp 98.0°F | Resp 17 | Ht 70.0 in | Wt 152.4 lb

## 2017-12-19 DIAGNOSIS — Z5189 Encounter for other specified aftercare: Secondary | ICD-10-CM | POA: Diagnosis not present

## 2017-12-19 DIAGNOSIS — R109 Unspecified abdominal pain: Secondary | ICD-10-CM | POA: Diagnosis not present

## 2017-12-19 DIAGNOSIS — Z8572 Personal history of non-Hodgkin lymphomas: Secondary | ICD-10-CM | POA: Insufficient documentation

## 2017-12-19 DIAGNOSIS — N1330 Unspecified hydronephrosis: Secondary | ICD-10-CM | POA: Insufficient documentation

## 2017-12-19 DIAGNOSIS — D701 Agranulocytosis secondary to cancer chemotherapy: Secondary | ICD-10-CM | POA: Insufficient documentation

## 2017-12-19 DIAGNOSIS — Z5112 Encounter for antineoplastic immunotherapy: Secondary | ICD-10-CM | POA: Insufficient documentation

## 2017-12-19 DIAGNOSIS — C8330 Diffuse large B-cell lymphoma, unspecified site: Secondary | ICD-10-CM | POA: Diagnosis not present

## 2017-12-19 DIAGNOSIS — K5900 Constipation, unspecified: Secondary | ICD-10-CM | POA: Insufficient documentation

## 2017-12-19 DIAGNOSIS — C8590 Non-Hodgkin lymphoma, unspecified, unspecified site: Secondary | ICD-10-CM

## 2017-12-19 DIAGNOSIS — R1909 Other intra-abdominal and pelvic swelling, mass and lump: Secondary | ICD-10-CM | POA: Insufficient documentation

## 2017-12-19 DIAGNOSIS — R112 Nausea with vomiting, unspecified: Secondary | ICD-10-CM | POA: Diagnosis not present

## 2017-12-19 DIAGNOSIS — B370 Candidal stomatitis: Secondary | ICD-10-CM | POA: Diagnosis not present

## 2017-12-19 DIAGNOSIS — Z5111 Encounter for antineoplastic chemotherapy: Secondary | ICD-10-CM | POA: Diagnosis not present

## 2017-12-19 DIAGNOSIS — R627 Adult failure to thrive: Secondary | ICD-10-CM | POA: Insufficient documentation

## 2017-12-19 DIAGNOSIS — C8300 Small cell B-cell lymphoma, unspecified site: Secondary | ICD-10-CM | POA: Diagnosis present

## 2017-12-19 LAB — CBC WITH DIFFERENTIAL (CANCER CENTER ONLY)
Basophils Absolute: 0.1 10*3/uL (ref 0.0–0.1)
Basophils Relative: 1 %
Eosinophils Absolute: 0.4 10*3/uL (ref 0.0–0.5)
Eosinophils Relative: 4 %
HCT: 36.4 % — ABNORMAL LOW (ref 38.4–49.9)
HEMOGLOBIN: 11.7 g/dL — AB (ref 13.0–17.1)
LYMPHS ABS: 2.7 10*3/uL (ref 0.9–3.3)
LYMPHS PCT: 29 %
MCH: 27.4 pg (ref 27.2–33.4)
MCHC: 32.1 g/dL (ref 32.0–36.0)
MCV: 85.2 fL (ref 79.3–98.0)
MONOS PCT: 8 %
Monocytes Absolute: 0.8 10*3/uL (ref 0.1–0.9)
NEUTROS ABS: 5.3 10*3/uL (ref 1.5–6.5)
NEUTROS PCT: 58 %
Platelet Count: 233 10*3/uL (ref 140–400)
RBC: 4.27 MIL/uL (ref 4.20–5.82)
RDW: 13 % (ref 11.0–14.6)
WBC Count: 9.3 10*3/uL (ref 4.0–10.3)

## 2017-12-19 LAB — CMP (CANCER CENTER ONLY)
ALBUMIN: 3.5 g/dL (ref 3.5–5.0)
ALK PHOS: 93 U/L (ref 38–126)
ALT: 11 U/L (ref 0–44)
ANION GAP: 12 (ref 5–15)
AST: 24 U/L (ref 15–41)
BILIRUBIN TOTAL: 0.4 mg/dL (ref 0.3–1.2)
BUN: 21 mg/dL (ref 8–23)
CALCIUM: 11.7 mg/dL — AB (ref 8.9–10.3)
CO2: 26 mmol/L (ref 22–32)
Chloride: 106 mmol/L (ref 98–111)
Creatinine: 1.38 mg/dL — ABNORMAL HIGH (ref 0.61–1.24)
GFR, EST AFRICAN AMERICAN: 55 mL/min — AB (ref >60)
GFR, Estimated: 47 mL/min — ABNORMAL LOW (ref >60)
GLUCOSE: 81 mg/dL (ref 70–99)
Potassium: 4.2 mmol/L (ref 3.5–5.1)
Sodium: 144 mmol/L (ref 135–145)
TOTAL PROTEIN: 7.4 g/dL (ref 6.5–8.1)

## 2017-12-19 LAB — LACTATE DEHYDROGENASE: LDH: 203 U/L — ABNORMAL HIGH (ref 98–192)

## 2017-12-19 MED ORDER — SODIUM CHLORIDE 0.9 % IV SOLN
Freq: Once | INTRAVENOUS | Status: AC
  Administered 2017-12-19: 18:00:00 via INTRAVENOUS
  Filled 2017-12-19: qty 250

## 2017-12-19 MED ORDER — ZOLEDRONIC ACID 4 MG/100ML IV SOLN
4.0000 mg | Freq: Once | INTRAVENOUS | Status: AC
  Administered 2017-12-19: 4 mg via INTRAVENOUS
  Filled 2017-12-19: qty 100

## 2017-12-19 MED ORDER — SODIUM CHLORIDE 0.9 % IV SOLN
Freq: Once | INTRAVENOUS | Status: AC
  Administered 2017-12-19: 17:00:00 via INTRAVENOUS
  Filled 2017-12-19: qty 250

## 2017-12-19 NOTE — Progress Notes (Signed)
error 

## 2017-12-19 NOTE — Patient Instructions (Signed)

## 2017-12-19 NOTE — Progress Notes (Signed)
  [facility] OFFICE PROGRESS NOTE   Diagnosis: Non-Hodgkin's lymphoma  INTERVAL HISTORY:   David Little was last seen in the oncology clinic May 2017.  He reports a 1-47-month history of abdominal pain.  He has anorexia and reports a 20 pound weight loss over the past 6 months.  He has mild exertional dyspnea.  He has difficulty "walking ".  No focal neurologic symptoms.  He saw his primary physician 12/08/2017.  A chemistry panel was remarkable for a mildly elevated calcium at 10.8, and the hemoglobin returned at 11.5.  He is referred for oncology evaluation.   Review of systems: Positives- anorexia, weight loss, generalized abdominal pain, additional dyspnea A complete review of systems was otherwise negative Objective:  Vital signs in last 24 hours:  Blood pressure (!) 122/56, pulse 81, temperature 98 F (36.7 C), temperature source Oral, resp. rate 17, height 5\' 10"  (1.778 m), weight 152 lb 6.4 oz (69.1 kg), SpO2 100 %.    HEENT: Neck without mass Lymphatics: No cervical, supraclavicular, axillary, or inguinal nodes Resp: Lungs clear bilaterally Cardio: Rhythm GI: Nontender, no mass, no hepatosplenomegaly Vascular: No leg edema, the left lower leg is slightly larger than the right side Neuro: Alert and oriented, the motor exam appears intact in the upper and lower extremities bilaterally     Lab Results:  Lab Results  Component Value Date   WBC 9.3 12/19/2017   HGB 11.7 (L) 12/19/2017   HCT 36.4 (L) 12/19/2017   MCV 85.2 12/19/2017   PLT 233 12/19/2017   NEUTROABS 5.3 12/19/2017    CMP  Lab Results  Component Value Date   NA 144 12/19/2017   K 4.2 12/19/2017   CL 106 12/19/2017   CO2 26 12/19/2017   GLUCOSE 81 12/19/2017   BUN 21 12/19/2017   CREATININE 1.38 (H) 12/19/2017   CALCIUM 11.7 (H) 12/19/2017   PROT 7.4 12/19/2017   ALBUMIN 3.5 12/19/2017   AST 24 12/19/2017   ALT 11 12/19/2017   ALKPHOS 93 12/19/2017   BILITOT 0.4 12/19/2017   GFRNONAA 47 (L) 12/19/2017   GFRAA 55 (L) 12/19/2017     Medications: I have reviewed the patient's current medications.   Assessment/Plan: 1. Non-Hodgkin lymphoma - he completed fludarabine chemotherapy in February 2003. He remains in clinical remission.  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 History of coronary artery disease - status post coronary artery bypass surgery. 3. Pneumococcal vaccine in April 2014 4. Left knee pain and swelling-? Left knee arthritis 5.   Hypercalcemia 12/19/2017 6.   Elevated creatinine 12/19/2017  7.   Anorexia/weight loss/failure to thrive    Disposition: David Little a remote history of non-Hodgkin's lymphoma.  He was last seen at the Cancer center 2 years ago.  He presents with abdominal pain, failure to thrive, and hypercalcemia.  I am concerned he has developed recurrence of the lymphoma or another malignancy.  David Little will receive intravenous fluids and biphosphonate therapy today.  We reviewed the potential toxicities associated with Zometa and he agrees to proceed.  He will undergo restaging CTs and return for an office visit 12/24/2017.  45 minutes were spent with the patient today.  The majority of the time was used for counseling and coordination of care.  Betsy Coder, MD  12/19/2017  5:06 PM

## 2017-12-19 NOTE — Telephone Encounter (Signed)
Appointments scheduled AVS/Calendar printed per 8/9 los °

## 2017-12-19 NOTE — Progress Notes (Signed)
1755- R AC IV infiltration during zometa infusion. IV d'c'd and wrapped with coband. Cold compress applied for 39minutes per Mendel Ryder, NP. Dr. Benay Spice made aware. Pt knows to alert [place] if he notices any further swelling or signs of infection.

## 2017-12-22 ENCOUNTER — Ambulatory Visit: Payer: Self-pay | Admitting: Oncology

## 2017-12-22 DIAGNOSIS — B351 Tinea unguium: Secondary | ICD-10-CM | POA: Diagnosis not present

## 2017-12-24 ENCOUNTER — Other Ambulatory Visit: Payer: Self-pay

## 2017-12-24 ENCOUNTER — Inpatient Hospital Stay: Payer: Medicare Other

## 2017-12-24 ENCOUNTER — Telehealth: Payer: Self-pay | Admitting: Oncology

## 2017-12-24 ENCOUNTER — Ambulatory Visit: Payer: Self-pay | Admitting: Oncology

## 2017-12-24 ENCOUNTER — Ambulatory Visit (HOSPITAL_COMMUNITY)
Admission: RE | Admit: 2017-12-24 | Discharge: 2017-12-24 | Disposition: A | Discharge status: Home / Self Care | Payer: Medicare Other | Source: Ambulatory Visit | Attending: Oncology | Admitting: Oncology

## 2017-12-24 ENCOUNTER — Encounter (HOSPITAL_COMMUNITY): Payer: Self-pay | Admitting: Radiology

## 2017-12-24 DIAGNOSIS — B370 Candidal stomatitis: Secondary | ICD-10-CM | POA: Diagnosis not present

## 2017-12-24 DIAGNOSIS — Z5112 Encounter for antineoplastic immunotherapy: Secondary | ICD-10-CM | POA: Diagnosis not present

## 2017-12-24 DIAGNOSIS — N400 Enlarged prostate without lower urinary tract symptoms: Secondary | ICD-10-CM | POA: Diagnosis not present

## 2017-12-24 DIAGNOSIS — R109 Unspecified abdominal pain: Secondary | ICD-10-CM | POA: Diagnosis not present

## 2017-12-24 DIAGNOSIS — C8590 Non-Hodgkin lymphoma, unspecified, unspecified site: Secondary | ICD-10-CM

## 2017-12-24 DIAGNOSIS — R1909 Other intra-abdominal and pelvic swelling, mass and lump: Secondary | ICD-10-CM | POA: Insufficient documentation

## 2017-12-24 DIAGNOSIS — K5900 Constipation, unspecified: Secondary | ICD-10-CM | POA: Diagnosis not present

## 2017-12-24 DIAGNOSIS — N1330 Unspecified hydronephrosis: Secondary | ICD-10-CM | POA: Diagnosis not present

## 2017-12-24 DIAGNOSIS — R112 Nausea with vomiting, unspecified: Secondary | ICD-10-CM | POA: Diagnosis not present

## 2017-12-24 DIAGNOSIS — N200 Calculus of kidney: Secondary | ICD-10-CM | POA: Insufficient documentation

## 2017-12-24 DIAGNOSIS — I700 Atherosclerosis of aorta: Secondary | ICD-10-CM | POA: Diagnosis not present

## 2017-12-24 DIAGNOSIS — Z5111 Encounter for antineoplastic chemotherapy: Secondary | ICD-10-CM | POA: Diagnosis not present

## 2017-12-24 DIAGNOSIS — C8330 Diffuse large B-cell lymphoma, unspecified site: Secondary | ICD-10-CM | POA: Diagnosis not present

## 2017-12-24 DIAGNOSIS — R627 Adult failure to thrive: Secondary | ICD-10-CM | POA: Diagnosis not present

## 2017-12-24 DIAGNOSIS — N320 Bladder-neck obstruction: Secondary | ICD-10-CM | POA: Insufficient documentation

## 2017-12-24 DIAGNOSIS — R911 Solitary pulmonary nodule: Secondary | ICD-10-CM | POA: Insufficient documentation

## 2017-12-24 DIAGNOSIS — D701 Agranulocytosis secondary to cancer chemotherapy: Secondary | ICD-10-CM | POA: Diagnosis not present

## 2017-12-24 DIAGNOSIS — Z8572 Personal history of non-Hodgkin lymphomas: Secondary | ICD-10-CM | POA: Diagnosis not present

## 2017-12-24 DIAGNOSIS — Z5189 Encounter for other specified aftercare: Secondary | ICD-10-CM | POA: Diagnosis not present

## 2017-12-24 LAB — CMP (CANCER CENTER ONLY)
ALBUMIN: 3.2 g/dL — AB (ref 3.5–5.0)
ALK PHOS: 85 U/L (ref 38–126)
ALT: 12 U/L (ref 0–44)
AST: 24 U/L (ref 15–41)
Anion gap: 13 (ref 5–15)
BILIRUBIN TOTAL: 0.5 mg/dL (ref 0.3–1.2)
BUN: 18 mg/dL (ref 8–23)
CALCIUM: 10.5 mg/dL — AB (ref 8.9–10.3)
CO2: 24 mmol/L (ref 22–32)
CREATININE: 1.28 mg/dL — AB (ref 0.61–1.24)
Chloride: 104 mmol/L (ref 98–111)
GFR, Est AFR Am: 60 mL/min — ABNORMAL LOW (ref >60)
GFR, Estimated: 52 mL/min — ABNORMAL LOW (ref >60)
GLUCOSE: 81 mg/dL (ref 70–99)
Potassium: 3.7 mmol/L (ref 3.5–5.1)
Sodium: 141 mmol/L (ref 135–145)
TOTAL PROTEIN: 6.7 g/dL (ref 6.5–8.1)

## 2017-12-24 MED ORDER — IOHEXOL 300 MG/ML  SOLN
100.0000 mL | Freq: Once | INTRAMUSCULAR | Status: AC | PRN
  Administered 2017-12-24: 100 mL via INTRAVENOUS

## 2017-12-24 MED ORDER — IOPAMIDOL (ISOVUE-300) INJECTION 61%
30.0000 mL | Freq: Once | INTRAVENOUS | Status: AC | PRN
  Administered 2017-12-24: 30 mL via ORAL

## 2017-12-24 MED ORDER — IOPAMIDOL (ISOVUE-300) INJECTION 61%
INTRAVENOUS | Status: AC
  Filled 2017-12-24: qty 30

## 2017-12-24 NOTE — Telephone Encounter (Signed)
Pt aware of r/s per  8/14 sch message

## 2017-12-24 NOTE — Telephone Encounter (Signed)
Appointment moved due to CT Scan not being scheduled until 8/14 ok to move F/U appt per GBS.  Patient notified

## 2017-12-25 ENCOUNTER — Inpatient Hospital Stay (HOSPITAL_BASED_OUTPATIENT_CLINIC_OR_DEPARTMENT_OTHER): Payer: Medicare Other | Admitting: Oncology

## 2017-12-25 ENCOUNTER — Encounter (HOSPITAL_COMMUNITY): Payer: Self-pay

## 2017-12-25 ENCOUNTER — Other Ambulatory Visit: Payer: Self-pay | Admitting: Oncology

## 2017-12-25 ENCOUNTER — Ambulatory Visit (HOSPITAL_COMMUNITY)
Admission: RE | Admit: 2017-12-25 | Discharge: 2017-12-25 | Disposition: A | Discharge status: Home / Self Care | Payer: Medicare Other | Source: Ambulatory Visit | Attending: Oncology | Admitting: Oncology

## 2017-12-25 ENCOUNTER — Other Ambulatory Visit: Payer: Self-pay

## 2017-12-25 ENCOUNTER — Ambulatory Visit (HOSPITAL_COMMUNITY)
Admission: RE | Admit: 2017-12-25 | Discharge: 2017-12-25 | Disposition: A | Discharge status: Home / Self Care | Payer: Medicare Other | Source: Ambulatory Visit | Attending: Interventional Radiology | Admitting: Interventional Radiology

## 2017-12-25 ENCOUNTER — Other Ambulatory Visit: Payer: Self-pay | Admitting: Student

## 2017-12-25 ENCOUNTER — Ambulatory Visit: Payer: Medicare Other | Admitting: Cardiology

## 2017-12-25 VITALS — BP 132/64 | HR 92 | Temp 98.0°F | Resp 20 | Ht 70.0 in | Wt 152.0 lb

## 2017-12-25 DIAGNOSIS — R109 Unspecified abdominal pain: Secondary | ICD-10-CM | POA: Diagnosis not present

## 2017-12-25 DIAGNOSIS — D490 Neoplasm of unspecified behavior of digestive system: Secondary | ICD-10-CM

## 2017-12-25 DIAGNOSIS — N3289 Other specified disorders of bladder: Secondary | ICD-10-CM | POA: Diagnosis not present

## 2017-12-25 DIAGNOSIS — E7800 Pure hypercholesterolemia, unspecified: Secondary | ICD-10-CM | POA: Insufficient documentation

## 2017-12-25 DIAGNOSIS — C8200 Follicular lymphoma grade I, unspecified site: Secondary | ICD-10-CM

## 2017-12-25 DIAGNOSIS — N1330 Unspecified hydronephrosis: Secondary | ICD-10-CM

## 2017-12-25 DIAGNOSIS — Z85828 Personal history of other malignant neoplasm of skin: Secondary | ICD-10-CM | POA: Insufficient documentation

## 2017-12-25 DIAGNOSIS — N132 Hydronephrosis with renal and ureteral calculous obstruction: Secondary | ICD-10-CM | POA: Diagnosis not present

## 2017-12-25 DIAGNOSIS — Z951 Presence of aortocoronary bypass graft: Secondary | ICD-10-CM | POA: Insufficient documentation

## 2017-12-25 DIAGNOSIS — I700 Atherosclerosis of aorta: Secondary | ICD-10-CM | POA: Insufficient documentation

## 2017-12-25 DIAGNOSIS — B370 Candidal stomatitis: Secondary | ICD-10-CM | POA: Diagnosis not present

## 2017-12-25 DIAGNOSIS — Z5189 Encounter for other specified aftercare: Secondary | ICD-10-CM | POA: Diagnosis not present

## 2017-12-25 DIAGNOSIS — N320 Bladder-neck obstruction: Secondary | ICD-10-CM | POA: Diagnosis not present

## 2017-12-25 DIAGNOSIS — I252 Old myocardial infarction: Secondary | ICD-10-CM | POA: Insufficient documentation

## 2017-12-25 DIAGNOSIS — Z8572 Personal history of non-Hodgkin lymphomas: Secondary | ICD-10-CM

## 2017-12-25 DIAGNOSIS — R590 Localized enlarged lymph nodes: Secondary | ICD-10-CM | POA: Diagnosis not present

## 2017-12-25 DIAGNOSIS — C8513 Unspecified B-cell lymphoma, intra-abdominal lymph nodes: Secondary | ICD-10-CM | POA: Insufficient documentation

## 2017-12-25 DIAGNOSIS — C8590 Non-Hodgkin lymphoma, unspecified, unspecified site: Secondary | ICD-10-CM | POA: Diagnosis not present

## 2017-12-25 DIAGNOSIS — N400 Enlarged prostate without lower urinary tract symptoms: Secondary | ICD-10-CM | POA: Diagnosis not present

## 2017-12-25 DIAGNOSIS — R1909 Other intra-abdominal and pelvic swelling, mass and lump: Secondary | ICD-10-CM | POA: Insufficient documentation

## 2017-12-25 DIAGNOSIS — D701 Agranulocytosis secondary to cancer chemotherapy: Secondary | ICD-10-CM | POA: Diagnosis not present

## 2017-12-25 DIAGNOSIS — Z79899 Other long term (current) drug therapy: Secondary | ICD-10-CM | POA: Diagnosis not present

## 2017-12-25 DIAGNOSIS — Z87891 Personal history of nicotine dependence: Secondary | ICD-10-CM | POA: Diagnosis not present

## 2017-12-25 DIAGNOSIS — Z5112 Encounter for antineoplastic immunotherapy: Secondary | ICD-10-CM | POA: Diagnosis not present

## 2017-12-25 DIAGNOSIS — R627 Adult failure to thrive: Secondary | ICD-10-CM

## 2017-12-25 DIAGNOSIS — R221 Localized swelling, mass and lump, neck: Secondary | ICD-10-CM | POA: Diagnosis not present

## 2017-12-25 DIAGNOSIS — I2510 Atherosclerotic heart disease of native coronary artery without angina pectoris: Secondary | ICD-10-CM | POA: Insufficient documentation

## 2017-12-25 DIAGNOSIS — R112 Nausea with vomiting, unspecified: Secondary | ICD-10-CM | POA: Diagnosis not present

## 2017-12-25 DIAGNOSIS — K5900 Constipation, unspecified: Secondary | ICD-10-CM | POA: Diagnosis not present

## 2017-12-25 DIAGNOSIS — E785 Hyperlipidemia, unspecified: Secondary | ICD-10-CM | POA: Diagnosis not present

## 2017-12-25 DIAGNOSIS — Z5111 Encounter for antineoplastic chemotherapy: Secondary | ICD-10-CM | POA: Diagnosis not present

## 2017-12-25 DIAGNOSIS — Z7982 Long term (current) use of aspirin: Secondary | ICD-10-CM | POA: Insufficient documentation

## 2017-12-25 DIAGNOSIS — C8330 Diffuse large B-cell lymphoma, unspecified site: Secondary | ICD-10-CM | POA: Diagnosis not present

## 2017-12-25 LAB — CBC WITH DIFFERENTIAL/PLATELET
Basophils Absolute: 0 10*3/uL (ref 0.0–0.1)
Basophils Relative: 0 %
Eosinophils Absolute: 0.3 10*3/uL (ref 0.0–0.7)
Eosinophils Relative: 3 %
HEMATOCRIT: 36.5 % — AB (ref 39.0–52.0)
Hemoglobin: 11.8 g/dL — ABNORMAL LOW (ref 13.0–17.0)
LYMPHS PCT: 23 %
Lymphs Abs: 2.3 10*3/uL (ref 0.7–4.0)
MCH: 27.6 pg (ref 26.0–34.0)
MCHC: 32.3 g/dL (ref 30.0–36.0)
MCV: 85.3 fL (ref 78.0–100.0)
MONO ABS: 0.7 10*3/uL (ref 0.1–1.0)
MONOS PCT: 8 %
NEUTROS ABS: 6.5 10*3/uL (ref 1.7–7.7)
Neutrophils Relative %: 66 %
Platelets: 245 10*3/uL (ref 150–400)
RBC: 4.28 MIL/uL (ref 4.22–5.81)
RDW: 13.5 % (ref 11.5–15.5)
WBC: 9.9 10*3/uL (ref 4.0–10.5)

## 2017-12-25 LAB — BASIC METABOLIC PANEL
ANION GAP: 11 (ref 5–15)
BUN: 15 mg/dL (ref 8–23)
CO2: 26 mmol/L (ref 22–32)
Calcium: 10 mg/dL (ref 8.9–10.3)
Chloride: 107 mmol/L (ref 98–111)
Creatinine, Ser: 1.43 mg/dL — ABNORMAL HIGH (ref 0.61–1.24)
GFR, EST AFRICAN AMERICAN: 52 mL/min — AB (ref >60)
GFR, EST NON AFRICAN AMERICAN: 45 mL/min — AB (ref >60)
GLUCOSE: 103 mg/dL — AB (ref 70–99)
POTASSIUM: 3.6 mmol/L (ref 3.5–5.1)
Sodium: 144 mmol/L (ref 135–145)

## 2017-12-25 LAB — PROTIME-INR
INR: 1.12
Prothrombin Time: 14.3 seconds (ref 11.4–15.2)

## 2017-12-25 LAB — URIC ACID: Uric Acid, Serum: 7.2 mg/dL (ref 3.7–8.6)

## 2017-12-25 LAB — APTT: aPTT: 35 seconds (ref 24–36)

## 2017-12-25 MED ORDER — FENTANYL CITRATE (PF) 100 MCG/2ML IJ SOLN
INTRAMUSCULAR | Status: AC
  Filled 2017-12-25: qty 4

## 2017-12-25 MED ORDER — MIDAZOLAM HCL 2 MG/2ML IJ SOLN
INTRAMUSCULAR | Status: AC | PRN
  Administered 2017-12-25: 1 mg via INTRAVENOUS

## 2017-12-25 MED ORDER — MIDAZOLAM HCL 2 MG/2ML IJ SOLN
INTRAMUSCULAR | Status: AC
  Filled 2017-12-25: qty 4

## 2017-12-25 MED ORDER — FLUMAZENIL 0.5 MG/5ML IV SOLN
INTRAVENOUS | Status: AC
  Filled 2017-12-25: qty 5

## 2017-12-25 MED ORDER — NALOXONE HCL 0.4 MG/ML IJ SOLN
INTRAMUSCULAR | Status: AC
  Filled 2017-12-25: qty 1

## 2017-12-25 MED ORDER — FENTANYL CITRATE (PF) 100 MCG/2ML IJ SOLN
INTRAMUSCULAR | Status: AC | PRN
  Administered 2017-12-25: 50 ug via INTRAVENOUS

## 2017-12-25 MED ORDER — SODIUM CHLORIDE 0.9 % IV SOLN
INTRAVENOUS | Status: DC
  Administered 2017-12-25: 11:00:00 via INTRAVENOUS

## 2017-12-25 MED ORDER — LIDOCAINE-EPINEPHRINE (PF) 2 %-1:200000 IJ SOLN
INTRAMUSCULAR | Status: AC | PRN
  Administered 2017-12-25: 10 mL

## 2017-12-25 NOTE — Procedures (Signed)
Pre procedural Dx: Retroperitoneal lymphadenopathy  Post procedural Dx: Same  Technically successful CT guided biopsy of dominant nodal conglomeration within the left side of the retroperitoneum.   EBL: None.   Complications: None immediate.   Ronny Bacon, MD Pager #: [phone]

## 2017-12-25 NOTE — Progress Notes (Signed)
[facility] OFF[facility] PROGRESS NOTE   Diagnosis: Non-Hodgkin's lymphoma  INTERVAL HISTORY:   Mr. Robling returns for a scheduled visit.  CTs yesterday confirmed a large retroperitoneal mass, likely accounting for his symptoms.  He underwent a biopsy of the mass earlier today.  He was treated for hypercalcemia on 12/19/2017.  He reports feeling better after the Zometa.  Objective:  Vital signs in last 24 hours:  Blood pressure 132/64, pulse 92, temperature 98 F (36.7 C), temperature source Oral, resp. rate 20, height 5\' 10"  (1.778 m), weight 152 lb (68.9 kg), SpO2 99 %.    Resp: Lungs clear bilaterally Cardio: Regular rate and rhythm GI: No mass, nontender, no hepatosplenomegaly Vascular: No leg edema, left lower leg is larger than the right side  Skin: Left lower back biopsy site with a gauze dressing  Lab Results:  Lab Results  Component Value Date   WBC 9.9 12/25/2017   HGB 11.8 (L) 12/25/2017   HCT 36.5 (L) 12/25/2017   MCV 85.3 12/25/2017   PLT 245 12/25/2017   NEUTROABS 6.5 12/25/2017    CMP  Lab Results  Component Value Date   NA 144 12/25/2017   K 3.6 12/25/2017   CL 107 12/25/2017   CO2 26 12/25/2017   GLUCOSE 103 (H) 12/25/2017   BUN 15 12/25/2017   CREATININE 1.43 (H) 12/25/2017   CALCIUM 10.0 12/25/2017   PROT 6.7 12/24/2017   ALBUMIN 3.2 (L) 12/24/2017   AST 24 12/24/2017   ALT 12 12/24/2017   ALKPHOS 85 12/24/2017   BILITOT 0.5 12/24/2017   GFRNONAA 45 (L) 12/25/2017   GFRAA 52 (L) 12/25/2017     Imaging:  Ct Chest W Contrast  Result Date: 12/24/2017 CLINICAL DATA:  Remote history of non-Hodgkin's lymphoma. Now with 15 pound weight loss. EXAM: CT CHEST, ABDOMEN, AND PELVIS WITH CONTRAST TECHNIQUE: Multidetector CT imaging of the chest, abdomen and pelvis was performed following the standard protocol during bolus administration of intravenous contrast. CONTRAST:  60mL ISOVUE-300 IOPAMIDOL (ISOVUE-300) INJECTION 61%, 194mL  OMNIPAQUE IOHEXOL 300 MG/ML SOLN COMPARISON:  None FINDINGS: CT CHEST FINDINGS Cardiovascular: The patient is status post median sternotomy and CABG procedure. No pericardial effusion identified. Aortic atherosclerosis noted. Mediastinum/Nodes: Normal appearance of the thyroid gland. The trachea appears patent and is midline. Normal appearance of the esophagus. No enlarged axillary, supraclavicular, mediastinal or hilar lymph nodes. Lungs/Pleura: Small left pleural effusion. No airspace consolidation, atelectasis or pneumothorax. Mild peripheral interstitial reticulation is identified. Small round solid nodule in the left posterior lung base measures 6 mm, image 133/4. Musculoskeletal: No chest wall mass or suspicious bone lesions identified. CT ABDOMEN PELVIS FINDINGS Hepatobiliary: Within the left lobe of liver there is an indeterminate intermediate attenuating structure measuring 1.4 cm, image 53/2. The gallbladder is unremarkable. No biliary dilatation. Pancreas: Beginning at the level of the pancreatic neck and extending through the tail of pancreas is diffuse parenchymal volume loss with dilatation of the main duct. Main duct dilatation measures up to 8 mm. Large retroperitoneal mass is present which results in anterior displacement of the pancreas. Invasion of the pancreatic parenchyma may be present which would explain the main duct dilatation. Spleen: Normal size of the spleen.  No focal abnormality. Adrenals/Urinary Tract: Both adrenal glands are normal. Nonobstructing calculus within the upper pole of right kidney measures 2 mm, image 62/2. The left kidney is hydronephrotic. There is marked dilatation of the left renal pelvis, image 76/2. Retroperitoneal tumor encases and narrows the left renal artery  and vein. Retroperitoneal tumor extension into the left upper pole renal sinus is identified, image 87/5. Hyperdense, solid-appearing nodule within the medial cortex of the upper pole of right kidney  measures 1.1 cm in may reflect direct extension from retroperitoneal tumor. Bladder wall trabeculation is identified. Right posterior bladder wall diverticula measures 2.6 cm. Stomach/Bowel: There is no abnormal distension of the gastric lumen. No pathologically enlarged small bowel loops. Dilated low-attenuation tubular structure within the right lower quadrant of the abdomen which appears contiguous with the cecal base measures 3 cm in diameter and may represent a large appendiceal mucocele versus chronic appendicitis. No surrounding inflammatory changes identified. No pathologic dilatation of the colon. Extensive colonic diverticulosis identified. Vascular/Lymphatic: Aortic atherosclerosis without aneurysm. There is a large retroperitoneal soft tissue mass which completely encases the abdominal aorta, celiac artery, SMA, IMA and bilateral renal arteries. The mass measures 9.3 by 7.4 by 12.2 cm, image 65/2 and image 64/5. Encasement and narrowing of the portal venous confluence is noted. The main portal vein and intrahepatic portal veins appear patent. Retroperitoneal tumor appears to encase the left ureter and left gonadal vein and extends along the structures into the upper pelvis. No pelvic or inguinal adenopathy identified. Reproductive: Prostate gland appears mildly enlarged and nodular with mass effect upon the bladder base. Other: No ascites or focal fluid collections. Musculoskeletal: The bones appear osteopenic. No aggressive lytic or sclerotic bone lesions identified. IMPRESSION: 1. There is a large retroperitoneal mass within the abdomen compatible with recurrent lymphoma. Tumor encases the branch vessels of the abdominal aorta as well as the left ureter and left gonadal vein. There is also encasement and narrowing of the portal venous confluence. The mass also involves the pancreas at the level of the neck resulting in atrophy of the pancreatic body and tail with dilatation of the main duct. Left  lateral extension of the tumor into the left upper pole renal sinus and possibly upper pole cortex of the left kidney noted. Suggest further evaluation with PET-CT. 2. There is an indeterminate low-attenuation structure identified within the lateral segment of left lobe of liver. This could be better assessed with PET-CT or contrast enhanced liver protocol MRI. 3. There is a low-attenuation tubular structure within the right lower quadrant of the abdomen which is concerning for appendiceal mucocele versus chronic appendicitis. Assuming no symptoms of appendicitis, further investigation with either surgical resection or colonoscopy is advised. 4. Indeterminate nodule in the left lower lobe measures 6 mm. Nonspecific. 5.  Aortic Atherosclerosis (ICD10-I70.0). 6. Right kidney stones 7. Mild prostate gland enlargement with evidence of chronic bladder outlet obstruction including bladder wall trabeculation and diverticula formation. Electronically Signed   By: Kerby Moors M.D.   On: 12/24/2017 17:22   Ct Abdomen Pelvis W Contrast  Result Date: 12/24/2017 CLINICAL DATA:  Remote history of non-Hodgkin's lymphoma. Now with 15 pound weight loss. EXAM: CT CHEST, ABDOMEN, AND PELVIS WITH CONTRAST TECHNIQUE: Multidetector CT imaging of the chest, abdomen and pelvis was performed following the standard protocol during bolus administration of intravenous contrast. CONTRAST:  15mL ISOVUE-300 IOPAMIDOL (ISOVUE-300) INJECTION 61%, 19mL OMNIPAQUE IOHEXOL 300 MG/ML SOLN COMPARISON:  None FINDINGS: CT CHEST FINDINGS Cardiovascular: The patient is status post median sternotomy and CABG procedure. No pericardial effusion identified. Aortic atherosclerosis noted. Mediastinum/Nodes: Normal appearance of the thyroid gland. The trachea appears patent and is midline. Normal appearance of the esophagus. No enlarged axillary, supraclavicular, mediastinal or hilar lymph nodes. Lungs/Pleura: Small left pleural effusion. No airspace  consolidation, atelectasis or  pneumothorax. Mild peripheral interstitial reticulation is identified. Small round solid nodule in the left posterior lung base measures 6 mm, image 133/4. Musculoskeletal: No chest wall mass or suspicious bone lesions identified. CT ABDOMEN PELVIS FINDINGS Hepatobiliary: Within the left lobe of liver there is an indeterminate intermediate attenuating structure measuring 1.4 cm, image 53/2. The gallbladder is unremarkable. No biliary dilatation. Pancreas: Beginning at the level of the pancreatic neck and extending through the tail of pancreas is diffuse parenchymal volume loss with dilatation of the main duct. Main duct dilatation measures up to 8 mm. Large retroperitoneal mass is present which results in anterior displacement of the pancreas. Invasion of the pancreatic parenchyma may be present which would explain the main duct dilatation. Spleen: Normal size of the spleen.  No focal abnormality. Adrenals/Urinary Tract: Both adrenal glands are normal. Nonobstructing calculus within the upper pole of right kidney measures 2 mm, image 62/2. The left kidney is hydronephrotic. There is marked dilatation of the left renal pelvis, image 76/2. Retroperitoneal tumor encases and narrows the left renal artery and vein. Retroperitoneal tumor extension into the left upper pole renal sinus is identified, image 87/5. Hyperdense, solid-appearing nodule within the medial cortex of the upper pole of right kidney measures 1.1 cm in may reflect direct extension from retroperitoneal tumor. Bladder wall trabeculation is identified. Right posterior bladder wall diverticula measures 2.6 cm. Stomach/Bowel: There is no abnormal distension of the gastric lumen. No pathologically enlarged small bowel loops. Dilated low-attenuation tubular structure within the right lower quadrant of the abdomen which appears contiguous with the cecal base measures 3 cm in diameter and may represent a large appendiceal mucocele  versus chronic appendicitis. No surrounding inflammatory changes identified. No pathologic dilatation of the colon. Extensive colonic diverticulosis identified. Vascular/Lymphatic: Aortic atherosclerosis without aneurysm. There is a large retroperitoneal soft tissue mass which completely encases the abdominal aorta, celiac artery, SMA, IMA and bilateral renal arteries. The mass measures 9.3 by 7.4 by 12.2 cm, image 65/2 and image 64/5. Encasement and narrowing of the portal venous confluence is noted. The main portal vein and intrahepatic portal veins appear patent. Retroperitoneal tumor appears to encase the left ureter and left gonadal vein and extends along the structures into the upper pelvis. No pelvic or inguinal adenopathy identified. Reproductive: Prostate gland appears mildly enlarged and nodular with mass effect upon the bladder base. Other: No ascites or focal fluid collections. Musculoskeletal: The bones appear osteopenic. No aggressive lytic or sclerotic bone lesions identified. IMPRESSION: 1. There is a large retroperitoneal mass within the abdomen compatible with recurrent lymphoma. Tumor encases the branch vessels of the abdominal aorta as well as the left ureter and left gonadal vein. There is also encasement and narrowing of the portal venous confluence. The mass also involves the pancreas at the level of the neck resulting in atrophy of the pancreatic body and tail with dilatation of the main duct. Left lateral extension of the tumor into the left upper pole renal sinus and possibly upper pole cortex of the left kidney noted. Suggest further evaluation with PET-CT. 2. There is an indeterminate low-attenuation structure identified within the lateral segment of left lobe of liver. This could be better assessed with PET-CT or contrast enhanced liver protocol MRI. 3. There is a low-attenuation tubular structure within the right lower quadrant of the abdomen which is concerning for appendiceal mucocele  versus chronic appendicitis. Assuming no symptoms of appendicitis, further investigation with either surgical resection or colonoscopy is advised. 4. Indeterminate nodule in the left  lower lobe measures 6 mm. Nonspecific. 5.  Aortic Atherosclerosis (ICD10-I70.0). 6. Right kidney stones 7. Mild prostate gland enlargement with evidence of chronic bladder outlet obstruction including bladder wall trabeculation and diverticula formation. Electronically Signed   By: Kerby Moors M.D.   On: 12/24/2017 17:22   Ct Biopsy  Result Date: 12/25/2017 INDICATION: Remote history of lymphoma, now with retroperitoneal lymphadenopathy worrisome for disease recurrence. Please perform CT-guided biopsy for tissue diagnostic purposes. EXAM: CT-GUIDED BIOPSY OF RETROPERITONEAL NODAL CONGLOMERATION COMPARISON:  CT abdomen and pelvis - 12/24/2017 MEDICATIONS: None. ANESTHESIA/SEDATION: Fentanyl 50 mcg IV; Versed 1 mg IV Sedation time: 11 minutes; The patient was continuously monitored during the procedure by the interventional radiology nurse under my direct supervision. CONTRAST:  None. COMPLICATIONS: None immediate. PROCEDURE: Informed consent was obtained from the patient following an explanation of the procedure, risks, benefits and alternatives. A time out was performed prior to the initiation of the procedure. The patient was positioned prone on the CT table and a limited CT was performed for procedural planning demonstrating unchanged size and appearance of the confluent retroperitoneal nodal mass. The procedure was planned. The operative site was prepped and draped in the usual sterile fashion. Appropriate trajectory was confirmed with a 22 gauge spinal needle after the adjacent tissues were anesthetized with 1% Lidocaine with epinephrine. Under intermittent CT guidance, a 17 gauge coaxial needle was advanced into the caudal posterior aspect of the retroperitoneal nodal conglomeration with special attention paid to avoid the  expected location of the left-sided retroaortic renal vein. Appropriate positioning was confirmed and 6 core needle biopsy samples were obtained with an 18 gauge core needle biopsy device. The co-axial needle was removed and hemostasis was achieved with manual compression. A limited postprocedural CT was negative for hemorrhage or additional complication. A dressing was placed. The patient tolerated the procedure well without immediate postprocedural complication. IMPRESSION: Technically successful CT guided core needle biopsy of retroperitoneal nodal conglomeration. Electronically Signed   By: Sandi Mariscal M.D.   On: 12/25/2017 13:48    Medications: I have reviewed the patient's current medications.   Assessment/Plan: 1. Non-Hodgkin lymphoma - he completed fludarabine chemotherapy in February 2003. He remains in clinical remission.  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  Indeterminate lesion in the left liver, tubular structure in the right lower quadrant concerning for mucocele, indeterminate left lower lobe nodule  2. History of coronary artery disease - status post coronary artery bypass surgery. 3. Pneumococcal vaccine in April 2014 4. Left knee pain and swelling-? Left knee arthritis 5.   Hypercalcemia 12/19/2017, status post Zometa 12/19/2017-improved 6.   Elevated creatinine secondary to obstructing left peritoneal mass  CT 12/24/2017- left hydronephrosis secondary to obstructing retroperitoneal mass 7.   Anorexia/weight loss/failure to thrive     Disposition: Mr. Portales has a remote history of non-Hodgkin's lymphoma, treated with fludarabine/rituximab in 2002-2003. He now presents with failure to thrive and  hypercalcemia.  A CT yesterday reveals a large retroperitoneal mass.  He most likely has recurrence of large cell lymphoma.  He underwent a biopsy earlier today.  The pathology is pending.  I will review the pathology in the a.m. on 12/26/2017.  He has left hydronephrosis.  I contacted urology for an urgent referral to consider placement of a left ureter stent.  I recommend CHOP-rituximab if he is confirmed to have large B-cell lymphoma.  We discussed potential toxicities associated with this regimen including the chance for nausea/vomiting, mucositis, alopecia, and hematologic toxicity.  We discussed the cardiac toxicities associated with doxorubicin.  We discussed the vesicant property of doxorubicin and vincristine.  We reviewed the neuropathy seen with vincristine.  We discussed the allergic reaction, pneumonitis, CNS toxicity, infections, and hematologic toxicity associated with rituximab.  He will attend a chemotherapy teaching class.  He agrees to proceed with CHOP-rituximab if a diagnosis of large cell lymphoma is confirmed.  We discussed the chance of tumor lysis syndrome.  Mr. Motton will be referred for Port-A-Cath placement and an echocardiogram.  He will begin allopurinol prophylaxis on the day following chemotherapy.  He will return for an office visit with the plan to begin chemotherapy on 01/01/2018.  40 minutes were spent with the patient today.  The majority of the time was used for counseling and coordination of care. Betsy Coder, MD  12/25/2017  4:25 PM

## 2017-12-25 NOTE — Progress Notes (Signed)
START ON PATHWAY REGIMEN - Lymphoma and CLL     A cycle is every 21 days:     Prednisone      Rituximab      Cyclophosphamide      Doxorubicin      Vincristine   **Always confirm dose/schedule in your pharmacy ordering system**  Patient Characteristics: Diffuse Large B-Cell Lymphoma, First Line, Stage III and IV Disease Type: Not Applicable Disease Type: Diffuse Large B-Cell Lymphoma Disease Type: Not Applicable Line of therapy: First Line [place] Stage: Unknown Intent of Therapy: Curative Intent, Discussed with Patient 

## 2017-12-25 NOTE — H&P (Signed)
Chief Complaint: Patient was seen in consultation today for non-Hodgkin's lymphoma.  Referring Physician(s): Ladell Pier  Supervising Physician: Sandi Mariscal  Patient Status: [facility] - Out-pt  History of Present Illness: David Little is a 82 y.o. male with a past medical history of CAD, MI 2007, hyperlipidemia, hypercholesterolemia, malignant hyperthermia, skin cancer of face, and non-Hodgkin's lymphoma currently in remission. He presented to his oncologist, Dr. Benay Spice, with complaints of abdominal pain, failure to thrive, and hypercalcemia for which he underwent workup for possible malignancy.  CT chest/abdomen/pevlis 12/24/2017: 1. There is a large retroperitoneal mass within the abdomen compatible with recurrent lymphoma. Tumor encases the branch vessels of the abdominal aorta as well as the left ureter and left gonadal vein. There is also encasement and narrowing of the portal venous confluence. The mass also involves the pancreas at the level of the neck resulting in atrophy of the pancreatic body and tail with dilatation of the main duct. Left lateral extension of the tumor into the left upper pole renal sinus and possibly upper pole cortex of the left kidney noted. Suggest further evaluation with PET-CT. 2. There is an indeterminate low-attenuation structure identified within the lateral segment of left lobe of liver. This could be better assessed with PET-CT or contrast enhanced liver protocol MRI. 3. There is a low-attenuation tubular structure within the right lower quadrant of the abdomen which is concerning for appendiceal mucocele versus chronic appendicitis. Assuming no symptoms of appendicitis, further investigation with either surgical resection or colonoscopy is advised. 4. Indeterminate nodule in the left lower lobe measures 6 mm. Nonspecific. 5.  Aortic Atherosclerosis (ICD10-I70.0). 6. Right kidney stones 7. Mild prostate gland enlargement with evidence of chronic  bladder outlet obstruction including bladder wall trabeculation and diverticula formation.  IR requested by Dr. Benay Spice for possible image-guided bone marrow aspiration/biopsy.  Patient awake and alert laying in bed with no complaints at this time. Accompanied by wife at bedside. Denies fever, chills, chest pain, dyspnea, abdominal pain, dizziness, or headache.   Past Medical History:  Diagnosis Date  . Coronary artery disease 2007   s/p CABG in 2007 for severe left main disease  . Hypercholesterolemia   . Hyperlipidemia   . Lymphoma ([place]) 2003   without recurrence  . Myocardial infarction ([place]) 2007  . Normal nuclear stress test March 2012   EF 55%. No ischemia. Mild inferior scar  . Skin cancer of face     Past Surgical History:  Procedure Laterality Date  . CORONARY ARTERY BYPASS GRAFT  2007   Had coronary artery bypass grafting for severe left main coronary artery stenosis--Had follow up stress cardiolite study in February 2010 which showed EF of 55% with mild inferior hypokinesia--There was no ischemia  . INGUINAL HERNIA REPAIR Left 05/31/2016   Procedure: LEFT INGUINAL HERNIA REPAIR WITH MESH;  Surgeon: Jackolyn Confer, MD;  Location: [facility];  Service: General;  Laterality: Left;  . INSERTION OF MESH Left 05/31/2016   Procedure: INSERTION OF MESH;  Surgeon: Jackolyn Confer, MD;  Location: [facility];  Service: General;  Laterality: Left;  . TOTAL KNEE ARTHROPLASTY Left 09/29/2013   Procedure: TOTAL KNEE ARTHROPLASTY;  Surgeon: Alta Corning, MD;  Location: [place];  Service: Orthopedics;  Laterality: Left;    Allergies: Patient has no known allergies.  Medications: Prior to Admission medications   Medication Sig Start Date End Date Taking? Authorizing Provider  aspirin EC 81 MG tablet Take 1 tablet (81 mg total) by mouth daily. 06/17/17  Almyra Deforest, [place]  metoprolol tartrate (LOPRESSOR) 25 MG tablet Take 1 tablet (25 mg total) by mouth 2 (two) times daily. 05/07/17   Minus Breeding, MD  Multiple Vitamin (MULTIVITAMIN PO) Take 1 tablet by mouth daily.     [provider]  naproxen sodium (ANAPROX) 220 MG tablet Take 440 mg by mouth daily as needed (pain).    [provider]  nitroGLYCERIN (NITROSTAT) 0.4 MG SL tablet Place 1 tablet (0.4 mg total) under the tongue as needed. Patient not taking: Reported on 12/19/2017 05/01/17   Barrett, Evelene Croon, PA-C  Polyethyl Glycol-Propyl Glycol (SYSTANE OP) Apply 1 drop to eye daily as needed (dry eyes).    [provider]  simvastatin (ZOCOR) 40 MG tablet Take 1 tablet (40 mg total) by mouth daily at 6 PM. 05/07/17   Minus Breeding, MD     Family History  Problem Relation Age of Onset  . Heart attack Brother 35       possible, not confirmed  . Unexplained death Mother 23  . Cancer Father 71    Social History   Socioeconomic History  . Marital status: Married    Spouse name: Not on file  . Number of children: Not on file  . Years of education: Not on file  . Highest education level: Not on file  Occupational History  . Occupation: Retired  Scientific laboratory technician  . Financial resource strain: Not on file  . Food insecurity:    Worry: Not on file    Inability: Not on file  . Transportation needs:    Medical: Not on file    Non-medical: Not on file  Tobacco Use  . Smoking status: Former Smoker    Packs/day: 1.00    Years: 30.00    Pack years: 30.00    Last attempt to quit: 09/22/1973    Years since quitting: 44.2  . Smokeless tobacco: Current User    Types: Chew  Substance and Sexual Activity  . Alcohol use: No  . Drug use: No  . Sexual activity: Not on file  Lifestyle  . Physical activity:    Days per week: Not on file    Minutes per session: Not on file  . Stress: Not on file  Relationships  . Social connections:    Talks on phone: Not on file    Gets together: Not on file    Attends religious service: Not on file    Active member of club or organization: Not on file    Attends  meetings of clubs or organizations: Not on file    Relationship status: Not on file  Other Topics Concern  . Not on file  Social History Narrative   Pt lives with wife in [place].      Review of Systems: A 12 point ROS discussed and pertinent positives are indicated in the HPI above.  All other systems are negative.  Review of Systems  Constitutional: Negative for chills and fever.  Respiratory: Negative for shortness of breath and wheezing.   Cardiovascular: Negative for chest pain and palpitations.  Gastrointestinal: Negative for abdominal pain.  Neurological: Negative for dizziness and headaches.  Psychiatric/Behavioral: Negative for behavioral problems and confusion.    Vital Signs: BP 125/72   Pulse 77   Temp 98.1 F (36.7 C) (Oral)   Resp 18   SpO2 100%   Physical Exam  Constitutional: He is oriented to person, place, and time. He appears well-developed and well-nourished. No distress.  Cardiovascular: Normal rate, regular rhythm and normal heart sounds.  No murmur heard. Pulmonary/Chest: Effort normal and breath sounds normal. No respiratory distress. He has no wheezes.  Neurological: He is alert and oriented to person, place, and time.  Skin: Skin is warm and dry.  Psychiatric: He has a normal mood and affect. His behavior is normal. Judgment and thought content normal.  Nursing note and vitals reviewed.    MD Evaluation Airway: WNL Heart: WNL Abdomen: WNL Chest/ Lungs: WNL ASA  Classification: 3 Mallampati/Airway Score: One   Imaging: Dg Chest 2 View  Result Date: 12/09/2017 CLINICAL DATA:  Shortness of breath. EXAM: CHEST - 2 VIEW COMPARISON:  Chest x-ray 09/22/2017. FINDINGS: Prior CABG. Mediastinum hilar structures normal. No pleural effusion or pneumothorax. Heart size normal. No acute bony abnormality. IMPRESSION: 1.  Prior CABG.  Heart size normal. 2.  No acute pulmonary disease. Electronically Signed   By: Marcello Moores  Register   On: 12/09/2017 08:35    Ct Chest W Contrast  Result Date: 12/24/2017 CLINICAL DATA:  Remote history of non-Hodgkin's lymphoma. Now with 15 pound weight loss. EXAM: CT CHEST, ABDOMEN, AND PELVIS WITH CONTRAST TECHNIQUE: Multidetector CT imaging of the chest, abdomen and pelvis was performed following the standard protocol during bolus administration of intravenous contrast. CONTRAST:  31mL ISOVUE-300 IOPAMIDOL (ISOVUE-300) INJECTION 61%, 156mL OMNIPAQUE IOHEXOL 300 MG/ML SOLN COMPARISON:  None FINDINGS: CT CHEST FINDINGS Cardiovascular: The patient is status post median sternotomy and CABG procedure. No pericardial effusion identified. Aortic atherosclerosis noted. Mediastinum/Nodes: Normal appearance of the thyroid gland. The trachea appears patent and is midline. Normal appearance of the esophagus. No enlarged axillary, supraclavicular, mediastinal or hilar lymph nodes. Lungs/Pleura: Small left pleural effusion. No airspace consolidation, atelectasis or pneumothorax. Mild peripheral interstitial reticulation is identified. Small round solid nodule in the left posterior lung base measures 6 mm, image 133/4. Musculoskeletal: No chest wall mass or suspicious bone lesions identified. CT ABDOMEN PELVIS FINDINGS Hepatobiliary: Within the left lobe of liver there is an indeterminate intermediate attenuating structure measuring 1.4 cm, image 53/2. The gallbladder is unremarkable. No biliary dilatation. Pancreas: Beginning at the level of the pancreatic neck and extending through the tail of pancreas is diffuse parenchymal volume loss with dilatation of the main duct. Main duct dilatation measures up to 8 mm. Large retroperitoneal mass is present which results in anterior displacement of the pancreas. Invasion of the pancreatic parenchyma may be present which would explain the main duct dilatation. Spleen: Normal size of the spleen.  No focal abnormality. Adrenals/Urinary Tract: Both adrenal glands are normal. Nonobstructing calculus within  the upper pole of right kidney measures 2 mm, image 62/2. The left kidney is hydronephrotic. There is marked dilatation of the left renal pelvis, image 76/2. Retroperitoneal tumor encases and narrows the left renal artery and vein. Retroperitoneal tumor extension into the left upper pole renal sinus is identified, image 87/5. Hyperdense, solid-appearing nodule within the medial cortex of the upper pole of right kidney measures 1.1 cm in may reflect direct extension from retroperitoneal tumor. Bladder wall trabeculation is identified. Right posterior bladder wall diverticula measures 2.6 cm. Stomach/Bowel: There is no abnormal distension of the gastric lumen. No pathologically enlarged small bowel loops. Dilated low-attenuation tubular structure within the right lower quadrant of the abdomen which appears contiguous with the cecal base measures 3 cm in diameter and may represent a large appendiceal mucocele versus chronic appendicitis. No surrounding inflammatory changes identified. No pathologic dilatation of the colon. Extensive colonic diverticulosis identified. Vascular/Lymphatic: Aortic  atherosclerosis without aneurysm. There is a large retroperitoneal soft tissue mass which completely encases the abdominal aorta, celiac artery, SMA, IMA and bilateral renal arteries. The mass measures 9.3 by 7.4 by 12.2 cm, image 65/2 and image 64/5. Encasement and narrowing of the portal venous confluence is noted. The main portal vein and intrahepatic portal veins appear patent. Retroperitoneal tumor appears to encase the left ureter and left gonadal vein and extends along the structures into the upper pelvis. No pelvic or inguinal adenopathy identified. Reproductive: Prostate gland appears mildly enlarged and nodular with mass effect upon the bladder base. Other: No ascites or focal fluid collections. Musculoskeletal: The bones appear osteopenic. No aggressive lytic or sclerotic bone lesions identified. IMPRESSION: 1. There  is a large retroperitoneal mass within the abdomen compatible with recurrent lymphoma. Tumor encases the branch vessels of the abdominal aorta as well as the left ureter and left gonadal vein. There is also encasement and narrowing of the portal venous confluence. The mass also involves the pancreas at the level of the neck resulting in atrophy of the pancreatic body and tail with dilatation of the main duct. Left lateral extension of the tumor into the left upper pole renal sinus and possibly upper pole cortex of the left kidney noted. Suggest further evaluation with PET-CT. 2. There is an indeterminate low-attenuation structure identified within the lateral segment of left lobe of liver. This could be better assessed with PET-CT or contrast enhanced liver protocol MRI. 3. There is a low-attenuation tubular structure within the right lower quadrant of the abdomen which is concerning for appendiceal mucocele versus chronic appendicitis. Assuming no symptoms of appendicitis, further investigation with either surgical resection or colonoscopy is advised. 4. Indeterminate nodule in the left lower lobe measures 6 mm. Nonspecific. 5.  Aortic Atherosclerosis (ICD10-I70.0). 6. Right kidney stones 7. Mild prostate gland enlargement with evidence of chronic bladder outlet obstruction including bladder wall trabeculation and diverticula formation. Electronically Signed   By: Kerby Moors M.D.   On: 12/24/2017 17:22   Ct Abdomen Pelvis W Contrast  Result Date: 12/24/2017 CLINICAL DATA:  Remote history of non-Hodgkin's lymphoma. Now with 15 pound weight loss. EXAM: CT CHEST, ABDOMEN, AND PELVIS WITH CONTRAST TECHNIQUE: Multidetector CT imaging of the chest, abdomen and pelvis was performed following the standard protocol during bolus administration of intravenous contrast. CONTRAST:  54mL ISOVUE-300 IOPAMIDOL (ISOVUE-300) INJECTION 61%, 140mL OMNIPAQUE IOHEXOL 300 MG/ML SOLN COMPARISON:  None FINDINGS: CT CHEST FINDINGS  Cardiovascular: The patient is status post median sternotomy and CABG procedure. No pericardial effusion identified. Aortic atherosclerosis noted. Mediastinum/Nodes: Normal appearance of the thyroid gland. The trachea appears patent and is midline. Normal appearance of the esophagus. No enlarged axillary, supraclavicular, mediastinal or hilar lymph nodes. Lungs/Pleura: Small left pleural effusion. No airspace consolidation, atelectasis or pneumothorax. Mild peripheral interstitial reticulation is identified. Small round solid nodule in the left posterior lung base measures 6 mm, image 133/4. Musculoskeletal: No chest wall mass or suspicious bone lesions identified. CT ABDOMEN PELVIS FINDINGS Hepatobiliary: Within the left lobe of liver there is an indeterminate intermediate attenuating structure measuring 1.4 cm, image 53/2. The gallbladder is unremarkable. No biliary dilatation. Pancreas: Beginning at the level of the pancreatic neck and extending through the tail of pancreas is diffuse parenchymal volume loss with dilatation of the main duct. Main duct dilatation measures up to 8 mm. Large retroperitoneal mass is present which results in anterior displacement of the pancreas. Invasion of the pancreatic parenchyma may be present which would explain the  main duct dilatation. Spleen: Normal size of the spleen.  No focal abnormality. Adrenals/Urinary Tract: Both adrenal glands are normal. Nonobstructing calculus within the upper pole of right kidney measures 2 mm, image 62/2. The left kidney is hydronephrotic. There is marked dilatation of the left renal pelvis, image 76/2. Retroperitoneal tumor encases and narrows the left renal artery and vein. Retroperitoneal tumor extension into the left upper pole renal sinus is identified, image 87/5. Hyperdense, solid-appearing nodule within the medial cortex of the upper pole of right kidney measures 1.1 cm in may reflect direct extension from retroperitoneal tumor. Bladder  wall trabeculation is identified. Right posterior bladder wall diverticula measures 2.6 cm. Stomach/Bowel: There is no abnormal distension of the gastric lumen. No pathologically enlarged small bowel loops. Dilated low-attenuation tubular structure within the right lower quadrant of the abdomen which appears contiguous with the cecal base measures 3 cm in diameter and may represent a large appendiceal mucocele versus chronic appendicitis. No surrounding inflammatory changes identified. No pathologic dilatation of the colon. Extensive colonic diverticulosis identified. Vascular/Lymphatic: Aortic atherosclerosis without aneurysm. There is a large retroperitoneal soft tissue mass which completely encases the abdominal aorta, celiac artery, SMA, IMA and bilateral renal arteries. The mass measures 9.3 by 7.4 by 12.2 cm, image 65/2 and image 64/5. Encasement and narrowing of the portal venous confluence is noted. The main portal vein and intrahepatic portal veins appear patent. Retroperitoneal tumor appears to encase the left ureter and left gonadal vein and extends along the structures into the upper pelvis. No pelvic or inguinal adenopathy identified. Reproductive: Prostate gland appears mildly enlarged and nodular with mass effect upon the bladder base. Other: No ascites or focal fluid collections. Musculoskeletal: The bones appear osteopenic. No aggressive lytic or sclerotic bone lesions identified. IMPRESSION: 1. There is a large retroperitoneal mass within the abdomen compatible with recurrent lymphoma. Tumor encases the branch vessels of the abdominal aorta as well as the left ureter and left gonadal vein. There is also encasement and narrowing of the portal venous confluence. The mass also involves the pancreas at the level of the neck resulting in atrophy of the pancreatic body and tail with dilatation of the main duct. Left lateral extension of the tumor into the left upper pole renal sinus and possibly upper  pole cortex of the left kidney noted. Suggest further evaluation with PET-CT. 2. There is an indeterminate low-attenuation structure identified within the lateral segment of left lobe of liver. This could be better assessed with PET-CT or contrast enhanced liver protocol MRI. 3. There is a low-attenuation tubular structure within the right lower quadrant of the abdomen which is concerning for appendiceal mucocele versus chronic appendicitis. Assuming no symptoms of appendicitis, further investigation with either surgical resection or colonoscopy is advised. 4. Indeterminate nodule in the left lower lobe measures 6 mm. Nonspecific. 5.  Aortic Atherosclerosis (ICD10-I70.0). 6. Right kidney stones 7. Mild prostate gland enlargement with evidence of chronic bladder outlet obstruction including bladder wall trabeculation and diverticula formation. Electronically Signed   By: Kerby Moors M.D.   On: 12/24/2017 17:22    Labs:  CBC: Recent Labs    12/08/17 1602 12/19/17 1559 12/25/17 1043  WBC 9.8 9.3 9.9  HGB 11.5* 11.7* 11.8*  HCT 37.2* 36.4* 36.5*  PLT 317 233 245    COAGS: No results for input(s): INR, APTT in the last 8760 hours.  BMP: Recent Labs    05/01/17 1112 12/08/17 1602 12/19/17 1559 12/24/17 1338  NA 143 143 144 141  K 4.8 3.9 4.2 3.7  CL 107* 102 106 104  CO2 25 24 26 24   GLUCOSE 73 128* 81 81  BUN 21 17 21 18   CALCIUM 9.5 10.8* 11.7* 10.5*  CREATININE 1.11 1.26 1.38* 1.28*  GFRNONAA 63 54* 47* 52*  GFRAA 73 62 55* 60*    LIVER FUNCTION TESTS: Recent Labs    05/01/17 1112 12/08/17 1602 12/19/17 1559 12/24/17 1338  BILITOT 0.4 0.3 0.4 0.5  AST 20 20 24 24   ALT 11 12 11 12   ALKPHOS 63 86 93 85  PROT 6.7 6.4 7.4 6.7  ALBUMIN 4.3 3.8 3.5 3.2*    TUMOR MARKERS: No results for input(s): AFPTM, CEA, CA199, CHROMGRNA in the last 8760 hours.  Assessment and Plan:  Non-Hodgkin's lymphoma. Plan for image-guided bone marrow aspiration/biopsy today with Dr.  Pascal Lux. Patient is NPO. Denies fever. He does not take blood thinners. INR pending.  Risks and benefits discussed with the patient including, but not limited to bleeding, infection, damage to adjacent structures or low yield requiring additional tests. All of the patient's questions were answered, patient is agreeable to proceed. Consent signed and in chart.   Thank you for this interesting consult.  I greatly enjoyed meeting David Little and look forward to participating in their care.  A copy of this report was sent to the requesting provider on this date.  Electronically Signed: Earley Abide, PA-C 12/25/2017, 11:06 AM   I spent a total of 30 Minutes in face to face in clinical consultation, greater than 50% of which was counseling/coordinating care for non-Hodgkin's lymphoma.

## 2017-12-25 NOTE — Discharge Instructions (Addendum)
Moderate Conscious Sedation, Adult, Care After °These instructions provide you with information about caring for yourself after your procedure. Your health care provider may also give you more specific instructions. Your treatment has been planned according to current medical practices, but problems sometimes occur. Call your health care provider if you have any problems or questions after your procedure. °What can I expect after the procedure? °After your procedure, it is common: °· To feel sleepy for several hours. °· To feel clumsy and have poor balance for several hours. °· To have poor judgment for several hours. °· To vomit if you eat too soon. ° °Follow these instructions at home: °For at least 24 hours after the procedure: ° °· Do not: °? Participate in activities where you could fall or become injured. °? Drive. °? Use heavy machinery. °? Drink alcohol. °? Take sleeping pills or medicines that cause drowsiness. °? Make important decisions or sign legal documents. °? Take care of children on your own. °· Rest. °Eating and drinking °· Follow the diet recommended by your health care provider. °· If you vomit: °? Drink water, juice, or soup when you can drink without vomiting. °? Make sure you have little or no nausea before eating solid foods. °General instructions °· Have a responsible adult stay with you until you are awake and alert. °· Take over-the-counter and prescription medicines only as told by your health care provider. °· If you smoke, do not smoke without supervision. °· Keep all follow-up visits as told by your health care provider. This is important. °Contact a health care provider if: °· You keep feeling nauseous or you keep vomiting. °· You feel light-headed. °· You develop a rash. °· You have a fever. °Get help right away if: °· You have trouble breathing. °This information is not intended to replace advice given to you by your health care provider. Make sure you discuss any questions you have  with your health care provider. °Document Released: 02/17/2013 Document Revised: 10/02/2015 Document Reviewed: 08/19/2015 °Elsevier Interactive Patient Education © 2018 [employer] °Bone Marrow Aspiration and Bone Marrow Biopsy, Adult, Care After °This sheet gives you information about how to care for yourself after your procedure. Your health care provider may also give you more specific instructions. If you have problems or questions, contact your health care provider. °What can I expect after the procedure? °After the procedure, it is common to have: °· Mild pain and tenderness. °· Swelling. °· Bruising. ° °Follow these instructions at home: °· Take over-the-counter or prescription medicines only as told by your health care provider. °· Do not take baths, swim, or use a hot tub until your health care provider approves. Ask if you can take a shower or have a sponge bath. °· Follow instructions from your health care provider about how to take care of the puncture site. Make sure you: °? Wash your hands with soap and water before you change your bandage (dressing). If soap and water are not available, use hand sanitizer. °? Change your dressing as told by your health care provider. °· Check your puncture site every day for signs of infection. Check for: °? More redness, swelling, or pain. °? More fluid or blood. °? Warmth. °? Pus or a bad smell. °· Return to your normal activities as told by your health care provider. Ask your health care provider what activities are safe for you. °· Do not drive for 24 hours if you were given a medicine to help you relax (sedative). °·   Keep all follow-up visits as told by your health care provider. This is important. °Contact a health care provider if: °· You have more redness, swelling, or pain around the puncture site. °· You have more fluid or blood coming from the puncture site. °· Your puncture site feels warm to the touch. °· You have pus or a bad smell coming from the  puncture site. °· You have a fever. °· Your pain is not controlled with medicine. °This information is not intended to replace advice given to you by your health care provider. Make sure you discuss any questions you have with your health care provider. °Document Released: 11/16/2004 Document Revised: 11/17/2015 Document Reviewed: 10/11/2015 °Elsevier Interactive Patient Education © 2018 [employer] ° °

## 2017-12-26 ENCOUNTER — Telehealth: Payer: Self-pay | Admitting: Oncology

## 2017-12-26 ENCOUNTER — Other Ambulatory Visit: Payer: Self-pay

## 2017-12-26 ENCOUNTER — Ambulatory Visit (HOSPITAL_COMMUNITY): Payer: Medicare Other

## 2017-12-26 ENCOUNTER — Telehealth: Payer: Self-pay

## 2017-12-26 DIAGNOSIS — C8590 Non-Hodgkin lymphoma, unspecified, unspecified site: Secondary | ICD-10-CM

## 2017-12-26 MED ORDER — PROCHLORPERAZINE MALEATE 10 MG PO TABS: 10.0000 mg | ORAL_TABLET | Freq: Four times a day (QID) | ORAL | 2 refills | Status: AC | PRN

## 2017-12-26 MED ORDER — PREDNISONE 20 MG PO TABS: 60.0000 mg | ORAL_TABLET | Freq: Every day | ORAL | 0 refills | Status: DC

## 2017-12-26 MED ORDER — ALLOPURINOL 100 MG PO TABS: 100.0000 mg | ORAL_TABLET | Freq: Every day | ORAL | 0 refills | Status: DC

## 2017-12-26 NOTE — Telephone Encounter (Signed)
Appts scheduled added first tx to infusion log patient has been notified per 8/15 los

## 2017-12-26 NOTE — Telephone Encounter (Signed)
Spoke with pt. Per Dr. Benay Spice, pt does have lymphoma as expected and we will proceed as discussed. Pt voiced understanding. This RN will make sure that the pt has port placement and PET scan scheduled. Pt voiced understanding.

## 2017-12-28 ENCOUNTER — Other Ambulatory Visit: Payer: Self-pay | Admitting: Oncology

## 2017-12-29 ENCOUNTER — Telehealth: Payer: Self-pay

## 2017-12-29 DIAGNOSIS — N1330 Unspecified hydronephrosis: Secondary | ICD-10-CM | POA: Diagnosis not present

## 2017-12-29 NOTE — Telephone Encounter (Signed)
Returned call to pt wife regarding appts for today. Pt wife states that he received a call about an appt in the [employer] today. No record of an appt in our system. Pt wife provided with phone number to call and inquire.

## 2017-12-30 ENCOUNTER — Other Ambulatory Visit: Payer: Self-pay | Admitting: Urology

## 2017-12-30 ENCOUNTER — Telehealth: Payer: Self-pay

## 2017-12-30 ENCOUNTER — Telehealth: Payer: Self-pay | Admitting: Cardiology

## 2017-12-30 ENCOUNTER — Other Ambulatory Visit: Payer: Self-pay

## 2017-12-30 NOTE — Telephone Encounter (Signed)
S/w pt is aware surgery has to be cancelled for 8/30 due to pt needing appt in office since pt has not been seen in 6 months.  9/3 is first available for appt with this office.   Will call Eskridge's office and cancel surgery.  Informed Truitt Merle will relook at this pre-op to advise due to pt being seen in February and upcoming test that will be performed tomorrow.

## 2017-12-30 NOTE — Telephone Encounter (Signed)
I have spoken to David Little.   His GU surgery is for next Friday - our office does not have availability to see him until September 3rd. He has recurrence of large cell lymphoma.   I have spoken to David Little by phone. He has no active chest pain. He does have generalized weakness from his cancer. He will be having an echo tomorrow at [facility]. He could walk a block - but at a slow pace. Still doing yard work.   Low risk Myoview from 06/2017 noted.   I think we can clear him as long as echo is ok. I will speak to Dr. Percival Spanish as well. I have left his appointment in - we can cancel if not needed.   Burtis Junes, RN, [facility] [address] [place], [place]  [id number] [phone]

## 2017-12-30 NOTE — Telephone Encounter (Signed)
Spoke with pt wife regarding additional upcoming appts. Informed pt of temporary PICC placement tomorrow afternoon and addition of PET on 8/27. Pt wife voiced understanding. Will provide printout and necessary instructions tomorrow in office. Pt wife voiced understanding

## 2017-12-30 NOTE — Telephone Encounter (Signed)
   Primary Cardiologist: Hochrein Chart reviewed as part of pre-operative protocol coverage. Because of David Little past medical history and time since last visit, he/she will require a follow-up visit in order to better assess preoperative cardiovascular risk.  Pre-op covering staff: - Please schedule appointment and call patient to inform them. - Please contact requesting surgeon's office via preferred method (i.e, phone, fax) to inform them of need for appointment prior to surgery.  Truitt Merle, NP  12/30/2017, 2:00 PM

## 2017-12-30 NOTE — Telephone Encounter (Signed)
I agree that he is at acceptable risk for the planned procedure.  Echo results pending

## 2017-12-30 NOTE — Telephone Encounter (Signed)
New Message       [facility] Pre-operative Risk Assessment    Request for surgical clearance:  1. What type of surgery is being performed? cystocopy and a bilaterial stent ureterial stint placement   2. When is this surgery scheduled? TBD  3. What type of clearance is required (medical clearance vs. Pharmacy clearance to hold med vs. Both)? Medical  4. Are there any medications that need to be held prior to surgery and how long?    5. Practice name and name of physician performing surgery? [facility] Dr. Junious Silk  6. What is your office phone number [phone]    7.   What is your office fax number [phone]  8.   Anesthesia type (None, local, MAC, general) ? General    David Little 12/30/2017, 12:42 PM  _________________________________________________________________   (provider comments below)

## 2017-12-31 ENCOUNTER — Ambulatory Visit (HOSPITAL_COMMUNITY)
Admission: RE | Admit: 2017-12-31 | Discharge: 2017-12-31 | Disposition: A | Discharge status: Home / Self Care | Payer: Medicare Other | Source: Ambulatory Visit | Attending: Oncology | Admitting: Oncology

## 2017-12-31 ENCOUNTER — Other Ambulatory Visit: Payer: Self-pay

## 2017-12-31 ENCOUNTER — Other Ambulatory Visit: Payer: Self-pay | Admitting: Oncology

## 2017-12-31 ENCOUNTER — Inpatient Hospital Stay: Payer: Medicare Other

## 2017-12-31 ENCOUNTER — Other Ambulatory Visit (HOSPITAL_COMMUNITY): Payer: Self-pay

## 2017-12-31 ENCOUNTER — Inpatient Hospital Stay (HOSPITAL_BASED_OUTPATIENT_CLINIC_OR_DEPARTMENT_OTHER): Payer: Medicare Other | Admitting: Oncology

## 2017-12-31 VITALS — BP 136/75 | HR 76 | Temp 97.8°F | Resp 17 | Ht 70.0 in | Wt 150.9 lb

## 2017-12-31 DIAGNOSIS — C8590 Non-Hodgkin lymphoma, unspecified, unspecified site: Secondary | ICD-10-CM

## 2017-12-31 DIAGNOSIS — Z5111 Encounter for antineoplastic chemotherapy: Secondary | ICD-10-CM

## 2017-12-31 DIAGNOSIS — D701 Agranulocytosis secondary to cancer chemotherapy: Secondary | ICD-10-CM | POA: Diagnosis not present

## 2017-12-31 DIAGNOSIS — C8330 Diffuse large B-cell lymphoma, unspecified site: Secondary | ICD-10-CM | POA: Diagnosis not present

## 2017-12-31 DIAGNOSIS — Z5112 Encounter for antineoplastic immunotherapy: Secondary | ICD-10-CM | POA: Diagnosis not present

## 2017-12-31 DIAGNOSIS — R109 Unspecified abdominal pain: Secondary | ICD-10-CM | POA: Diagnosis not present

## 2017-12-31 DIAGNOSIS — I080 Rheumatic disorders of both mitral and aortic valves: Secondary | ICD-10-CM | POA: Insufficient documentation

## 2017-12-31 DIAGNOSIS — R112 Nausea with vomiting, unspecified: Secondary | ICD-10-CM | POA: Diagnosis not present

## 2017-12-31 DIAGNOSIS — R1909 Other intra-abdominal and pelvic swelling, mass and lump: Secondary | ICD-10-CM | POA: Diagnosis not present

## 2017-12-31 DIAGNOSIS — Z8572 Personal history of non-Hodgkin lymphomas: Secondary | ICD-10-CM | POA: Diagnosis not present

## 2017-12-31 DIAGNOSIS — N1330 Unspecified hydronephrosis: Secondary | ICD-10-CM

## 2017-12-31 DIAGNOSIS — R627 Adult failure to thrive: Secondary | ICD-10-CM | POA: Diagnosis not present

## 2017-12-31 DIAGNOSIS — Z5189 Encounter for other specified aftercare: Secondary | ICD-10-CM | POA: Diagnosis not present

## 2017-12-31 DIAGNOSIS — B370 Candidal stomatitis: Secondary | ICD-10-CM | POA: Diagnosis not present

## 2017-12-31 DIAGNOSIS — K5900 Constipation, unspecified: Secondary | ICD-10-CM | POA: Diagnosis not present

## 2017-12-31 LAB — CMP (CANCER CENTER ONLY)
ALBUMIN: 3.3 g/dL — AB (ref 3.5–5.0)
ALK PHOS: 95 U/L (ref 38–126)
ALT: 11 U/L (ref 0–44)
ANION GAP: 10 (ref 5–15)
AST: 22 U/L (ref 15–41)
BUN: 13 mg/dL (ref 8–23)
CHLORIDE: 107 mmol/L (ref 98–111)
CO2: 26 mmol/L (ref 22–32)
Calcium: 9.9 mg/dL (ref 8.9–10.3)
Creatinine: 1.05 mg/dL (ref 0.61–1.24)
GFR, Est AFR Am: >60 mL/min (ref >60)
GFR, Estimated: >60 mL/min (ref >60)
GLUCOSE: 136 mg/dL — AB (ref 70–99)
Potassium: 3.3 mmol/L — ABNORMAL LOW (ref 3.5–5.1)
SODIUM: 143 mmol/L (ref 135–145)
Total Bilirubin: 0.5 mg/dL (ref 0.3–1.2)
Total Protein: 6.9 g/dL (ref 6.5–8.1)

## 2017-12-31 LAB — CBC WITH DIFFERENTIAL (CANCER CENTER ONLY)
Basophils Absolute: 0.1 10*3/uL (ref 0.0–0.1)
Basophils Relative: 1 %
Eosinophils Absolute: 0.2 10*3/uL (ref 0.0–0.5)
Eosinophils Relative: 2 %
HEMATOCRIT: 34 % — AB (ref 38.4–49.9)
Hemoglobin: 11.2 g/dL — ABNORMAL LOW (ref 13.0–17.1)
LYMPHS ABS: 1.6 10*3/uL (ref 0.9–3.3)
LYMPHS PCT: 18 %
MCH: 27.2 pg (ref 27.2–33.4)
MCHC: 32.8 g/dL (ref 32.0–36.0)
MCV: 83 fL (ref 79.3–98.0)
MONO ABS: 0.7 10*3/uL (ref 0.1–0.9)
MONOS PCT: 8 %
NEUTROS ABS: 6.5 10*3/uL (ref 1.5–6.5)
NEUTROS PCT: 71 %
Platelet Count: 244 10*3/uL (ref 140–400)
RBC: 4.09 MIL/uL — ABNORMAL LOW (ref 4.20–5.82)
RDW: 14.1 % (ref 11.0–14.6)
WBC Count: 9 10*3/uL (ref 4.0–10.3)

## 2017-12-31 LAB — URIC ACID: URIC ACID, SERUM: 6.2 mg/dL (ref 3.7–8.6)

## 2017-12-31 MED ORDER — LIDOCAINE HCL 1 % IJ SOLN
INTRAMUSCULAR | Status: AC
  Administered 2017-12-31: 5 mL
  Filled 2017-12-31: qty 20

## 2017-12-31 MED ORDER — HEPARIN SOD (PORK) LOCK FLUSH 100 UNIT/ML IV SOLN
INTRAVENOUS | Status: AC
  Administered 2017-12-31: 500 [IU]
  Filled 2017-12-31: qty 5

## 2017-12-31 NOTE — Progress Notes (Signed)
[facility] OFFICE PROGRESS NOTE   Diagnosis: Non-Hodgkin's lymphoma  INTERVAL HISTORY:   David Little returns as scheduled.  He continues to have abdomen and back pain.  The pathology from the retroperitoneal mass biopsy revealed large B-cell lymphoma, kappa light chain restricted, CD20 and CD10 positive.  There is a diffuse appearance with no follicles.  An echocardiogram today revealed a left ventricular ejection fraction of 50-55% with mild diffuse hypokinesis.  Objective:  Vital signs in last 24 hours:  Blood pressure 136/75, pulse 76, temperature 97.8 F (36.6 C), temperature source Oral, resp. rate 17, height 5\' 10"  (1.778 m), weight 150 lb 14.4 oz (68.4 kg), SpO2 100 %.   Resp: Lungs clear bilaterally Cardio: Regular rate and rhythm GI: No hepatosplenomegaly, masslike fullness at the upper abdomen-most prominent to the left of midline Vascular: No leg edema   Lab Results:  Lab Results  Component Value Date   WBC 9.0 12/31/2017   HGB 11.2 (L) 12/31/2017   HCT 34.0 (L) 12/31/2017   MCV 83.0 12/31/2017   PLT 244 12/31/2017   NEUTROABS 6.5 12/31/2017    CMP  Lab Results  Component Value Date   NA 143 12/31/2017   K 3.3 (L) 12/31/2017   CL 107 12/31/2017   CO2 26 12/31/2017   GLUCOSE 136 (H) 12/31/2017   BUN 13 12/31/2017   CREATININE 1.05 12/31/2017   CALCIUM 9.9 12/31/2017   PROT 6.9 12/31/2017   ALBUMIN 3.3 (L) 12/31/2017   AST 22 12/31/2017   ALT 11 12/31/2017   ALKPHOS 95 12/31/2017   BILITOT 0.5 12/31/2017   GFRNONAA >60 12/31/2017   GFRAA >60 12/31/2017    No results found for: CEA1  Lab Results  Component Value Date   INR 1.12 12/25/2017    Imaging:  No results found.  Medications: I have reviewed the patient's current medications.   Assessment/Plan: 1. Non-Hodgkin lymphoma - he completed fludarabine chemotherapy in February 2003. He remains in clinical remission.  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  Indeterminate lesion in the left liver, tubular structure in the right lower quadrant concerning for mucocele, indeterminate left lower lobe nodule  CT biopsy of retroperitoneal mass 12/25/2017- diffuse large B-cell lymphoma, CD20 positive  2. History of coronary artery disease - status post coronary artery bypass surgery. 3. Pneumococcal vaccine in April 2014 4. Left knee pain and swelling-? Left knee arthritis 5.Hypercalcemia 12/19/2017, status post Zometa 12/19/2017-improved 6.Elevated creatinine secondary to obstructing left peritoneal mass  CT 12/24/2017- left hydronephrosis secondary to obstructing retroperitoneal mass 7.Anorexia/weight loss/failure to thrive 8.   Echocardiogram 12/31/2017- LVEF 50-55%, mild diffuse hypokinesis    Disposition: David Little appears unchanged.  The retroperitoneal mass biopsy confirmed large cell lymphoma.  The plan is to begin R-CHOP chemotherapy tomorrow.  We again reviewed potential toxicities associated with this regimen and he agrees to proceed.  He will undergo PEG placement later today.  He will attend a chemotherapy teaching class today.  David Little will return for an office visit and nadir CBC on 01/09/2018.  He will begin allopurinol prophylaxis today.  He has been referred to the urology center to evaluate the left hydronephrosis and consider placement of a ureter stent. A staging PET scan could not be scheduled until early next week.  25 minutes  were spent with the patient today.  The majority of the time was used for counseling and coordination of care.    Betsy Coder, MD  12/31/2017  2:09 PM

## 2017-12-31 NOTE — Procedures (Signed)
Right single lumen basilic vein PICC placed.  Length 40 cm.  Tip SVC/right atrial junction.  Medication used-1% lidocaine to skin and subcutaneous tissue.  Okay to use.  No immediate complications.

## 2018-01-01 ENCOUNTER — Other Ambulatory Visit: Payer: Self-pay

## 2018-01-01 ENCOUNTER — Inpatient Hospital Stay: Payer: Medicare Other

## 2018-01-01 ENCOUNTER — Other Ambulatory Visit: Payer: Self-pay | Admitting: Oncology

## 2018-01-01 ENCOUNTER — Ambulatory Visit: Payer: Self-pay | Admitting: Oncology

## 2018-01-01 ENCOUNTER — Telehealth: Payer: Self-pay | Admitting: Oncology

## 2018-01-01 VITALS — BP 133/59 | HR 80 | Temp 98.7°F | Resp 16

## 2018-01-01 DIAGNOSIS — Z5112 Encounter for antineoplastic immunotherapy: Secondary | ICD-10-CM | POA: Diagnosis not present

## 2018-01-01 DIAGNOSIS — R112 Nausea with vomiting, unspecified: Secondary | ICD-10-CM | POA: Diagnosis not present

## 2018-01-01 DIAGNOSIS — N1330 Unspecified hydronephrosis: Secondary | ICD-10-CM | POA: Diagnosis not present

## 2018-01-01 DIAGNOSIS — K5900 Constipation, unspecified: Secondary | ICD-10-CM | POA: Diagnosis not present

## 2018-01-01 DIAGNOSIS — D701 Agranulocytosis secondary to cancer chemotherapy: Secondary | ICD-10-CM | POA: Diagnosis not present

## 2018-01-01 DIAGNOSIS — Z8572 Personal history of non-Hodgkin lymphomas: Secondary | ICD-10-CM | POA: Diagnosis not present

## 2018-01-01 DIAGNOSIS — R627 Adult failure to thrive: Secondary | ICD-10-CM | POA: Diagnosis not present

## 2018-01-01 DIAGNOSIS — Z5111 Encounter for antineoplastic chemotherapy: Secondary | ICD-10-CM | POA: Diagnosis not present

## 2018-01-01 DIAGNOSIS — C8590 Non-Hodgkin lymphoma, unspecified, unspecified site: Secondary | ICD-10-CM

## 2018-01-01 DIAGNOSIS — C8330 Diffuse large B-cell lymphoma, unspecified site: Secondary | ICD-10-CM | POA: Diagnosis not present

## 2018-01-01 DIAGNOSIS — R109 Unspecified abdominal pain: Secondary | ICD-10-CM | POA: Diagnosis not present

## 2018-01-01 DIAGNOSIS — R1909 Other intra-abdominal and pelvic swelling, mass and lump: Secondary | ICD-10-CM | POA: Diagnosis not present

## 2018-01-01 DIAGNOSIS — Z5189 Encounter for other specified aftercare: Secondary | ICD-10-CM | POA: Diagnosis not present

## 2018-01-01 DIAGNOSIS — B370 Candidal stomatitis: Secondary | ICD-10-CM | POA: Diagnosis not present

## 2018-01-01 LAB — HEPATITIS B CORE ANTIBODY, TOTAL: HEP B C TOTAL AB: NEGATIVE

## 2018-01-01 LAB — HEPATITIS B SURFACE ANTIGEN: Hepatitis B Surface Ag: NEGATIVE

## 2018-01-01 MED ORDER — SODIUM CHLORIDE 0.9% FLUSH
3.0000 mL | INTRAVENOUS | Status: DC | PRN
  Administered 2018-01-01: 3 mL
  Filled 2018-01-01: qty 10

## 2018-01-01 MED ORDER — SODIUM CHLORIDE 0.9 % IV SOLN
375.0000 mg/m2 | Freq: Once | INTRAVENOUS | Status: AC
  Administered 2018-01-01: 700 mg via INTRAVENOUS
  Filled 2018-01-01: qty 50

## 2018-01-01 MED ORDER — SODIUM CHLORIDE 0.9 % IV SOLN
750.0000 mg/m2 | Freq: Once | INTRAVENOUS | Status: AC
  Administered 2018-01-01: 1380 mg via INTRAVENOUS
  Filled 2018-01-01: qty 69

## 2018-01-01 MED ORDER — PREDNISONE 20 MG PO TABS: 60.0000 mg | ORAL_TABLET | Freq: Every day | ORAL | 0 refills | Status: AC

## 2018-01-01 MED ORDER — ACETAMINOPHEN 325 MG PO TABS
650.0000 mg | ORAL_TABLET | Freq: Once | ORAL | Status: AC
  Administered 2018-01-01: 650 mg via ORAL

## 2018-01-01 MED ORDER — DIPHENHYDRAMINE HCL 25 MG PO CAPS
ORAL_CAPSULE | ORAL | Status: AC
  Filled 2018-01-01: qty 2

## 2018-01-01 MED ORDER — DIPHENHYDRAMINE HCL 25 MG PO CAPS
50.0000 mg | ORAL_CAPSULE | Freq: Once | ORAL | Status: AC
  Administered 2018-01-01: 50 mg via ORAL

## 2018-01-01 MED ORDER — PALONOSETRON HCL INJECTION 0.25 MG/5ML
0.2500 mg | Freq: Once | INTRAVENOUS | Status: AC
  Administered 2018-01-01: 0.25 mg via INTRAVENOUS

## 2018-01-01 MED ORDER — PALONOSETRON HCL INJECTION 0.25 MG/5ML
INTRAVENOUS | Status: AC
  Filled 2018-01-01: qty 5

## 2018-01-01 MED ORDER — HEPARIN SOD (PORK) LOCK FLUSH 100 UNIT/ML IV SOLN
250.0000 [IU] | Freq: Once | INTRAVENOUS | Status: AC | PRN
  Administered 2018-01-01: 250 [IU]
  Filled 2018-01-01: qty 5

## 2018-01-01 MED ORDER — VINCRISTINE SULFATE CHEMO INJECTION 1 MG/ML
1.0000 mg | Freq: Once | INTRAVENOUS | Status: AC
  Administered 2018-01-01: 1 mg via INTRAVENOUS
  Filled 2018-01-01: qty 1

## 2018-01-01 MED ORDER — SODIUM CHLORIDE 0.9 % IV SOLN
Freq: Once | INTRAVENOUS | Status: AC
  Administered 2018-01-01: 10:00:00 via INTRAVENOUS
  Filled 2018-01-01: qty 250

## 2018-01-01 MED ORDER — DEXAMETHASONE SODIUM PHOSPHATE 10 MG/ML IJ SOLN
10.0000 mg | Freq: Once | INTRAMUSCULAR | Status: AC
  Administered 2018-01-01: 10 mg via INTRAVENOUS

## 2018-01-01 MED ORDER — DEXAMETHASONE SODIUM PHOSPHATE 10 MG/ML IJ SOLN
INTRAMUSCULAR | Status: AC
  Filled 2018-01-01: qty 1

## 2018-01-01 MED ORDER — ACETAMINOPHEN 325 MG PO TABS
ORAL_TABLET | ORAL | Status: AC
  Filled 2018-01-01: qty 2

## 2018-01-01 MED ORDER — DOXORUBICIN HCL CHEMO IV INJECTION 2 MG/ML
50.0000 mg/m2 | Freq: Once | INTRAVENOUS | Status: AC
  Administered 2018-01-01: 92 mg via INTRAVENOUS
  Filled 2018-01-01: qty 46

## 2018-01-01 NOTE — Patient Instructions (Addendum)
[facility] Discharge Instructions for Patients Receiving Chemotherapy  Today you received the following chemotherapy agents: Rituximab (Rituxan), Doxorubicin (Adriamycin), Vincristine (Oncovin), and Cyclophosphamide (Cytoxan)  To help prevent nausea and vomiting after your treatment, we encourage you to take your nausea medication as prescribed. Received Aloxi during treatment today-->Take Compazine (not Zofran) for the next 3 days as needed.    If you develop nausea and vomiting that is not controlled by your nausea medication, call the clinic.   BELOW ARE SYMPTOMS THAT SHOULD BE REPORTED IMMEDIATELY:  *FEVER GREATER THAN 100.5 F  *CHILLS WITH OR WITHOUT FEVER  NAUSEA AND VOMITING THAT IS NOT CONTROLLED WITH YOUR NAUSEA MEDICATION  *UNUSUAL SHORTNESS OF BREATH  *UNUSUAL BRUISING OR BLEEDING  TENDERNESS IN MOUTH AND THROAT WITH OR WITHOUT PRESENCE OF ULCERS  *URINARY PROBLEMS  *BOWEL PROBLEMS  UNUSUAL RASH Items with * indicate a potential emergency and should be followed up as soon as possible.  Feel free to call the clinic should you have any questions or concerns. The clinic phone number is [phone].  Please show the [facility] at check-in to the Emergency Department and triage nurse.  Rituximab injection What is this medicine? RITUXIMAB (ri TUX i mab) is a monoclonal antibody. It is used to treat certain types of cancer like non-Hodgkin lymphoma and chronic lymphocytic leukemia. It is also used to treat rheumatoid arthritis, granulomatosis with polyangiitis (or Wegener's granulomatosis), and microscopic polyangiitis. This medicine may be used for other purposes; ask your health care provider or pharmacist if you have questions. COMMON BRAND NAME(S): Rituxan What should I tell my health care provider before I take this medicine? They need to know if you have any of these conditions: -heart disease -infection (especially a virus infection such as  hepatitis B, chickenpox, cold sores, or herpes) -immune system problems -irregular heartbeat -kidney disease -lung or breathing disease, like asthma -recently received or scheduled to receive a vaccine -an unusual or allergic reaction to rituximab, mouse proteins, other medicines, foods, dyes, or preservatives -pregnant or trying to get pregnant -breast-feeding How should I use this medicine? This medicine is for infusion into a vein. It is administered in a hospital or clinic by a specially trained health care professional. A special MedGuide will be given to you by the pharmacist with each prescription and refill. Be sure to read this information carefully each time. Talk to your pediatrician regarding the use of this medicine in children. This medicine is not approved for use in children. Overdosage: If you think you have taken too much of this medicine contact a poison control center or emergency room at once. NOTE: This medicine is only for you. Do not share this medicine with others. What if I miss a dose? It is important not to miss a dose. Call your doctor or health care professional if you are unable to keep an appointment. What may interact with this medicine? -cisplatin -other medicines for arthritis like disease modifying antirheumatic drugs or tumor necrosis factor inhibitors -live virus vaccines This list may not describe all possible interactions. Give your health care provider a list of all the medicines, herbs, non-prescription drugs, or dietary supplements you use. Also tell them if you smoke, drink alcohol, or use illegal drugs. Some items may interact with your medicine. What should I watch for while using this medicine? Your condition will be monitored carefully while you are receiving this medicine. You may need blood work done while you are taking this medicine. This  medicine can cause serious allergic reactions. To reduce your risk you may need to take medicine before  treatment with this medicine. Take your medicine as directed. In some patients, this medicine may cause a serious brain infection that may cause death. If you have any problems seeing, thinking, speaking, walking, or standing, tell your doctor right away. If you cannot reach your doctor, urgently seek other source of medical care. Call your doctor or health care professional for advice if you get a fever, chills or sore throat, or other symptoms of a cold or flu. Do not treat yourself. This drug decreases your body's ability to fight infections. Try to avoid being around people who are sick. Do not become pregnant while taking this medicine or for 12 months after stopping it. Women should inform their doctor if they wish to become pregnant or think they might be pregnant. There is a potential for serious side effects to an unborn child. Talk to your health care professional or pharmacist for more information. What side effects may I notice from receiving this medicine? Side effects that you should report to your doctor or health care professional as soon as possible: -breathing problems -chest pain -dizziness or feeling faint -fast, irregular heartbeat -low blood counts - this medicine may decrease the number of white blood cells, red blood cells and platelets. You may be at increased risk for infections and bleeding. -mouth sores -redness, blistering, peeling or loosening of the skin, including inside the mouth (this can be added for any serious or exfoliative rash that could lead to hospitalization) -signs of infection - fever or chills, cough, sore throat, pain or difficulty passing urine -signs and symptoms of kidney injury like trouble passing urine or change in the amount of urine -signs and symptoms of liver injury like dark yellow or brown urine; general ill feeling or flu-like symptoms; light-colored stools; loss of appetite; nausea; right upper belly pain; unusually weak or tired; yellowing  of the eyes or skin -stomach pain -vomiting Side effects that usually do not require medical attention (report to your doctor or health care professional if they continue or are bothersome): -headache -joint pain -muscle cramps or muscle pain This list may not describe all possible side effects. Call your doctor for medical advice about side effects. You may report side effects to FDA at 1-800-FDA-1088. Where should I keep my medicine? This drug is given in a hospital or clinic and will not be stored at home. NOTE: This sheet is a summary. It may not cover all possible information. If you have questions about this medicine, talk to your doctor, pharmacist, or health care provider.  2018 Elsevier/Gold Standard (2015-12-06 15:28:09)  Doxorubicin injection What is this medicine? DOXORUBICIN (dox oh ROO bi sin) is a chemotherapy drug. It is used to treat many kinds of cancer like leukemia, lymphoma, neuroblastoma, sarcoma, and Wilms' tumor. It is also used to treat bladder cancer, breast cancer, lung cancer, ovarian cancer, stomach cancer, and thyroid cancer. This medicine may be used for other purposes; ask your health care provider or pharmacist if you have questions. COMMON BRAND NAME(S): Adriamycin, Adriamycin PFS, Adriamycin RDF, Rubex What should I tell my health care provider before I take this medicine? They need to know if you have any of these conditions: -heart disease -history of low blood counts caused by a medicine -liver disease -recent or ongoing radiation therapy -an unusual or allergic reaction to doxorubicin, other chemotherapy agents, other medicines, foods, dyes, or preservatives -pregnant or  trying to get pregnant -breast-feeding How should I use this medicine? This drug is given as an infusion into a vein. It is administered in a hospital or clinic by a specially trained health care professional. If you have pain, swelling, burning or any unusual feeling around the  site of your injection, tell your health care professional right away. Talk to your pediatrician regarding the use of this medicine in children. Special care may be needed. Overdosage: If you think you have taken too much of this medicine contact a poison control center or emergency room at once. NOTE: This medicine is only for you. Do not share this medicine with others. What if I miss a dose? It is important not to miss your dose. Call your doctor or health care professional if you are unable to keep an appointment. What may interact with this medicine? This medicine may interact with the following medications: -6-mercaptopurine -paclitaxel -phenytoin -St. John's Wort -trastuzumab -verapamil This list may not describe all possible interactions. Give your health care provider a list of all the medicines, herbs, non-prescription drugs, or dietary supplements you use. Also tell them if you smoke, drink alcohol, or use illegal drugs. Some items may interact with your medicine. What should I watch for while using this medicine? This drug may make you feel generally unwell. This is not uncommon, as chemotherapy can affect healthy cells as well as cancer cells. Report any side effects. Continue your course of treatment even though you feel ill unless your doctor tells you to stop. There is a maximum amount of this medicine you should receive throughout your life. The amount depends on the medical condition being treated and your overall health. Your doctor will watch how much of this medicine you receive in your lifetime. Tell your doctor if you have taken this medicine before. You may need blood work done while you are taking this medicine. Your urine may turn red for a few days after your dose. This is not blood. If your urine is dark or brown, call your doctor. In some cases, you may be given additional medicines to help with side effects. Follow all directions for their use. Call your doctor or  health care professional for advice if you get a fever, chills or sore throat, or other symptoms of a cold or flu. Do not treat yourself. This drug decreases your body's ability to fight infections. Try to avoid being around people who are sick. This medicine may increase your risk to bruise or bleed. Call your doctor or health care professional if you notice any unusual bleeding. Talk to your doctor about your risk of cancer. You may be more at risk for certain types of cancers if you take this medicine. Do not become pregnant while taking this medicine or for 6 months after stopping it. Women should inform their doctor if they wish to become pregnant or think they might be pregnant. Men should not father a child while taking this medicine and for 6 months after stopping it. There is a potential for serious side effects to an unborn child. Talk to your health care professional or pharmacist for more information. Do not breast-feed an infant while taking this medicine. This medicine has caused ovarian failure in some women and reduced sperm counts in some men This medicine may interfere with the ability to have a child. Talk with your doctor or health care professional if you are concerned about your fertility. What side effects may I notice from receiving  this medicine? Side effects that you should report to your doctor or health care professional as soon as possible: -allergic reactions like skin rash, itching or hives, swelling of the face, lips, or tongue -breathing problems -chest pain -fast or irregular heartbeat -low blood counts - this medicine may decrease the number of white blood cells, red blood cells and platelets. You may be at increased risk for infections and bleeding. -pain, redness, or irritation at site where injected -signs of infection - fever or chills, cough, sore throat, pain or difficulty passing urine -signs of decreased platelets or bleeding - bruising, pinpoint red spots on  the skin, black, tarry stools, blood in the urine -swelling of the ankles, feet, hands -tiredness -weakness Side effects that usually do not require medical attention (report to your doctor or health care professional if they continue or are bothersome): -diarrhea -hair loss -mouth sores -nail discoloration or damage -nausea -red colored urine -vomiting This list may not describe all possible side effects. Call your doctor for medical advice about side effects. You may report side effects to FDA at 1-800-FDA-1088. Where should I keep my medicine? This drug is given in a hospital or clinic and will not be stored at home. NOTE: This sheet is a summary. It may not cover all possible information. If you have questions about this medicine, talk to your doctor, pharmacist, or health care provider.  2018 Elsevier/Gold Standard (2015-06-26 11:28:51)  Vincristine injection What is this medicine? VINCRISTINE (vin KRIS teen) is a chemotherapy drug. It slows the growth of cancer cells. This medicine is used to treat many types of cancer like Hodgkin's disease, leukemia, non-Hodgkin's lymphoma, neuroblastoma (brain cancer), rhabdomyosarcoma, and Wilms' tumor. This medicine may be used for other purposes; ask your health care provider or pharmacist if you have questions. COMMON BRAND NAME(S): Oncovin, Vincasar PFS What should I tell my health care provider before I take this medicine? They need to know if you have any of these conditions: -blood disorders -gout -infection (especially chickenpox, cold sores, or herpes) -kidney disease -liver disease -lung disease -nervous system disease like Charcot-Marie-Tooth (CMT) -recent or ongoing radiation therapy -an unusual or allergic reaction to vincristine, other chemotherapy agents, other medicines, foods, dyes, or preservatives -pregnant or trying to get pregnant -breast-feeding How should I use this medicine? This drug is given as an infusion  into a vein. It is administered in a hospital or clinic by a specially trained health care professional. If you have pain, swelling, burning, or any unusual feeling around the site of your injection, tell your health care professional right away. Talk to your pediatrician regarding the use of this medicine in children. While this drug may be prescribed for selected conditions, precautions do apply. Overdosage: If you think you have taken too much of this medicine contact a poison control center or emergency room at once. NOTE: This medicine is only for you. Do not share this medicine with others. What if I miss a dose? It is important not to miss your dose. Call your doctor or health care professional if you are unable to keep an appointment. What may interact with this medicine? Do not take this medicine with any of the following medications: -itraconazole -mibefradil -voriconazole This medicine may also interact with the following medications: -cyclosporine -erythromycin -fluconazole -ketoconazole -medicines for HIV like delavirdine, efavirenz, nevirapine -medicines for seizures like ethotoin, fosphenotoin, phenytoin -medicines to increase blood counts like filgrastim, pegfilgrastim, sargramostim -other chemotherapy drugs like cisplatin, L-asparaginase, methotrexate, mitomycin, paclitaxel -pegaspargase -vaccines -  zalcitabine, ddC Talk to your doctor or health care professional before taking any of these medicines: -acetaminophen -aspirin -ibuprofen -ketoprofen -naproxen This list may not describe all possible interactions. Give your health care provider a list of all the medicines, herbs, non-prescription drugs, or dietary supplements you use. Also tell them if you smoke, drink alcohol, or use illegal drugs. Some items may interact with your medicine. What should I watch for while using this medicine? Your condition will be monitored carefully while you are receiving this medicine.  You will need important blood work done while you are taking this medicine. This drug may make you feel generally unwell. This is not uncommon, as chemotherapy can affect healthy cells as well as cancer cells. Report any side effects. Continue your course of treatment even though you feel ill unless your doctor tells you to stop. In some cases, you may be given additional medicines to help with side effects. Follow all directions for their use. Call your doctor or health care professional for advice if you get a fever, chills or sore throat, or other symptoms of a cold or flu. Do not treat yourself. Avoid taking products that contain aspirin, acetaminophen, ibuprofen, naproxen, or ketoprofen unless instructed by your doctor. These medicines may hide a fever. Do not become pregnant while taking this medicine. Women should inform their doctor if they wish to become pregnant or think they might be pregnant. There is a potential for serious side effects to an unborn child. Talk to your health care professional or pharmacist for more information. Do not breast-feed an infant while taking this medicine. Men may have a lower sperm count while taking this medicine. Talk to your doctor if you plan to father a child. What side effects may I notice from receiving this medicine? Side effects that you should report to your doctor or health care professional as soon as possible: -allergic reactions like skin rash, itching or hives, swelling of the face, lips, or tongue -breathing problems -confusion or changes in emotions or moods -constipation -cough -mouth sores -muscle weakness -nausea and vomiting -pain, swelling, redness or irritation at the injection site -pain, tingling, numbness in the hands or feet -problems with balance, talking, walking -seizures -stomach pain -trouble passing urine or change in the amount of urine Side effects that usually do not require medical attention (report to your doctor  or health care professional if they continue or are bothersome): -diarrhea -hair loss -jaw pain -loss of appetite This list may not describe all possible side effects. Call your doctor for medical advice about side effects. You may report side effects to FDA at 1-800-FDA-1088. Where should I keep my medicine? This drug is given in a hospital or clinic and will not be stored at home. NOTE: This sheet is a summary. It may not cover all possible information. If you have questions about this medicine, talk to your doctor, pharmacist, or health care provider.  2018 Elsevier/Gold Standard (2008-01-25 17:17:13)  Cyclophosphamide injection What is this medicine? CYCLOPHOSPHAMIDE (sye kloe FOSS fa mide) is a chemotherapy drug. It slows the growth of cancer cells. This medicine is used to treat many types of cancer like lymphoma, myeloma, leukemia, breast cancer, and ovarian cancer, to name a few. This medicine may be used for other purposes; ask your health care provider or pharmacist if you have questions. COMMON BRAND NAME(S): Cytoxan, Neosar What should I tell my health care provider before I take this medicine? They need to know if you have any  of these conditions: -blood disorders -history of other chemotherapy -infection -kidney disease -liver disease -recent or ongoing radiation therapy -tumors in the bone marrow -an unusual or allergic reaction to cyclophosphamide, other chemotherapy, other medicines, foods, dyes, or preservatives -pregnant or trying to get pregnant -breast-feeding How should I use this medicine? This drug is usually given as an injection into a vein or muscle or by infusion into a vein. It is administered in a hospital or clinic by a specially trained health care professional. Talk to your pediatrician regarding the use of this medicine in children. Special care may be needed. Overdosage: If you think you have taken too much of this medicine contact a poison control  center or emergency room at once. NOTE: This medicine is only for you. Do not share this medicine with others. What if I miss a dose? It is important not to miss your dose. Call your doctor or health care professional if you are unable to keep an appointment. What may interact with this medicine? This medicine may interact with the following medications: -amiodarone -amphotericin B -azathioprine -certain antiviral medicines for HIV or AIDS such as protease inhibitors (e.g., indinavir, ritonavir) and zidovudine -certain blood pressure medications such as benazepril, captopril, enalapril, fosinopril, lisinopril, moexipril, monopril, perindopril, quinapril, ramipril, trandolapril -certain cancer medications such as anthracyclines (e.g., daunorubicin, doxorubicin), busulfan, cytarabine, paclitaxel, pentostatin, tamoxifen, trastuzumab -certain diuretics such as chlorothiazide, chlorthalidone, hydrochlorothiazide, indapamide, metolazone -certain medicines that treat or prevent blood clots like warfarin -certain muscle relaxants such as succinylcholine -cyclosporine -etanercept -indomethacin -medicines to increase blood counts like filgrastim, pegfilgrastim, sargramostim -medicines used as general anesthesia -metronidazole -natalizumab This list may not describe all possible interactions. Give your health care provider a list of all the medicines, herbs, non-prescription drugs, or dietary supplements you use. Also tell them if you smoke, drink alcohol, or use illegal drugs. Some items may interact with your medicine. What should I watch for while using this medicine? Visit your doctor for checks on your progress. This drug may make you feel generally unwell. This is not uncommon, as chemotherapy can affect healthy cells as well as cancer cells. Report any side effects. Continue your course of treatment even though you feel ill unless your doctor tells you to stop. Drink water or other fluids as  directed. Urinate often, even at night. In some cases, you may be given additional medicines to help with side effects. Follow all directions for their use. Call your doctor or health care professional for advice if you get a fever, chills or sore throat, or other symptoms of a cold or flu. Do not treat yourself. This drug decreases your body's ability to fight infections. Try to avoid being around people who are sick. This medicine may increase your risk to bruise or bleed. Call your doctor or health care professional if you notice any unusual bleeding. Be careful brushing and flossing your teeth or using a toothpick because you may get an infection or bleed more easily. If you have any dental work done, tell your dentist you are receiving this medicine. You may get drowsy or dizzy. Do not drive, use machinery, or do anything that needs mental alertness until you know how this medicine affects you. Do not become pregnant while taking this medicine or for 1 year after stopping it. Women should inform their doctor if they wish to become pregnant or think they might be pregnant. Men should not father a child while taking this medicine and for 4 months after stopping  it. There is a potential for serious side effects to an unborn child. Talk to your health care professional or pharmacist for more information. Do not breast-feed an infant while taking this medicine. This medicine may interfere with the ability to have a child. This medicine has caused ovarian failure in some women. This medicine has caused reduced sperm counts in some men. You should talk with your doctor or health care professional if you are concerned about your fertility. If you are going to have surgery, tell your doctor or health care professional that you have taken this medicine. What side effects may I notice from receiving this medicine? Side effects that you should report to your doctor or health care professional as soon as  possible: -allergic reactions like skin rash, itching or hives, swelling of the face, lips, or tongue -low blood counts - this medicine may decrease the number of white blood cells, red blood cells and platelets. You may be at increased risk for infections and bleeding. -signs of infection - fever or chills, cough, sore throat, pain or difficulty passing urine -signs of decreased platelets or bleeding - bruising, pinpoint red spots on the skin, black, tarry stools, blood in the urine -signs of decreased red blood cells - unusually weak or tired, fainting spells, lightheadedness -breathing problems -dark urine -dizziness -palpitations -swelling of the ankles, feet, hands -trouble passing urine or change in the amount of urine -weight gain -yellowing of the eyes or skin Side effects that usually do not require medical attention (report to your doctor or health care professional if they continue or are bothersome): -changes in nail or skin color -hair loss -missed menstrual periods -mouth sores -nausea, vomiting This list may not describe all possible side effects. Call your doctor for medical advice about side effects. You may report side effects to FDA at 1-800-FDA-1088. Where should I keep my medicine? This drug is given in a hospital or clinic and will not be stored at home. NOTE: This sheet is a summary. It may not cover all possible information. If you have questions about this medicine, talk to your doctor, pharmacist, or health care provider.  2018 Elsevier/Gold Standard (2012-03-13 16:22:58)

## 2018-01-01 NOTE — Telephone Encounter (Signed)
Scheduled appt per 8/21 los - gave patient AVS and calender per los.  

## 2018-01-01 NOTE — Progress Notes (Signed)
12-31-17 (Epic) CMP, CBC w/Diff  12-30-17 (Epic) Cardiac Clearance from Dr. Percival Spanish in Telephone Encounter  12-08-17 (Epic) EKG, CXR  06-26-17 (Epic) STRESS

## 2018-01-01 NOTE — Patient Instructions (Addendum)
David Little  01/01/2018   Your procedure is scheduled on: 01-09-18   Report to [facility] Main  Entrance    Report to Admitting at 10:00 AM    Call this number if you have problems the morning of surgery [phone]   Remember: Do not eat food or drink liquids :After Midnight.     Take these medicines the morning of surgery with A SIP OF WATER:  Metoprolol Tartrate (Lopressor).  You may bring and use your eyedrops as needed                                You may not have any metal on your body including hair pins and              piercings  Do not wear jewelry, lotions, powders, cologne or deodorant             Men may shave face and neck.   Do not bring valuables to the hospital. Van.  Contacts, dentures or bridgework may not be worn into surgery.     Patients discharged the day of surgery will not be allowed to drive home.  Name and phone number of your driver: David Little [phone]                Please read over the following fact sheets you were given: _____________________________________________________________________             [facility] - Preparing for Surgery Before surgery, you can play an important role.  Because skin is not sterile, your skin needs to be as free of germs as possible.  You can reduce the number of germs on your skin by washing with CHG (chlorahexidine gluconate) soap before surgery.  CHG is an antiseptic cleaner which kills germs and bonds with the skin to continue killing germs even after washing. Please DO NOT use if you have an allergy to CHG or antibacterial soaps.  If your skin becomes reddened/irritated stop using the CHG and inform your nurse when you arrive at Short Stay. Do not shave (including legs and underarms) for at least 48 hours prior to the first CHG shower.  You may shave your face/neck. Please follow these instructions carefully:  1.   Shower with CHG Soap the night before surgery and the  morning of Surgery.  2.  If you choose to wash your hair, wash your hair first as usual with your  normal  shampoo.  3.  After you shampoo, rinse your hair and body thoroughly to remove the  shampoo.                           4.  Use CHG as you would any other liquid soap.  You can apply chg directly  to the skin and wash                       Gently with a scrungie or clean washcloth.  5.  Apply the CHG Soap to your body ONLY FROM THE NECK DOWN.   Do not use on face/ open  Wound or open sores. Avoid contact with eyes, ears mouth and genitals (private parts).                       Wash face,  Genitals (private parts) with your normal soap.             6.  Wash thoroughly, paying special attention to the area where your surgery  will be performed.  7.  Thoroughly rinse your body with warm water from the neck down.  8.  DO NOT shower/wash with your normal soap after using and rinsing off  the CHG Soap.                9.  Pat yourself dry with a clean towel.            10.  Wear clean pajamas.            11.  Place clean sheets on your bed the night of your first shower and do not  sleep with pets. Day of Surgery : Do not apply any lotions/deodorants the morning of surgery.  Please wear clean clothes to the hospital/surgery center.  FAILURE TO FOLLOW THESE INSTRUCTIONS MAY RESULT IN THE CANCELLATION OF YOUR SURGERY PATIENT SIGNATURE_________________________________  NURSE SIGNATURE__________________________________  ________________________________________________________________________

## 2018-01-01 NOTE — Telephone Encounter (Signed)
   Primary Cardiologist: Dr. Percival Spanish  Chart reviewed as part of pre-operative protocol coverage. Pre-op clearance already addressed by colleagues in earlier phone notes. In addition, his pending echo was completed yesterday and I have personally reviewed study report. Echo is ok. Normal EF and no significant valvular abnormalities. To summarize recommendations:  -Pt is cleared for procedure. Dr. Percival Spanish feels that he is at acceptable risk. See comments below.   Will route this bundled recommendation to requesting provider via Epic fax function. Please call with questions.  Lyda Jester, PA-C 01/01/2018, 1:58 PM

## 2018-01-02 ENCOUNTER — Encounter (HOSPITAL_COMMUNITY): Payer: Self-pay

## 2018-01-02 ENCOUNTER — Inpatient Hospital Stay: Payer: Medicare Other

## 2018-01-02 ENCOUNTER — Encounter (HOSPITAL_COMMUNITY)
Admission: RE | Admit: 2018-01-02 | Discharge: 2018-01-02 | Disposition: A | Discharge status: Home / Self Care | Payer: Medicare Other | Source: Ambulatory Visit | Attending: Urology | Admitting: Urology

## 2018-01-02 ENCOUNTER — Other Ambulatory Visit: Payer: Self-pay | Admitting: Physician Assistant

## 2018-01-02 ENCOUNTER — Other Ambulatory Visit: Payer: Self-pay

## 2018-01-02 ENCOUNTER — Encounter: Payer: Self-pay | Admitting: Physician Assistant

## 2018-01-02 VITALS — BP 153/81 | HR 71 | Temp 97.8°F | Resp 16

## 2018-01-02 DIAGNOSIS — R109 Unspecified abdominal pain: Secondary | ICD-10-CM | POA: Diagnosis not present

## 2018-01-02 DIAGNOSIS — Z5189 Encounter for other specified aftercare: Secondary | ICD-10-CM | POA: Diagnosis not present

## 2018-01-02 DIAGNOSIS — R627 Adult failure to thrive: Secondary | ICD-10-CM | POA: Diagnosis not present

## 2018-01-02 DIAGNOSIS — C8590 Non-Hodgkin lymphoma, unspecified, unspecified site: Secondary | ICD-10-CM | POA: Diagnosis not present

## 2018-01-02 DIAGNOSIS — Z7982 Long term (current) use of aspirin: Secondary | ICD-10-CM | POA: Diagnosis not present

## 2018-01-02 DIAGNOSIS — Z951 Presence of aortocoronary bypass graft: Secondary | ICD-10-CM | POA: Insufficient documentation

## 2018-01-02 DIAGNOSIS — Z8572 Personal history of non-Hodgkin lymphomas: Secondary | ICD-10-CM | POA: Diagnosis not present

## 2018-01-02 DIAGNOSIS — I252 Old myocardial infarction: Secondary | ICD-10-CM | POA: Insufficient documentation

## 2018-01-02 DIAGNOSIS — Z5111 Encounter for antineoplastic chemotherapy: Secondary | ICD-10-CM | POA: Diagnosis not present

## 2018-01-02 DIAGNOSIS — R1909 Other intra-abdominal and pelvic swelling, mass and lump: Secondary | ICD-10-CM | POA: Diagnosis not present

## 2018-01-02 DIAGNOSIS — Z9889 Other specified postprocedural states: Secondary | ICD-10-CM | POA: Insufficient documentation

## 2018-01-02 DIAGNOSIS — R112 Nausea with vomiting, unspecified: Secondary | ICD-10-CM | POA: Diagnosis not present

## 2018-01-02 DIAGNOSIS — B370 Candidal stomatitis: Secondary | ICD-10-CM | POA: Diagnosis not present

## 2018-01-02 DIAGNOSIS — N1330 Unspecified hydronephrosis: Secondary | ICD-10-CM | POA: Diagnosis not present

## 2018-01-02 DIAGNOSIS — D701 Agranulocytosis secondary to cancer chemotherapy: Secondary | ICD-10-CM | POA: Diagnosis not present

## 2018-01-02 DIAGNOSIS — Z79899 Other long term (current) drug therapy: Secondary | ICD-10-CM | POA: Diagnosis not present

## 2018-01-02 DIAGNOSIS — I2510 Atherosclerotic heart disease of native coronary artery without angina pectoris: Secondary | ICD-10-CM | POA: Insufficient documentation

## 2018-01-02 DIAGNOSIS — Z85828 Personal history of other malignant neoplasm of skin: Secondary | ICD-10-CM | POA: Diagnosis not present

## 2018-01-02 DIAGNOSIS — K5900 Constipation, unspecified: Secondary | ICD-10-CM | POA: Diagnosis not present

## 2018-01-02 DIAGNOSIS — C8330 Diffuse large B-cell lymphoma, unspecified site: Secondary | ICD-10-CM | POA: Diagnosis not present

## 2018-01-02 DIAGNOSIS — E7800 Pure hypercholesterolemia, unspecified: Secondary | ICD-10-CM | POA: Diagnosis not present

## 2018-01-02 DIAGNOSIS — Z5112 Encounter for antineoplastic immunotherapy: Secondary | ICD-10-CM | POA: Diagnosis not present

## 2018-01-02 DIAGNOSIS — Z96652 Presence of left artificial knee joint: Secondary | ICD-10-CM | POA: Diagnosis not present

## 2018-01-02 LAB — BASIC METABOLIC PANEL
Anion gap: 7 (ref 5–15)
BUN: 21 mg/dL (ref 8–23)
CALCIUM: 10 mg/dL (ref 8.9–10.3)
CO2: 25 mmol/L (ref 22–32)
CREATININE: 0.89 mg/dL (ref 0.61–1.24)
Chloride: 111 mmol/L (ref 98–111)
GFR calc Af Amer: >60 mL/min (ref >60)
GFR calc non Af Amer: >60 mL/min (ref >60)
GLUCOSE: 167 mg/dL — AB (ref 70–99)
Potassium: 4.1 mmol/L (ref 3.5–5.1)
Sodium: 143 mmol/L (ref 135–145)

## 2018-01-02 LAB — CBC
HCT: 33.1 % — ABNORMAL LOW (ref 39.0–52.0)
HEMOGLOBIN: 10.7 g/dL — AB (ref 13.0–17.0)
MCH: 27.2 pg (ref 26.0–34.0)
MCHC: 32.3 g/dL (ref 30.0–36.0)
MCV: 84 fL (ref 78.0–100.0)
Platelets: 267 10*3/uL (ref 150–400)
RBC: 3.94 MIL/uL — ABNORMAL LOW (ref 4.22–5.81)
RDW: 14.2 % (ref 11.5–15.5)
WBC: 13.7 10*3/uL — ABNORMAL HIGH (ref 4.0–10.5)

## 2018-01-02 MED ORDER — PEGFILGRASTIM-CBQV 6 MG/0.6ML ~~LOC~~ SOSY
6.0000 mg | PREFILLED_SYRINGE | Freq: Once | SUBCUTANEOUS | Status: AC
  Administered 2018-01-02: 6 mg via SUBCUTANEOUS

## 2018-01-02 MED ORDER — PEGFILGRASTIM-CBQV 6 MG/0.6ML ~~LOC~~ SOSY
PREFILLED_SYRINGE | SUBCUTANEOUS | Status: AC
  Filled 2018-01-02: qty 0.6

## 2018-01-02 NOTE — Patient Instructions (Signed)
Pegfilgrastim injection What is this medicine? PEGFILGRASTIM (PEG fil gra stim) is a long-acting granulocyte colony-stimulating factor that stimulates the growth of neutrophils, a type of white blood cell important in the body's fight against infection. It is used to reduce the incidence of fever and infection in patients with certain types of cancer who are receiving chemotherapy that affects the bone marrow, and to increase survival after being exposed to high doses of radiation. This medicine may be used for other purposes; ask your health care provider or pharmacist if you have questions. COMMON BRAND NAME(S): Neulasta What should I tell my health care provider before I take this medicine? They need to know if you have any of these conditions: -kidney disease -latex allergy -ongoing radiation therapy -sickle cell disease -skin reactions to acrylic adhesives (On-Body Injector only) -an unusual or allergic reaction to pegfilgrastim, filgrastim, other medicines, foods, dyes, or preservatives -pregnant or trying to get pregnant -breast-feeding How should I use this medicine? This medicine is for injection under the skin. If you get this medicine at home, you will be taught how to prepare and give the pre-filled syringe or how to use the On-body Injector. Refer to the patient Instructions for Use for detailed instructions. Use exactly as directed. Tell your healthcare provider immediately if you suspect that the On-body Injector may not have performed as intended or if you suspect the use of the On-body Injector resulted in a missed or partial dose. It is important that you put your used needles and syringes in a special sharps container. Do not put them in a trash can. If you do not have a sharps container, call your pharmacist or healthcare provider to get one. Talk to your pediatrician regarding the use of this medicine in children. While this drug may be prescribed for selected conditions,  precautions do apply. Overdosage: If you think you have taken too much of this medicine contact a poison control center or emergency room at once. NOTE: This medicine is only for you. Do not share this medicine with others. What if I miss a dose? It is important not to miss your dose. Call your doctor or health care professional if you miss your dose. If you miss a dose due to an On-body Injector failure or leakage, a new dose should be administered as soon as possible using a single prefilled syringe for manual use. What may interact with this medicine? Interactions have not been studied. Give your health care provider a list of all the medicines, herbs, non-prescription drugs, or dietary supplements you use. Also tell them if you smoke, drink alcohol, or use illegal drugs. Some items may interact with your medicine. This list may not describe all possible interactions. Give your health care provider a list of all the medicines, herbs, non-prescription drugs, or dietary supplements you use. Also tell them if you smoke, drink alcohol, or use illegal drugs. Some items may interact with your medicine. What should I watch for while using this medicine? You may need blood work done while you are taking this medicine. If you are going to need a MRI, CT scan, or other procedure, tell your doctor that you are using this medicine (On-Body Injector only). What side effects may I notice from receiving this medicine? Side effects that you should report to your doctor or health care professional as soon as possible: -allergic reactions like skin rash, itching or hives, swelling of the face, lips, or tongue -dizziness -fever -pain, redness, or irritation at site   where injected -pinpoint red spots on the skin -red or dark-brown urine -shortness of breath or breathing problems -stomach or side pain, or pain at the shoulder -swelling -tiredness -trouble passing urine or change in the amount of urine Side  effects that usually do not require medical attention (report to your doctor or health care professional if they continue or are bothersome): -bone pain -muscle pain This list may not describe all possible side effects. Call your doctor for medical advice about side effects. You may report side effects to FDA at 1-800-FDA-1088. Where should I keep my medicine? Keep out of the reach of children. Store pre-filled syringes in a refrigerator between 2 and 8 degrees C (36 and 46 degrees F). Do not freeze. Keep in carton to protect from light. Throw away this medicine if it is left out of the refrigerator for more than 48 hours. Throw away any unused medicine after the expiration date. NOTE: This sheet is a summary. It may not cover all possible information. If you have questions about this medicine, talk to your doctor, pharmacist, or health care provider.  2018 Elsevier/Gold Standard (2016-04-25 12:58:03)  

## 2018-01-05 ENCOUNTER — Encounter (HOSPITAL_COMMUNITY): Payer: Self-pay

## 2018-01-05 ENCOUNTER — Ambulatory Visit (HOSPITAL_COMMUNITY)
Admission: RE | Admit: 2018-01-05 | Discharge: 2018-01-05 | Disposition: A | Discharge status: Home / Self Care | Payer: Medicare Other | Source: Ambulatory Visit | Attending: Oncology | Admitting: Oncology

## 2018-01-05 ENCOUNTER — Other Ambulatory Visit: Payer: Self-pay | Admitting: Oncology

## 2018-01-05 DIAGNOSIS — Z9889 Other specified postprocedural states: Secondary | ICD-10-CM | POA: Diagnosis not present

## 2018-01-05 DIAGNOSIS — Z5111 Encounter for antineoplastic chemotherapy: Secondary | ICD-10-CM | POA: Diagnosis not present

## 2018-01-05 DIAGNOSIS — Z96652 Presence of left artificial knee joint: Secondary | ICD-10-CM | POA: Diagnosis not present

## 2018-01-05 DIAGNOSIS — Z85828 Personal history of other malignant neoplasm of skin: Secondary | ICD-10-CM | POA: Diagnosis not present

## 2018-01-05 DIAGNOSIS — Z7982 Long term (current) use of aspirin: Secondary | ICD-10-CM | POA: Diagnosis not present

## 2018-01-05 DIAGNOSIS — E7800 Pure hypercholesterolemia, unspecified: Secondary | ICD-10-CM | POA: Diagnosis not present

## 2018-01-05 DIAGNOSIS — I252 Old myocardial infarction: Secondary | ICD-10-CM | POA: Diagnosis not present

## 2018-01-05 DIAGNOSIS — C8590 Non-Hodgkin lymphoma, unspecified, unspecified site: Secondary | ICD-10-CM

## 2018-01-05 DIAGNOSIS — Z951 Presence of aortocoronary bypass graft: Secondary | ICD-10-CM | POA: Diagnosis not present

## 2018-01-05 DIAGNOSIS — Z79899 Other long term (current) drug therapy: Secondary | ICD-10-CM | POA: Diagnosis not present

## 2018-01-05 DIAGNOSIS — I2510 Atherosclerotic heart disease of native coronary artery without angina pectoris: Secondary | ICD-10-CM | POA: Diagnosis not present

## 2018-01-05 HISTORY — PX: IR IMAGING GUIDED PORT INSERTION: IMG5740

## 2018-01-05 LAB — CBC
HEMATOCRIT: 32 % — AB (ref 39.0–52.0)
Hemoglobin: 10.5 g/dL — ABNORMAL LOW (ref 13.0–17.0)
MCH: 27.5 pg (ref 26.0–34.0)
MCHC: 32.8 g/dL (ref 30.0–36.0)
MCV: 83.8 fL (ref 78.0–100.0)
Platelets: 226 10*3/uL (ref 150–400)
RBC: 3.82 MIL/uL — ABNORMAL LOW (ref 4.22–5.81)
RDW: 14.8 % (ref 11.5–15.5)
WBC: 33 10*3/uL — AB (ref 4.0–10.5)

## 2018-01-05 LAB — PROTIME-INR
INR: 1.06
Prothrombin Time: 13.7 seconds (ref 11.4–15.2)

## 2018-01-05 LAB — APTT: aPTT: 30 seconds (ref 24–36)

## 2018-01-05 MED ORDER — LIDOCAINE-EPINEPHRINE (PF) 2 %-1:200000 IJ SOLN
INTRAMUSCULAR | Status: AC
  Filled 2018-01-05: qty 20

## 2018-01-05 MED ORDER — SODIUM CHLORIDE 0.9 % IV SOLN
INTRAVENOUS | Status: DC
  Administered 2018-01-05: 13:00:00 via INTRAVENOUS

## 2018-01-05 MED ORDER — FENTANYL CITRATE (PF) 100 MCG/2ML IJ SOLN
INTRAMUSCULAR | Status: AC
  Filled 2018-01-05: qty 2

## 2018-01-05 MED ORDER — HEPARIN SOD (PORK) LOCK FLUSH 100 UNIT/ML IV SOLN
INTRAVENOUS | Status: AC
  Filled 2018-01-05: qty 5

## 2018-01-05 MED ORDER — MIDAZOLAM HCL 2 MG/2ML IJ SOLN
INTRAMUSCULAR | Status: AC
  Filled 2018-01-05: qty 4

## 2018-01-05 MED ORDER — CEFAZOLIN SODIUM-DEXTROSE 2-4 GM/100ML-% IV SOLN
2.0000 g | Freq: Once | INTRAVENOUS | Status: AC
  Administered 2018-01-05: 2 g via INTRAVENOUS

## 2018-01-05 MED ORDER — CEFAZOLIN SODIUM-DEXTROSE 2-4 GM/100ML-% IV SOLN
INTRAVENOUS | Status: AC
  Administered 2018-01-05: 2 g via INTRAVENOUS
  Filled 2018-01-05: qty 100

## 2018-01-05 MED ORDER — LIDOCAINE-EPINEPHRINE (PF) 1 %-1:200000 IJ SOLN
INTRAMUSCULAR | Status: AC | PRN
  Administered 2018-01-05: 20 mL

## 2018-01-05 MED ORDER — HEPARIN SOD (PORK) LOCK FLUSH 100 UNIT/ML IV SOLN
INTRAVENOUS | Status: AC | PRN
  Administered 2018-01-05: 500 [IU] via INTRAVENOUS

## 2018-01-05 MED ORDER — FENTANYL CITRATE (PF) 100 MCG/2ML IJ SOLN
INTRAMUSCULAR | Status: AC | PRN
  Administered 2018-01-05 (×2): 50 ug via INTRAVENOUS

## 2018-01-05 MED ORDER — MIDAZOLAM HCL 2 MG/2ML IJ SOLN
INTRAMUSCULAR | Status: AC | PRN
  Administered 2018-01-05 (×2): 1 mg via INTRAVENOUS

## 2018-01-05 NOTE — Progress Notes (Signed)
Patient ID: David Little, male   DOB: 1936/04/10, 82 y.o.   MRN: [id number] Pt's RUE PICC was removed in its entirety without immediate complications. Gauze dressing applied to site.

## 2018-01-05 NOTE — Discharge Instructions (Signed)
Implanted Port Home Guide °An implanted port is a type of central line that is placed under the skin. Central lines are used to provide IV access when treatment or nutrition needs to be given through a person’s veins. Implanted ports are used for long-term IV access. An implanted port may be placed because: °· You need IV medicine that would be irritating to the small veins in your hands or arms. °· You need long-term IV medicines, such as antibiotics. °· You need IV nutrition for a long period. °· You need frequent blood draws for lab tests. °· You need dialysis. ° °Implanted ports are usually placed in the chest area, but they can also be placed in the upper arm, the abdomen, or the leg. An implanted port has two main parts: °· Reservoir. The reservoir is round and will appear as a small, raised area under your skin. The reservoir is the part where a needle is inserted to give medicines or draw blood. °· Catheter. The catheter is a thin, flexible tube that extends from the reservoir. The catheter is placed into a large vein. Medicine that is inserted into the reservoir goes into the catheter and then into the vein. ° °How will I care for my incision site? °Do not get the incision site wet. Bathe or shower as directed by your health care provider. °How is my port accessed? °Special steps must be taken to access the port: °· Before the port is accessed, a numbing cream can be placed on the skin. This helps numb the skin over the port site. °· Your health care provider uses a sterile technique to access the port. °? Your health care provider must put on a mask and sterile gloves. °? The skin over your port is cleaned carefully with an antiseptic and allowed to dry. °? The port is gently pinched between sterile gloves, and a needle is inserted into the port. °· Only "non-coring" port needles should be used to access the port. Once the port is accessed, a blood return should be checked. This helps ensure that the port  is in the vein and is not clogged. °· If your port needs to remain accessed for a constant infusion, a clear (transparent) bandage will be placed over the needle site. The bandage and needle will need to be changed every week, or as directed by your health care provider. °· Keep the bandage covering the needle clean and dry. Do not get it wet. Follow your health care provider’s instructions on how to take a shower or bath while the port is accessed. °· If your port does not need to stay accessed, no bandage is needed over the port. ° °What is flushing? °Flushing helps keep the port from getting clogged. Follow your health care provider’s instructions on how and when to flush the port. Ports are usually flushed with saline solution or a medicine called heparin. The need for flushing will depend on how the port is used. °· If the port is used for intermittent medicines or blood draws, the port will need to be flushed: °? After medicines have been given. °? After blood has been drawn. °? As part of routine maintenance. °· If a constant infusion is running, the port may not need to be flushed. ° °How long will my port stay implanted? °The port can stay in for as long as your health care provider thinks it is needed. When it is time for the port to come out, surgery will be   done to remove it. The procedure is similar to the one performed when the port was put in. °When should I seek immediate medical care? °When you have an implanted port, you should seek immediate medical care if: °· You notice a bad smell coming from the incision site. °· You have swelling, redness, or drainage at the incision site. °· You have more swelling or pain at the port site or the surrounding area. °· You have a fever that is not controlled with medicine. ° °This information is not intended to replace advice given to you by your health care provider. Make sure you discuss any questions you have with your health care provider. °Document  Released: 04/29/2005 Document Revised: 10/05/2015 Document Reviewed: 01/04/2013 °Elsevier Interactive Patient Education © 2017 Elsevier Inc. °Moderate Conscious Sedation, Adult, Care After °These instructions provide you with information about caring for yourself after your procedure. Your health care provider may also give you more specific instructions. Your treatment has been planned according to current medical practices, but problems sometimes occur. Call your health care provider if you have any problems or questions after your procedure. °What can I expect after the procedure? °After your procedure, it is common: °· To feel sleepy for several hours. °· To feel clumsy and have poor balance for several hours. °· To have poor judgment for several hours. °· To vomit if you eat too soon. ° °Follow these instructions at home: °For at least 24 hours after the procedure: ° °· Do not: °? Participate in activities where you could fall or become injured. °? Drive. °? Use heavy machinery. °? Drink alcohol. °? Take sleeping pills or medicines that cause drowsiness. °? Make important decisions or sign legal documents. °? Take care of children on your own. °· Rest. °Eating and drinking °· Follow the diet recommended by your health care provider. °· If you vomit: °? Drink water, juice, or soup when you can drink without vomiting. °? Make sure you have little or no nausea before eating solid foods. °General instructions °· Have a responsible adult stay with you until you are awake and alert. °· Take over-the-counter and prescription medicines only as told by your health care provider. °· If you smoke, do not smoke without supervision. °· Keep all follow-up visits as told by your health care provider. This is important. °Contact a health care provider if: °· You keep feeling nauseous or you keep vomiting. °· You feel light-headed. °· You develop a rash. °· You have a fever. °Get help right away if: °· You have trouble  breathing. °This information is not intended to replace advice given to you by your health care provider. Make sure you discuss any questions you have with your health care provider. °Document Released: 02/17/2013 Document Revised: 10/02/2015 Document Reviewed: 08/19/2015 °Elsevier Interactive Patient Education © 2018 Elsevier Inc. ° °

## 2018-01-05 NOTE — Procedures (Signed)
Pre Procedure Dx: Lymphoma Post Procedural Dx: Same  Successful placement of right IJ approach port-a-cath with tip at the superior caval atrial junction. The catheter is ready for immediate use.  Estimated Blood Loss: Minimal  Complications: None immediate.  Jay Dare Sanger, MD Pager #: [phone]   

## 2018-01-05 NOTE — H&P (Signed)
Referring Physician(s): Ladell Pier  Supervising Physician: Sandi Mariscal  Patient Status:  [facility] OP  Chief Complaint:  "I'm getting a port a cath"  Subjective: Patient familiar to IR service from prior Port-A-Cath placement in 2002 with removal in 2004, retroperitoneal lymph node biopsy on 12/25/2017, and right upper extremity PICC line placement on 12/31/2017. He has a  history of large B-cell lymphoma and presents today for Port-A-Cath placement for additional chemotherapy. He denies fever,HA, CP, worsening dyspnea, cough, N/V or bleeding. He does have constipation and abd/back discomfort.   Past Medical History:  Diagnosis Date  . Coronary artery disease 2007   s/p CABG in 2007 for severe left main disease  . Hypercholesterolemia   . Hyperlipidemia   . Lymphoma ([place]) 2003   without recurrence  . Myocardial infarction ([place]) 2007  . Normal nuclear stress test March 2012   EF 55%. No ischemia. Mild inferior scar  . Skin cancer of face    Past Surgical History:  Procedure Laterality Date  . CORONARY ARTERY BYPASS GRAFT  2007   Had coronary artery bypass grafting for severe left main coronary artery stenosis--Had follow up stress cardiolite study in February 2010 which showed EF of 55% with mild inferior hypokinesia--There was no ischemia  . HERNIA REPAIR    . INGUINAL HERNIA REPAIR Left 05/31/2016   Procedure: LEFT INGUINAL HERNIA REPAIR WITH MESH;  Surgeon: Jackolyn Confer, MD;  Location: [facility];  Service: General;  Laterality: Left;  . INSERTION OF MESH Left 05/31/2016   Procedure: INSERTION OF MESH;  Surgeon: Jackolyn Confer, MD;  Location: [facility];  Service: General;  Laterality: Left;  . TOTAL KNEE ARTHROPLASTY Left 09/29/2013   Procedure: TOTAL KNEE ARTHROPLASTY;  Surgeon: Alta Corning, MD;  Location: [place];  Service: Orthopedics;  Laterality: Left;       Allergies: Patient has no known allergies.  Medications: Prior to Admission medications   Medication Sig  Start Date End Date Taking? Authorizing Provider  acetaminophen (TYLENOL) 500 MG tablet Take 1,000 mg by mouth every 6 (six) hours as needed for mild pain or moderate pain.    [provider]  allopurinol (ZYLOPRIM) 100 MG tablet Take 1 tablet (100 mg total) by mouth daily for 10 days. Start the day before chemotherapy 12/26/17 01/05/18  Ladell Pier, MD  aspirin EC 81 MG tablet Take 1 tablet (81 mg total) by mouth daily. 06/17/17   Almyra Deforest, PA  metoprolol tartrate (LOPRESSOR) 25 MG tablet Take 1 tablet (25 mg total) by mouth 2 (two) times daily. 05/07/17   Minus Breeding, MD  Multiple Vitamin (MULTIVITAMIN PO) Take 1 tablet by mouth daily.     [provider]  nitroGLYCERIN (NITROSTAT) 0.4 MG SL tablet Place 1 tablet (0.4 mg total) under the tongue as needed. 05/01/17   Barrett, Evelene Croon, PA-C  Polyethyl Glycol-Propyl Glycol (SYSTANE OP) Apply 1 drop to eye daily as needed (dry eyes).    [provider]  predniSONE (DELTASONE) 20 MG tablet Take 3 tablets (60 mg total) by mouth daily with breakfast for 4 days. Start the day after chemo. 01/02/18 01/06/18  Ladell Pier, MD  prochlorperazine (COMPAZINE) 10 MG tablet Take 1 tablet (10 mg total) by mouth every 6 (six) hours as needed for nausea or vomiting. 12/26/17   Ladell Pier, MD  simvastatin (ZOCOR) 40 MG tablet Take 1 tablet (40 mg total) by mouth daily at 6 PM. 05/07/17   Minus Breeding, MD  Vital Signs: BP 135/88 (BP Location: Left Arm)   Pulse 78   Temp 98.2 F (36.8 C) (Oral)   Resp 16   SpO2 100%   Physical Exam awake/alert; chest- CTA bilat; heart- RRR; abd-soft,+BS,mild diffuse tenderness; bilat LE edema noted; RUE PICC in place  Imaging: No results found.  Labs:  CBC: Recent Labs    12/19/17 1559 12/25/17 1043 12/31/17 1022 01/02/18 1335  WBC 9.3 9.9 9.0 13.7*  HGB 11.7* 11.8* 11.2* 10.7*  HCT 36.4* 36.5* 34.0* 33.1*  PLT 233 245 244 267    COAGS: Recent Labs     12/25/17 1043  INR 1.12  APTT 35    BMP: Recent Labs    12/24/17 1338 12/25/17 1100 12/31/17 1022 01/02/18 1335  NA 141 144 143 143  K 3.7 3.6 3.3* 4.1  CL 104 107 107 111  CO2 24 26 26 25   GLUCOSE 81 103* 136* 167*  BUN 18 15 13 21   CALCIUM 10.5* 10.0 9.9 10.0  CREATININE 1.28* 1.43* 1.05 0.89  GFRNONAA 52* 45* >60 >60  GFRAA 60* 52* >60 >60    LIVER FUNCTION TESTS: Recent Labs    12/08/17 1602 12/19/17 1559 12/24/17 1338 12/31/17 1022  BILITOT 0.3 0.4 0.5 0.5  AST 20 24 24 22   ALT 12 11 12 11   ALKPHOS 86 93 85 95  PROT 6.4 7.4 6.7 6.9  ALBUMIN 3.8 3.5 3.2* 3.3*    Assessment and Plan: Pt with history of large B-cell lymphoma ;  presents today for Port-A-Cath placement for additional chemotherapy. Risks and benefits of image guided port-a-catheter placement was discussed with the patient including, but not limited to bleeding, infection, pneumothorax, or fibrin sheath development and need for additional procedures.  All of the patient's questions were answered, patient is agreeable to proceed. Consent signed and in chart.  LABS PENDING   Electronically Signed: D. Rowe Robert, PA-C 01/05/2018, 1:11 PM   I spent a total of 20 minutes  at the the patient's bedside AND on the patient's hospital floor or unit, greater than 50% of which was counseling/coordinating care for port a cath placement

## 2018-01-06 ENCOUNTER — Encounter (HOSPITAL_COMMUNITY)
Admission: RE | Admit: 2018-01-06 | Discharge: 2018-01-06 | Disposition: A | Discharge status: Home / Self Care | Payer: Medicare Other | Source: Ambulatory Visit | Attending: Oncology | Admitting: Oncology

## 2018-01-06 DIAGNOSIS — D490 Neoplasm of unspecified behavior of digestive system: Secondary | ICD-10-CM | POA: Diagnosis not present

## 2018-01-06 DIAGNOSIS — C8590 Non-Hodgkin lymphoma, unspecified, unspecified site: Secondary | ICD-10-CM | POA: Diagnosis not present

## 2018-01-06 LAB — GLUCOSE, CAPILLARY: Glucose-Capillary: 101 mg/dL — ABNORMAL HIGH (ref 70–99)

## 2018-01-06 MED ORDER — FLUDEOXYGLUCOSE F - 18 (FDG) INJECTION
8.4000 | Freq: Once | INTRAVENOUS | Status: AC
  Administered 2018-01-06: 8.4 via INTRAVENOUS

## 2018-01-08 ENCOUNTER — Inpatient Hospital Stay: Payer: Medicare Other

## 2018-01-08 ENCOUNTER — Telehealth: Payer: Self-pay | Admitting: Nurse Practitioner

## 2018-01-08 ENCOUNTER — Encounter: Payer: Self-pay | Admitting: Nurse Practitioner

## 2018-01-08 ENCOUNTER — Inpatient Hospital Stay (HOSPITAL_BASED_OUTPATIENT_CLINIC_OR_DEPARTMENT_OTHER): Payer: Medicare Other | Admitting: Nurse Practitioner

## 2018-01-08 VITALS — BP 118/65 | HR 99 | Temp 98.1°F | Resp 16 | Ht 68.0 in | Wt 148.6 lb

## 2018-01-08 DIAGNOSIS — Z5189 Encounter for other specified aftercare: Secondary | ICD-10-CM | POA: Diagnosis not present

## 2018-01-08 DIAGNOSIS — C8590 Non-Hodgkin lymphoma, unspecified, unspecified site: Secondary | ICD-10-CM

## 2018-01-08 DIAGNOSIS — C8330 Diffuse large B-cell lymphoma, unspecified site: Secondary | ICD-10-CM

## 2018-01-08 DIAGNOSIS — D701 Agranulocytosis secondary to cancer chemotherapy: Secondary | ICD-10-CM | POA: Diagnosis not present

## 2018-01-08 DIAGNOSIS — Z5112 Encounter for antineoplastic immunotherapy: Secondary | ICD-10-CM | POA: Diagnosis not present

## 2018-01-08 DIAGNOSIS — Z8572 Personal history of non-Hodgkin lymphomas: Secondary | ICD-10-CM

## 2018-01-08 DIAGNOSIS — B370 Candidal stomatitis: Secondary | ICD-10-CM | POA: Diagnosis not present

## 2018-01-08 DIAGNOSIS — R109 Unspecified abdominal pain: Secondary | ICD-10-CM | POA: Diagnosis not present

## 2018-01-08 DIAGNOSIS — N1330 Unspecified hydronephrosis: Secondary | ICD-10-CM | POA: Diagnosis not present

## 2018-01-08 DIAGNOSIS — R112 Nausea with vomiting, unspecified: Secondary | ICD-10-CM

## 2018-01-08 DIAGNOSIS — Z5111 Encounter for antineoplastic chemotherapy: Secondary | ICD-10-CM | POA: Diagnosis not present

## 2018-01-08 DIAGNOSIS — R1909 Other intra-abdominal and pelvic swelling, mass and lump: Secondary | ICD-10-CM | POA: Diagnosis not present

## 2018-01-08 DIAGNOSIS — K5900 Constipation, unspecified: Secondary | ICD-10-CM | POA: Diagnosis not present

## 2018-01-08 DIAGNOSIS — R627 Adult failure to thrive: Secondary | ICD-10-CM | POA: Diagnosis not present

## 2018-01-08 LAB — CBC WITH DIFFERENTIAL (CANCER CENTER ONLY)
Basophils Absolute: 0 10*3/uL (ref 0.0–0.1)
Basophils Relative: 2 %
Eosinophils Absolute: 0 10*3/uL (ref 0.0–0.5)
Eosinophils Relative: 5 %
HEMATOCRIT: 32.5 % — AB (ref 38.4–49.9)
Hemoglobin: 10.7 g/dL — ABNORMAL LOW (ref 13.0–17.1)
LYMPHS ABS: 0.6 10*3/uL — AB (ref 0.9–3.3)
LYMPHS PCT: 88 %
MCH: 27 pg — AB (ref 27.2–33.4)
MCHC: 32.9 g/dL (ref 32.0–36.0)
MCV: 82.1 fL (ref 79.3–98.0)
MONO ABS: 0 10*3/uL — AB (ref 0.1–0.9)
MONOS PCT: 5 %
Neutro Abs: 0 10*3/uL — CL (ref 1.5–6.5)
Neutrophils Relative %: 0 %
Platelet Count: 121 10*3/uL — ABNORMAL LOW (ref 140–400)
RBC: 3.96 MIL/uL — ABNORMAL LOW (ref 4.20–5.82)
RDW: 14.3 % (ref 11.0–14.6)
WBC Count: 0.7 10*3/uL — CL (ref 4.0–10.3)

## 2018-01-08 LAB — BASIC METABOLIC PANEL - CANCER CENTER ONLY
Anion gap: 11 (ref 5–15)
BUN: 19 mg/dL (ref 8–23)
CHLORIDE: 102 mmol/L (ref 98–111)
CO2: 27 mmol/L (ref 22–32)
Calcium: 9.4 mg/dL (ref 8.9–10.3)
Creatinine: 0.86 mg/dL (ref 0.61–1.24)
GFR, Est AFR Am: >60 mL/min (ref >60)
GLUCOSE: 118 mg/dL — AB (ref 70–99)
Potassium: 3.9 mmol/L (ref 3.5–5.1)
Sodium: 140 mmol/L (ref 135–145)

## 2018-01-08 LAB — URIC ACID: Uric Acid, Serum: 3.9 mg/dL (ref 3.7–8.6)

## 2018-01-08 MED ORDER — CIPROFLOXACIN HCL 500 MG PO TABS: 500.0000 mg | ORAL_TABLET | Freq: Two times a day (BID) | ORAL | 0 refills | Status: DC

## 2018-01-08 MED ORDER — ONDANSETRON HCL 4 MG/2ML IJ SOLN
INTRAMUSCULAR | Status: AC
  Filled 2018-01-08: qty 4

## 2018-01-08 MED ORDER — ONDANSETRON HCL 4 MG/2ML IJ SOLN
8.0000 mg | Freq: Once | INTRAMUSCULAR | Status: AC
  Administered 2018-01-08: 8 mg via INTRAVENOUS

## 2018-01-08 MED ORDER — SODIUM CHLORIDE 0.9 % IV SOLN
INTRAVENOUS | Status: DC
  Administered 2018-01-08: 15:00:00 via INTRAVENOUS
  Filled 2018-01-08 (×2): qty 250

## 2018-01-08 MED ORDER — SODIUM CHLORIDE 0.9 % IV SOLN: Freq: Once | INTRAVENOUS | Status: DC

## 2018-01-08 MED ORDER — ONDANSETRON 8 MG PO TBDP: 8.0000 mg | ORAL_TABLET | Freq: Three times a day (TID) | ORAL | 1 refills | Status: AC | PRN

## 2018-01-08 MED ORDER — FLUCONAZOLE 100 MG PO TABS: 100.0000 mg | ORAL_TABLET | Freq: Every day | ORAL | 0 refills | Status: DC

## 2018-01-08 MED ORDER — DEXAMETHASONE SODIUM PHOSPHATE 10 MG/ML IJ SOLN
10.0000 mg | Freq: Once | INTRAMUSCULAR | Status: AC
  Administered 2018-01-08: 10 mg via INTRAVENOUS

## 2018-01-08 MED ORDER — HEPARIN SOD (PORK) LOCK FLUSH 100 UNIT/ML IV SOLN
500.0000 [IU] | Freq: Once | INTRAVENOUS | Status: AC | PRN
  Administered 2018-01-08: 500 [IU]
  Filled 2018-01-08: qty 5

## 2018-01-08 MED ORDER — DEXAMETHASONE SODIUM PHOSPHATE 10 MG/ML IJ SOLN
INTRAMUSCULAR | Status: AC
  Filled 2018-01-08: qty 1

## 2018-01-08 MED ORDER — SODIUM CHLORIDE 0.9% FLUSH
10.0000 mL | Freq: Once | INTRAVENOUS | Status: AC | PRN
  Administered 2018-01-08: 10 mL
  Filled 2018-01-08: qty 10

## 2018-01-08 NOTE — Telephone Encounter (Signed)
Scheduled appt per 8/29 los - pt aware of appt- spoke with patient.  - avs given in treatment area.

## 2018-01-08 NOTE — Progress Notes (Addendum)
[facility] OFFICE PROGRESS NOTE   Diagnosis: Non-Hodgkin's lymphoma  INTERVAL HISTORY:   David Little returns as scheduled.  He completed cycle 1 CHOP/Rituxan 01/01/2018.  He reports mild nausea for about 2 weeks.  Yesterday he began vomiting.  Thus far today he has vomited 3 or 4 times.  He is not tolerating solids or liquids.  His mouth feels sore.  No throat pain.  He does note that feels "hanging" in his throat.  He developed constipation after the chemotherapy.  No fever or chills.  No bleeding.  No shortness of breath.  No urinary symptoms.  Objective:  Vital signs in last 24 hours:  Blood pressure 118/65, pulse 99, temperature (!) 97.5 F (36.4 C), temperature source Oral, resp. rate 16, height 5\' 8"  (1.727 m), weight 148 lb 9.6 oz (67.4 kg), SpO2 100 %.    HEENT: Patchy erythema at the posterior palate.  Thrush noted bilateral buccal mucosa and posterior palate. Resp: Lungs clear bilaterally. Cardio: Regular rate and rhythm. GI: Abdomen soft and nontender.  No hepatomegaly. Vascular: Trace edema at the lower legs bilaterally. Neuro: Alert and oriented. Port-A-Cath site is covered with a gauze bandage.  Lab Results:  Lab Results  Component Value Date   WBC 0.7 (LL) 01/08/2018   HGB 10.7 (L) 01/08/2018   HCT 32.5 (L) 01/08/2018   MCV 82.1 01/08/2018   PLT 121 (L) 01/08/2018   NEUTROABS 0.0 (LL) 01/08/2018    Imaging:  Nm Pet Image Initial (pi) Skull Base To Thigh  Addendum Date: 01/07/2018   ADDENDUM REPORT: 01/07/2018 10:04 ADDENDUM: The original report was by Dr. Van Clines. The following addendum is by Dr. Van Clines: One other impression of note to add: 9. The conglomerate mass in the retroperitoneum and mesentery surrounds the celiac trunk and superior mesenteric artery, and potentially invades the pancreas, with Deauville 5 abnormal activity in the pancreatic body and tail. Electronically Signed   By: Van Clines M.D.   On:  01/07/2018 10:04   Result Date: 01/07/2018 CLINICAL DATA:  Initial treatment strategy for non-Hodgkin's lymphoma. EXAM: NUCLEAR MEDICINE PET SKULL BASE TO THIGH TECHNIQUE: 8.4 mCi F-18 FDG was injected intravenously. Full-ring PET imaging was performed from the skull base to thigh after the radiotracer. CT data was obtained and used for attenuation correction and anatomic localization. Fasting blood glucose: 101 mg/dl COMPARISON:  CT examination from 12/24/2017 FINDINGS: Mediastinal blood pool activity: SUV max 1.7 Background hepatic parenchymal activity: SUV max 2.1 NECK: No significant abnormal hypermetabolic activity in this region. Incidental CT findings: Bilateral common carotid atherosclerotic calcification. CHEST: Hypermetabolic nodule in the suprasternal notch measures 2.2 by 1.4 cm on image 59/4 and has a maximum SUV of 7.4. A para-aortic lymph node near the descending thoracic aorta with indistinct margins and measuring approximately 7 mm in diameter on image 95/4 has a maximum SUV of 4.3. There is scattered activity in the thoracic esophagus, mostly low-grade. Maximum SUV is in the distal esophagus with maximum SUV of 5.3. No definite corresponding CT abnormality. Incidental CT findings: Right Port-A-Cath tip: Cavoatrial junction. Coronary, aortic arch, and branch vessel atherosclerotic vascular disease. Prior CABG noted. Small left pleural effusion. Mild peripheral interstitial accentuation in the lung bases. ABDOMEN/PELVIS: Prominent retroperitoneal adenopathy encasing the retroperitoneal vasculature in with its epicenter eccentric to the left the level of the renal vasculature. In the vicinity of this epicenter of the maximum SUV is 19.0. The adenopathy extends into the mesentery and surrounds the proximal superior mesenteric artery. The  conglomerate mass surrounds the celiac trunk and encases the aorta with partial abutment of the IVC. The mass abuts the left adrenal gland and abuts and potentially  invades the pancreas in the vicinity of the pancreatic body. Some localized accentuated activity in the pancreatic tail has a maximum SUV of 9.9. There are multiple abnormal foci of hypermetabolic activity in the liver corresponding to faintly hypodense liver lesions. An index lesion in the caudate lobe measuring 2.5 by 1.9 cm on image 108/4 has a maximum SUV of 6.9. At least 5 lesions are identified. Spleen unremarkable. There is left hydronephrosis with prominent left collecting system and left hydroureter extending down to the urinary bladder. Tumor involvement of the ureter is not completely excluded Mild multifocal accentuated activity in the stomach without definite CT correlate. Maximum SUV is in the vicinity of the posterior fundus at 6.5. Distended appendix with internal low-density, and faintly accentuated metabolic activity focally along the appendiceal orifice, maximum SUV 4.8. Sigmoid diverticulosis with accentuated activity, probably physiologic or inflammatory rather than related to sigmoid colon tumor. Incidental activity in a diverticulum from the right posterior urinary bladder. Incidental CT findings: Bladder wall thickening potentially from chronic outlet obstruction or cystitis. Faint linear density along the luminal margin of the posterior wall the urinary bladder resemble calcifications but were not present on 12/24/2017. Mildly prominent prostate gland indents the bladder base. SKELETON: Eccentric to the left in the T2 vertebral body there is questionable faint sclerosis along with accentuated metabolic activity, maximum SUV 4.7. Accentuated muscular activity along the distal right sternocleidomastoid muscle with maximum SUV 4.8. Typically I would describe this to physiologic activity, but the presence of the immediately adjacent hypermetabolic nodule of the suprasternal notch raises the possibility of some degree of distal sternocleidomastoid muscular involvement. Further complicating this  assessment is the fact that the newly placed Port-A-Cath partially traverses the posterior margin of the sternocleidomastoid, and might introduce inflammation. Mildly accentuated activity in the right proximal humeral shaft primarily within the marrow, maximum SUV 3.1. 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 No flattening or collapse. IMPRESSION: 1. Large hypermetabolic retroperitoneal and mesenteric mass compatible with Deauville 5 lymphoma. 2. Mild thoracic involvement including a hypermetabolic nodule in the suprasternal notch and a small hypermetabolic left para-aortic lymph node in the thorax. 3. Musculoskeletal involvement including the left T2 vertebra, potentially the right distal-medial sternocleidomastoid muscle, and the right humeral shaft. 4. Approximately 5 hypermetabolic liver lesions, index lesion in the caudate lobe measuring up to 2.5 cm in length with maximum SUV 6.9 (Deauville 5). 5. Accentuated activity near the orifice of the appendix possibly from a polyp or lesion. There is prominent appendiceal dilatation possibly from lack of drainage related to the lesion at the appendiceal neck, or due to superimposed mucocele. This may warrant surgical intervention. 6. Other questionable activity along the gastrointestinal tract including scattered esophageal activity (maximum 5.3 in the distal esophagus) and in the proximal stomach (maximum SUV 6.5), without definite correlating CT abnormality. 7. Left hydronephrosis and hydroureter extending down to the somewhat distended urinary bladder which demonstrates wall thickening and trabeculation. Cannot exclude bladder outlet obstruction. I cannot totally exclude ureteral involvement by tumor on the left. 8. Other imaging findings of potential clinical significance: Aortic Atherosclerosis (ICD10-I70.0). Coronary atherosclerosis. Small left pleural effusion. Sigmoid  diverticulosis. Mildly prominent prostate gland. 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 Electronically Signed: By: Van Clines M.D. On: 01/07/2018  09:38    Medications: I have reviewed the patient's current medications.  Assessment/Plan: 1. Non-Hodgkin lymphoma - he completed fludarabine chemotherapy in February 2003. He remains in clinical remission.  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 Indeterminate lesion in the left liver, tubular structure in the right lower quadrant concerning for mucocele, indeterminate left lower lobe nodule  CT biopsy of retroperitoneal mass 12/25/2017- diffuse large B-cell lymphoma, CD20 positive  Cycle 1 CHOP/Rituxan 01/01/2018  PET scan 01/06/2018- large hypermetabolic retroperitoneal and mesenteric mass; mild thoracic involvement including a hypermetabolic nodule in the suprasternal notch and a small hypermetabolic left periaortic lymph node; musculoskeletal involvement including the left T2 vertebrae, potentially the right distal medial sternocleidomastoid muscle and the right humeral shaft; approximately 5 hypermetabolic liver lesions; accentuated activity near the orifice of the appendix; other questionable activity along the gastrointestinal tract including scattered esophageal activity and in the proximal stomach without definite correlating CT abnormalities; left hydronephrosis and hydroureter extending down to the somewhat distended urinary bladder which demonstrates wall thickening and trabeculation.  2. History of coronary artery disease - status post coronary artery bypass surgery. 3. Pneumococcal vaccine in April 2014 4. Left knee pain  and swelling-? Left knee arthritis 5.Hypercalcemia 12/19/2017, status post Zometa 12/19/2017-improved 6.Elevated creatininesecondary to obstructing left peritoneal mass  CT 12/24/2017- left hydronephrosis secondary to obstructing retroperitoneal mass 7.Anorexia/weight loss/failure to thrive 8.   Echocardiogram 12/31/2017- LVEF 50-55%, mild diffuse hypokinesis 9.   Neutropenia secondary to chemotherapy.  He will begin ciprofloxacin. 10.  Nausea/vomiting, question delayed nausea related to chemotherapy.  IV fluids, Zofran/dexamethasone; Zofran as needed at home. 11.  Constipation, question secondary to chemotherapy.  Begin MiraLAX. 12.  Oral candidiasis, question early mucositis.  He will complete a course of Diflucan.  Disposition: Mr. Herda is currently day 8 cycle 1 CHOP/Rituxan.  We reviewed the CBC from today.  He has neutropenia.  We reviewed neutropenic precautions.  He understands to contact the office with fever, chills, other signs of infection.  He will begin ciprofloxacin 500 mg twice daily.  He is having nausea/vomiting, difficulty maintaining adequate oral hydration.  Question delayed nausea related to chemotherapy.  He will receive a liter of normal saline in the office today as well as Zofran/dexamethasone.  We will send a prescription for Zofran to his pharmacy.  We will make sure he is able to tolerate fluids orally while in the office.  He has oral candidiasis.  He will complete a 4-day course of Diflucan.  He understands to hold simvastatin while he is taking Diflucan due to a possible interaction.  For the constipation he will begin MiraLAX.  He will return for a follow-up CBC, possible IV fluids 01/09/2018.  His next scheduled follow-up visit prior to cycle 2 CHOP/Rituxan is on 01/22/2018.  We will see him sooner if needed.  Patient seen with Dr. Benay Spice.  25 minutes were spent face-to-face at today's visit with the majority of that time involved in  counseling/coordination of care.    David Little ANP/GNP-BC   01/08/2018  1:45 PM  This was a shared visit with David Little.  Mr. Mottola was interviewed and examined.  We reviewed the staging PET with Mr. Meek.  He has developed severe neutropenia at day 8 following R-CHOP.  He received Neulasta support.  He knows to contact  us for a fever.  He has nausea and vomiting.  This is most likely secondary to delayed nausea from chemotherapy.  We have a low medical suspicion for a bowel obstruction.  Constipation may also be playing a role in the nausea.  He felt better after receiving Zofran and Decadron today.  David Manson, MD

## 2018-01-09 ENCOUNTER — Inpatient Hospital Stay: Payer: Medicare Other

## 2018-01-09 ENCOUNTER — Other Ambulatory Visit: Payer: Self-pay | Admitting: Nurse Practitioner

## 2018-01-09 ENCOUNTER — Encounter (HOSPITAL_COMMUNITY): Admission: RE | Payer: Self-pay | Source: Ambulatory Visit

## 2018-01-09 ENCOUNTER — Encounter: Payer: Self-pay | Admitting: Cardiology

## 2018-01-09 ENCOUNTER — Telehealth: Payer: Self-pay | Admitting: Nurse Practitioner

## 2018-01-09 ENCOUNTER — Ambulatory Visit (HOSPITAL_COMMUNITY): Admission: RE | Admit: 2018-01-09 | Payer: Medicare Other | Source: Ambulatory Visit | Admitting: Urology

## 2018-01-09 ENCOUNTER — Other Ambulatory Visit: Payer: Self-pay

## 2018-01-09 ENCOUNTER — Ambulatory Visit: Payer: Self-pay | Admitting: Nurse Practitioner

## 2018-01-09 DIAGNOSIS — K5900 Constipation, unspecified: Secondary | ICD-10-CM | POA: Diagnosis not present

## 2018-01-09 DIAGNOSIS — R1909 Other intra-abdominal and pelvic swelling, mass and lump: Secondary | ICD-10-CM | POA: Diagnosis not present

## 2018-01-09 DIAGNOSIS — C8330 Diffuse large B-cell lymphoma, unspecified site: Secondary | ICD-10-CM | POA: Diagnosis not present

## 2018-01-09 DIAGNOSIS — Z8572 Personal history of non-Hodgkin lymphomas: Secondary | ICD-10-CM | POA: Diagnosis not present

## 2018-01-09 DIAGNOSIS — R109 Unspecified abdominal pain: Secondary | ICD-10-CM | POA: Diagnosis not present

## 2018-01-09 DIAGNOSIS — R627 Adult failure to thrive: Secondary | ICD-10-CM | POA: Diagnosis not present

## 2018-01-09 DIAGNOSIS — N1330 Unspecified hydronephrosis: Secondary | ICD-10-CM | POA: Diagnosis not present

## 2018-01-09 DIAGNOSIS — C8590 Non-Hodgkin lymphoma, unspecified, unspecified site: Secondary | ICD-10-CM

## 2018-01-09 DIAGNOSIS — Z5112 Encounter for antineoplastic immunotherapy: Secondary | ICD-10-CM | POA: Diagnosis not present

## 2018-01-09 DIAGNOSIS — D701 Agranulocytosis secondary to cancer chemotherapy: Secondary | ICD-10-CM | POA: Diagnosis not present

## 2018-01-09 DIAGNOSIS — R112 Nausea with vomiting, unspecified: Secondary | ICD-10-CM | POA: Diagnosis not present

## 2018-01-09 DIAGNOSIS — B370 Candidal stomatitis: Secondary | ICD-10-CM | POA: Diagnosis not present

## 2018-01-09 DIAGNOSIS — Z5189 Encounter for other specified aftercare: Secondary | ICD-10-CM | POA: Diagnosis not present

## 2018-01-09 DIAGNOSIS — Z5111 Encounter for antineoplastic chemotherapy: Secondary | ICD-10-CM | POA: Diagnosis not present

## 2018-01-09 LAB — CBC WITH DIFFERENTIAL (CANCER CENTER ONLY)
BASOS ABS: 0 10*3/uL (ref 0.0–0.1)
BASOS PCT: 0 %
EOS PCT: 1 %
Eosinophils Absolute: 0 10*3/uL (ref 0.0–0.5)
HCT: 29.4 % — ABNORMAL LOW (ref 38.4–49.9)
Hemoglobin: 9.6 g/dL — ABNORMAL LOW (ref 13.0–17.1)
LYMPHS PCT: 53 %
Lymphs Abs: 0.5 10*3/uL — ABNORMAL LOW (ref 0.9–3.3)
MCH: 26.5 pg — ABNORMAL LOW (ref 27.2–33.4)
MCHC: 32.6 g/dL (ref 32.0–36.0)
MCV: 81.3 fL (ref 79.3–98.0)
MONO ABS: 0.1 10*3/uL (ref 0.1–0.9)
Monocytes Relative: 14 %
Neutro Abs: 0.3 10*3/uL — CL (ref 1.5–6.5)
Neutrophils Relative %: 32 %
Platelet Count: 125 10*3/uL — ABNORMAL LOW (ref 140–400)
RBC: 3.61 MIL/uL — ABNORMAL LOW (ref 4.20–5.82)
RDW: 14.2 % (ref 11.0–14.6)
WBC Count: 0.9 10*3/uL — CL (ref 4.0–10.3)

## 2018-01-09 LAB — URIC ACID: URIC ACID, SERUM: 3.5 mg/dL — AB (ref 3.7–8.6)

## 2018-01-09 SURGERY — CYSTOSCOPY, WITH RETROGRADE PYELOGRAM AND URETERAL STENT INSERTION
Anesthesia: General | Laterality: Bilateral

## 2018-01-09 MED ORDER — HEPARIN SOD (PORK) LOCK FLUSH 100 UNIT/ML IV SOLN
500.0000 [IU] | Freq: Once | INTRAVENOUS | Status: AC
  Administered 2018-01-09: 500 [IU] via INTRAVENOUS
  Filled 2018-01-09: qty 5

## 2018-01-09 MED ORDER — SODIUM CHLORIDE 0.9% FLUSH
10.0000 mL | Freq: Once | INTRAVENOUS | Status: AC
  Administered 2018-01-09: 10 mL via INTRAVENOUS
  Filled 2018-01-09: qty 10

## 2018-01-09 MED ORDER — SODIUM CHLORIDE 0.9 % IV SOLN
INTRAVENOUS | Status: AC
  Administered 2018-01-09: 16:00:00 via INTRAVENOUS
  Filled 2018-01-09 (×2): qty 250

## 2018-01-09 NOTE — Patient Instructions (Addendum)
Dehydration, Adult Dehydration is when there is not enough fluid or water in your body. This happens when you lose more fluids than you take in. Dehydration can range from mild to very bad. It should be treated right away to keep it from getting very bad. Symptoms of mild dehydration may include:  Thirst.  Dry lips.  Slightly dry mouth.  Dry, warm skin.  Dizziness. Symptoms of moderate dehydration may include:  Very dry mouth.  Muscle cramps.  Dark pee (urine). Pee may be the color of tea.  Your body making less pee.  Your eyes making fewer tears.  Heartbeat that is uneven or faster than normal (palpitations).  Headache.  Light-headedness, especially when you stand up from sitting.  Fainting (syncope). Symptoms of very bad dehydration may include:  Changes in skin, such as: ? Cold and clammy skin. ? Blotchy (mottled) or pale skin. ? Skin that does not quickly return to normal after being lightly pinched and let go (poor skin turgor).  Changes in body fluids, such as: ? Feeling very thirsty. ? Your eyes making fewer tears. ? Not sweating when body temperature is high, such as in hot weather. ? Your body making very little pee.  Changes in vital signs, such as: ? Weak pulse. ? Pulse that is more than 100 beats a minute when you are sitting still. ? Fast breathing. ? Low blood pressure.  Other changes, such as: ? Sunken eyes. ? Cold hands and feet. ? Confusion. ? Lack of energy (lethargy). ? Trouble waking up from sleep. ? Short-term weight loss. ? Unconsciousness. Follow these instructions at home:  If told by your doctor, drink an ORS: ? Make an ORS by using instructions on the package. ? Start by drinking small amounts, about  cup (120 mL) every 5-10 minutes. ? Slowly drink more until you have had the amount that your doctor said to have.  Drink enough clear fluid to keep your pee clear or pale yellow. If you were told to drink an ORS, finish the ORS  first, then start slowly drinking clear fluids. Drink fluids such as: ? Water. Do not drink only water by itself. Doing that can make the salt (sodium) level in your body get too low (hyponatremia). ? Ice chips. ? Fruit juice that you have added water to (diluted). ? Low-calorie sports drinks.  Avoid: ? Alcohol. ? Drinks that have a lot of sugar. These include high-calorie sports drinks, fruit juice that does not have water added, and soda. ? Caffeine. ? Foods that are greasy or have a lot of fat or sugar.  Take over-the-counter and prescription medicines only as told by your doctor.  Do not take salt tablets. Doing that can make the salt level in your body get too high (hypernatremia).  Eat foods that have minerals (electrolytes). Examples include bananas, oranges, potatoes, tomatoes, and spinach.  Keep all follow-up visits as told by your doctor. This is important. Contact a doctor if:  You have belly (abdominal) pain that: ? Gets worse. ? Stays in one area (localizes).  You have a rash.  You have a stiff neck.  You get angry or annoyed more easily than normal (irritability).  You are more sleepy than normal.  You have a harder time waking up than normal.  You feel: ? Weak. ? Dizzy. ? Very thirsty.  You have peed (urinated) only a small amount of very dark pee during 6-8 hours. Get help right away if:  You have symptoms of   very bad dehydration.  You cannot drink fluids without throwing up (vomiting).  Your symptoms get worse with treatment.  You have a fever.  You have a very bad headache.  You are throwing up or having watery poop (diarrhea) and it: ? Gets worse. ? Does not go away.  You have blood or something green (bile) in your throw-up.  You have blood in your poop (stool). This may cause poop to look black and tarry.  You have not peed in 6-8 hours.  You pass out (faint).  Your heart rate when you are sitting still is more than 100 beats a  minute.  You have trouble breathing. This information is not intended to replace advice given to you by your health care provider. Make sure you discuss any questions you have with your health care provider. Document Released: 02/23/2009 Document Revised: 11/17/2015 Document Reviewed: 06/23/2015 Elsevier Interactive Patient Education  2018 [employer]  Dehydration, Adult Dehydration is when there is not enough fluid or water in your body. This happens when you lose more fluids than you take in. Dehydration can range from mild to very bad. It should be treated right away to keep it from getting very bad. Symptoms of mild dehydration may include:  Thirst.  Dry lips.  Slightly dry mouth.  Dry, warm skin.  Dizziness. Symptoms of moderate dehydration may include:  Very dry mouth.  Muscle cramps.  Dark pee (urine). Pee may be the color of tea.  Your body making less pee.  Your eyes making fewer tears.  Heartbeat that is uneven or faster than normal (palpitations).  Headache.  Light-headedness, especially when you stand up from sitting.  Fainting (syncope). Symptoms of very bad dehydration may include:  Changes in skin, such as: ? Cold and clammy skin. ? Blotchy (mottled) or pale skin. ? Skin that does not quickly return to normal after being lightly pinched and let go (poor skin turgor).  Changes in body fluids, such as: ? Feeling very thirsty. ? Your eyes making fewer tears. ? Not sweating when body temperature is high, such as in hot weather. ? Your body making very little pee.  Changes in vital signs, such as: ? Weak pulse. ? Pulse that is more than 100 beats a minute when you are sitting still. ? Fast breathing. ? Low blood pressure.  Other changes, such as: ? Sunken eyes. ? Cold hands and feet. ? Confusion. ? Lack of energy (lethargy). ? Trouble waking up from sleep. ? Short-term weight loss. ? Unconsciousness. Follow these instructions at  home:  If told by your doctor, drink an ORS: ? Make an ORS by using instructions on the package. ? Start by drinking small amounts, about  cup (120 mL) every 5-10 minutes. ? Slowly drink more until you have had the amount that your doctor said to have.  Drink enough clear fluid to keep your pee clear or pale yellow. If you were told to drink an ORS, finish the ORS first, then start slowly drinking clear fluids. Drink fluids such as: ? Water. Do not drink only water by itself. Doing that can make the salt (sodium) level in your body get too low (hyponatremia). ? Ice chips. ? Fruit juice that you have added water to (diluted). ? Low-calorie sports drinks.  Avoid: ? Alcohol. ? Drinks that have a lot of sugar. These include high-calorie sports drinks, fruit juice that does not have water added, and soda. ? Caffeine. ? Foods that are greasy or have   a lot of fat or sugar.  Take over-the-counter and prescription medicines only as told by your doctor.  Do not take salt tablets. Doing that can make the salt level in your body get too high (hypernatremia).  Eat foods that have minerals (electrolytes). Examples include bananas, oranges, potatoes, tomatoes, and spinach.  Keep all follow-up visits as told by your doctor. This is important. Contact a doctor if:  You have belly (abdominal) pain that: ? Gets worse. ? Stays in one area (localizes).  You have a rash.  You have a stiff neck.  You get angry or annoyed more easily than normal (irritability).  You are more sleepy than normal.  You have a harder time waking up than normal.  You feel: ? Weak. ? Dizzy. ? Very thirsty.  You have peed (urinated) only a small amount of very dark pee during 6-8 hours. Get help right away if:  You have symptoms of very bad dehydration.  You cannot drink fluids without throwing up (vomiting).  Your symptoms get worse with treatment.  You have a fever.  You have a very bad headache.  You  are throwing up or having watery poop (diarrhea) and it: ? Gets worse. ? Does not go away.  You have blood or something green (bile) in your throw-up.  You have blood in your poop (stool). This may cause poop to look black and tarry.  You have not peed in 6-8 hours.  You pass out (faint).  Your heart rate when you are sitting still is more than 100 beats a minute.  You have trouble breathing. This information is not intended to replace advice given to you by your health care provider. Make sure you discuss any questions you have with your health care provider. Document Released: 02/23/2009 Document Revised: 11/17/2015 Document Reviewed: 06/23/2015 Elsevier Interactive Patient Education  2018 [employer]  

## 2018-01-09 NOTE — Telephone Encounter (Signed)
Left message with appt date and time for labs 9/3

## 2018-01-13 ENCOUNTER — Encounter: Payer: Self-pay | Admitting: Cardiology

## 2018-01-13 ENCOUNTER — Ambulatory Visit (INDEPENDENT_AMBULATORY_CARE_PROVIDER_SITE_OTHER): Payer: Medicare Other | Admitting: Cardiology

## 2018-01-13 ENCOUNTER — Inpatient Hospital Stay: Payer: Medicare Other | Attending: Oncology

## 2018-01-13 VITALS — BP 112/66 | HR 74 | Ht 68.0 in | Wt 143.8 lb

## 2018-01-13 DIAGNOSIS — N1330 Unspecified hydronephrosis: Secondary | ICD-10-CM | POA: Insufficient documentation

## 2018-01-13 DIAGNOSIS — D701 Agranulocytosis secondary to cancer chemotherapy: Secondary | ICD-10-CM | POA: Insufficient documentation

## 2018-01-13 DIAGNOSIS — Z5189 Encounter for other specified aftercare: Secondary | ICD-10-CM | POA: Insufficient documentation

## 2018-01-13 DIAGNOSIS — I2510 Atherosclerotic heart disease of native coronary artery without angina pectoris: Secondary | ICD-10-CM

## 2018-01-13 DIAGNOSIS — Z5111 Encounter for antineoplastic chemotherapy: Secondary | ICD-10-CM | POA: Insufficient documentation

## 2018-01-13 DIAGNOSIS — Z5112 Encounter for antineoplastic immunotherapy: Secondary | ICD-10-CM | POA: Insufficient documentation

## 2018-01-13 DIAGNOSIS — C8300 Small cell B-cell lymphoma, unspecified site: Secondary | ICD-10-CM | POA: Diagnosis present

## 2018-01-13 DIAGNOSIS — C8330 Diffuse large B-cell lymphoma, unspecified site: Secondary | ICD-10-CM | POA: Diagnosis not present

## 2018-01-13 DIAGNOSIS — C8590 Non-Hodgkin lymphoma, unspecified, unspecified site: Secondary | ICD-10-CM

## 2018-01-13 LAB — CBC WITH DIFFERENTIAL (CANCER CENTER ONLY)
BASOS PCT: 1 %
Basophils Absolute: 0 10*3/uL (ref 0.0–0.1)
Eosinophils Absolute: 0 10*3/uL (ref 0.0–0.5)
Eosinophils Relative: 0 %
HEMATOCRIT: 30.4 % — AB (ref 38.4–49.9)
HEMOGLOBIN: 10.1 g/dL — AB (ref 13.0–17.1)
Lymphocytes Relative: 18 %
Lymphs Abs: 1.1 10*3/uL (ref 0.9–3.3)
MCH: 27.4 pg (ref 27.2–33.4)
MCHC: 33 g/dL (ref 32.0–36.0)
MCV: 82.9 fL (ref 79.3–98.0)
MONOS PCT: 8 %
Monocytes Absolute: 0.5 10*3/uL (ref 0.1–0.9)
NEUTROS ABS: 4.3 10*3/uL (ref 1.5–6.5)
NEUTROS PCT: 73 %
Platelet Count: 176 10*3/uL (ref 140–400)
RBC: 3.67 MIL/uL — ABNORMAL LOW (ref 4.20–5.82)
RDW: 14.6 % (ref 11.0–14.6)
WBC: 6 10*3/uL (ref 4.0–10.3)

## 2018-01-13 NOTE — Patient Instructions (Addendum)
Medication Instructions:  Your physician recommends that you continue on your current medications as directed. Please refer to the Current Medication list given to you today.   Labwork: None ordered  Testing/Procedures: None ordered  Follow-Up: Your physician wants you to follow-up with Dr. Warren Lacy in December.  Any Other Special Instructions Will Be Listed Below (If Applicable).  If you experience any shortness of breath, weight gain, swelling in legs or feet, or any chest discomfort please call our office to let us know. [phone]    If you need a refill on your cardiac medications before your next appointment, please call your pharmacy.

## 2018-01-13 NOTE — Progress Notes (Signed)
01/13/2018 [name]   1936/01/19  [id number]  Primary Physician Chipper Herb, MD Primary Cardiologist: Dr. Percival Spanish   Reason for Visit/CC: Routine f/u for CAD  HPI:  David Little is a 82 y.o. male who is being seen today for the evaluation of CAD. He is followed by Dr. Percival Spanish. He has a h/o CABG in 2007 w/ LIMA-LAD and SVG-OM2, HTN, HLD and Non Hodgkin's lymphoma.  In February of this year, he underwent a nuclear stress test which was low risk for ischemia.  Unfortunately, he has had recurrence of his lymphoma and is needing to undergo additional rounds of chemotherapy.  He had an echocardiogram completed on December 31, 2017 for baseline assessment of EF prior to starting chemotherapy.  Echo showed normal left ventricular EF at 50 to 55% with grade 1 diastolic dysfunction.  Mild diffuse hypokinesis was noted.  Global longitudinal strain was measured at -13.2 (abnormal).  The patient reports undergoing 1 session of chemotherapy thus far.  The plan is to have chemotherapy every 3 weeks and a total of 6 treatments.  He will need routine echocardiograms for surveillance.  From a cardiac standpoint, he denies any symptoms of heart failure.  He denies dyspnea, orthopnea, PND and lower extremity edema.  He notes no weight gain but there has been weight loss.  He also denies chest pain, palpitations, lightheadedness, dizziness, syncope/near syncope.  EKG shows normal sinus rhythm with a rate of 74 bpm.  No ischemic abnormalities.  Blood pressure is well controlled at 112/66.  Study Highlights  NST 06/26/17    Nuclear stress EF: 48%.  The left ventricular ejection fraction is mildly decreased (45-54%).  There was no ST segment deviation noted during stress.  The study is normal.  This is a low risk study.   Normal pharmacologic nuclear stress test with no evidence for prior infarct or ischemia.  LVEF calculated at 48% but visually appears better.    2D Echo 12/31/17  Study  Conclusions  - Left ventricle: The cavity size was normal. Systolic function was   normal. The estimated ejection fraction was in the range of 50%   to 55%. Mild diffuse hypokinesis. Doppler parameters are   consistent with abnormal left ventricular relaxation (grade 1   diastolic dysfunction). Doppler parameters are consistent with   indeterminate ventricular filling pressure. - Aortic valve: Transvalvular velocity was within the normal range.   There was no stenosis. There was mild regurgitation. - Mitral valve: Transvalvular velocity was within the normal range.   There was no evidence for stenosis. There was mild regurgitation. - Left atrium: The atrium was mildly dilated. - Right ventricle: The cavity size was normal. Wall thickness was   normal. Systolic function was normal. - Tricuspid valve: There was trivial regurgitation. - Global longitudinal strain -13.2% (abnormal).  Current Meds  Medication Sig  . acetaminophen (TYLENOL) 500 MG tablet Take 1,000 mg by mouth every 6 (six) hours as needed for mild pain or moderate pain.  Marland Kitchen aspirin EC 81 MG tablet Take 1 tablet (81 mg total) by mouth daily.  . ciprofloxacin (CIPRO) 500 MG tablet Take 1 tablet (500 mg total) by mouth 2 (two) times daily.  . fluconazole (DIFLUCAN) 100 MG tablet Take 1 tablet (100 mg total) by mouth daily.  . metoprolol tartrate (LOPRESSOR) 25 MG tablet Take 1 tablet (25 mg total) by mouth 2 (two) times daily.  . Multiple Vitamin (MULTIVITAMIN PO) Take 1 tablet by mouth daily.   Marland Kitchen  nitroGLYCERIN (NITROSTAT) 0.4 MG SL tablet Place 1 tablet (0.4 mg total) under the tongue as needed.  . ondansetron (ZOFRAN-ODT) 8 MG disintegrating tablet Take 1 tablet (8 mg total) by mouth every 8 (eight) hours as needed for nausea or vomiting.  Vladimir Faster Glycol-Propyl Glycol (SYSTANE OP) Apply 1 drop to eye daily as needed (dry eyes).  . prochlorperazine (COMPAZINE) 10 MG tablet Take 1 tablet (10 mg total) by mouth every 6 (six)  hours as needed for nausea or vomiting.  . simvastatin (ZOCOR) 40 MG tablet Take 1 tablet (40 mg total) by mouth daily at 6 PM.   No Known Allergies Past Medical History:  Diagnosis Date  . Coronary artery disease 2007   s/p CABG in 2007 for severe left main disease  . Hypercholesterolemia   . Hyperlipidemia   . Lymphoma ([place]) 2003   without recurrence  . Myocardial infarction ([place]) 2007  . Normal nuclear stress test March 2012   EF 55%. No ischemia. Mild inferior scar  . Skin cancer of face    Family History  Problem Relation Age of Onset  . Heart attack Brother 35       possible, not confirmed  . Unexplained death Mother 39  . Cancer Father 2   Past Surgical History:  Procedure Laterality Date  . CORONARY ARTERY BYPASS GRAFT  2007   Had coronary artery bypass grafting for severe left main coronary artery stenosis--Had follow up stress cardiolite study in February 2010 which showed EF of 55% with mild inferior hypokinesia--There was no ischemia  . HERNIA REPAIR    . INGUINAL HERNIA REPAIR Left 05/31/2016   Procedure: LEFT INGUINAL HERNIA REPAIR WITH MESH;  Surgeon: Jackolyn Confer, MD;  Location: [facility];  Service: General;  Laterality: Left;  . INSERTION OF MESH Left 05/31/2016   Procedure: INSERTION OF MESH;  Surgeon: Jackolyn Confer, MD;  Location: [facility];  Service: General;  Laterality: Left;  . IR IMAGING GUIDED PORT INSERTION  01/05/2018  . TOTAL KNEE ARTHROPLASTY Left 09/29/2013   Procedure: TOTAL KNEE ARTHROPLASTY;  Surgeon: Alta Corning, MD;  Location: [facility];  Service: Orthopedics;  Laterality: Left;   Social History   Socioeconomic History  . Marital status: Married    Spouse name: Not on file  . Number of children: Not on file  . Years of education: Not on file  . Highest education level: Not on file  Occupational History  . Occupation: Retired  Scientific laboratory technician  . Financial resource strain: Not on file  . Food insecurity:    Worry: Not on file    Inability:  Not on file  . Transportation needs:    Medical: Not on file    Non-medical: Not on file  Tobacco Use  . Smoking status: Former Smoker    Packs/day: 1.00    Years: 30.00    Pack years: 30.00    Last attempt to quit: 09/22/1973    Years since quitting: 44.3  . Smokeless tobacco: Current User    Types: Chew  Substance and Sexual Activity  . Alcohol use: No  . Drug use: No  . Sexual activity: Not on file  Lifestyle  . Physical activity:    Days per week: Not on file    Minutes per session: Not on file  . Stress: Not on file  Relationships  . Social connections:    Talks on phone: Not on file    Gets together: Not on file    Attends  religious service: Not on file    Active member of club or organization: Not on file    Attends meetings of clubs or organizations: Not on file    Relationship status: Not on file  . Intimate partner violence:    Fear of current or ex partner: Not on file    Emotionally abused: Not on file    Physically abused: Not on file    Forced sexual activity: Not on file  Other Topics Concern  . Not on file  Social History Narrative   Pt lives with wife in [place].      Review of Systems: General: negative for chills, fever, night sweats or weight changes.  Cardiovascular: negative for chest pain, dyspnea on exertion, edema, orthopnea, palpitations, paroxysmal nocturnal dyspnea or shortness of breath Dermatological: negative for rash Respiratory: negative for cough or wheezing Urologic: negative for hematuria Abdominal: negative for nausea, vomiting, diarrhea, bright red blood per rectum, melena, or hematemesis Neurologic: negative for visual changes, syncope, or dizziness All other systems reviewed and are otherwise negative except as noted above.   Physical Exam:  Blood pressure 112/66, pulse 74, height 5\' 8"  (1.727 m), weight 143 lb 12.8 oz (65.2 kg).  General appearance: alert, cooperative and no distress Neck: no carotid bruit and no  JVD Lungs: clear to auscultation bilaterally Heart: regular rate and rhythm, S1, S2 normal, no murmur, click, rub or gallop Extremities: extremities normal, atraumatic, no cyanosis or edema Pulses: 2+ and symmetric Skin: Skin color, texture, turgor normal. No rashes or lesions Neurologic: Grossly normal  EKG NSR, 74 bpm -- personally reviewed   ASSESSMENT AND PLAN:   1. CAD: h/o CABG in 2007. Recent NST 06/2017 low risk for ischemia. He denies exertional CP and dyspnea. Continue medical therapy.   2. HTN: controlled on current regimen. No changes made today.   3. Medication Monitoring: Pt starting chemo for Non Hodgkin's Lymphoma.  He had an echocardiogram completed on December 31, 2017 for baseline assessment of EF prior to starting chemotherapy.  Echo showed normal left ventricular EF at 50 to 55% with grade 1 diastolic dysfunction.  Mild diffuse hypokinesis was noted.  Global longitudinal strain was measured at -13.2 (abnormal).  The patient reports undergoing 1 session of chemotherapy thus far.  The plan is to have chemotherapy every 3 weeks and a total of 6 treatments.  He will need routine echocardiograms for surveillance.From a cardiac standpoint, he denies any symptoms of heart failure.  He denies dyspnea, orthopnea, PND and lower extremity edema.  He notes no weight gain but there has been weight loss. He will notify us if any of these symptoms develop. He will f/u with Dr. Percival Spanish in December. Will defer to Dr. Percival Spanish timing of repeat echo for surveillance.   Follow-Up w/ Dr. Percival Spanish in 3 months.   Sherita Decoste Ladoris Gene, MHS [facility] 01/13/2018 2:02 PM

## 2018-01-14 ENCOUNTER — Telehealth: Payer: Self-pay | Admitting: Emergency Medicine

## 2018-01-14 NOTE — Telephone Encounter (Addendum)
Pt verbalized understanding of this   ----- Message from Owens Shark, NP sent at 01/13/2018  3:38 PM EDT ----- Please let him know the white count is better.  He can stop the antibiotic.  Follow-up as scheduled.

## 2018-01-18 ENCOUNTER — Other Ambulatory Visit: Payer: Self-pay | Admitting: Oncology

## 2018-01-19 ENCOUNTER — Other Ambulatory Visit: Payer: Self-pay | Admitting: Oncology

## 2018-01-21 DIAGNOSIS — R350 Frequency of micturition: Secondary | ICD-10-CM | POA: Diagnosis not present

## 2018-01-21 DIAGNOSIS — R3914 Feeling of incomplete bladder emptying: Secondary | ICD-10-CM | POA: Diagnosis not present

## 2018-01-21 DIAGNOSIS — N1330 Unspecified hydronephrosis: Secondary | ICD-10-CM | POA: Diagnosis not present

## 2018-01-22 ENCOUNTER — Encounter: Payer: Self-pay | Admitting: Nurse Practitioner

## 2018-01-22 ENCOUNTER — Inpatient Hospital Stay: Payer: Medicare Other

## 2018-01-22 ENCOUNTER — Other Ambulatory Visit: Payer: Self-pay | Admitting: Emergency Medicine

## 2018-01-22 ENCOUNTER — Encounter: Payer: Self-pay | Admitting: Oncology

## 2018-01-22 ENCOUNTER — Inpatient Hospital Stay (HOSPITAL_BASED_OUTPATIENT_CLINIC_OR_DEPARTMENT_OTHER): Payer: Medicare Other | Admitting: Nurse Practitioner

## 2018-01-22 VITALS — BP 118/62 | HR 65 | Temp 97.5°F | Resp 18 | Ht 68.0 in | Wt 142.0 lb

## 2018-01-22 VITALS — BP 121/67 | HR 74 | Temp 97.7°F | Resp 16

## 2018-01-22 DIAGNOSIS — Z5111 Encounter for antineoplastic chemotherapy: Secondary | ICD-10-CM | POA: Diagnosis not present

## 2018-01-22 DIAGNOSIS — C8590 Non-Hodgkin lymphoma, unspecified, unspecified site: Secondary | ICD-10-CM

## 2018-01-22 DIAGNOSIS — C8330 Diffuse large B-cell lymphoma, unspecified site: Secondary | ICD-10-CM

## 2018-01-22 DIAGNOSIS — D701 Agranulocytosis secondary to cancer chemotherapy: Secondary | ICD-10-CM

## 2018-01-22 DIAGNOSIS — N1330 Unspecified hydronephrosis: Secondary | ICD-10-CM | POA: Diagnosis not present

## 2018-01-22 DIAGNOSIS — Z5189 Encounter for other specified aftercare: Secondary | ICD-10-CM | POA: Diagnosis not present

## 2018-01-22 DIAGNOSIS — Z5112 Encounter for antineoplastic immunotherapy: Secondary | ICD-10-CM | POA: Diagnosis not present

## 2018-01-22 LAB — CBC WITH DIFFERENTIAL (CANCER CENTER ONLY)
BASOS ABS: 0.1 10*3/uL (ref 0.0–0.1)
BASOS PCT: 2 %
Eosinophils Absolute: 0 10*3/uL (ref 0.0–0.5)
Eosinophils Relative: 0 %
HEMATOCRIT: 31.7 % — AB (ref 38.4–49.9)
Hemoglobin: 10.2 g/dL — ABNORMAL LOW (ref 13.0–17.1)
Lymphocytes Relative: 27 %
Lymphs Abs: 2.4 10*3/uL (ref 0.9–3.3)
MCH: 27.3 pg (ref 27.2–33.4)
MCHC: 32.2 g/dL (ref 32.0–36.0)
MCV: 84.8 fL (ref 79.3–98.0)
Monocytes Absolute: 1.1 10*3/uL — ABNORMAL HIGH (ref 0.1–0.9)
Monocytes Relative: 12 %
NEUTROS ABS: 5.1 10*3/uL (ref 1.5–6.5)
NEUTROS PCT: 59 %
Platelet Count: 422 10*3/uL — ABNORMAL HIGH (ref 140–400)
RBC: 3.74 MIL/uL — AB (ref 4.20–5.82)
RDW: 16.6 % — ABNORMAL HIGH (ref 11.0–14.6)
WBC: 8.7 10*3/uL (ref 4.0–10.3)

## 2018-01-22 LAB — CMP (CANCER CENTER ONLY)
ALBUMIN: 3.3 g/dL — AB (ref 3.5–5.0)
ALT: 14 U/L (ref 0–44)
AST: 25 U/L (ref 15–41)
Alkaline Phosphatase: 97 U/L (ref 38–126)
Anion gap: 7 (ref 5–15)
BILIRUBIN TOTAL: 0.4 mg/dL (ref 0.3–1.2)
BUN: 12 mg/dL (ref 8–23)
CHLORIDE: 108 mmol/L (ref 98–111)
CO2: 25 mmol/L (ref 22–32)
Calcium: 9.2 mg/dL (ref 8.9–10.3)
Creatinine: 0.76 mg/dL (ref 0.61–1.24)
GFR, Est AFR Am: >60 mL/min (ref >60)
GFR, Estimated: >60 mL/min (ref >60)
GLUCOSE: 83 mg/dL (ref 70–99)
POTASSIUM: 4.3 mmol/L (ref 3.5–5.1)
Sodium: 140 mmol/L (ref 135–145)
TOTAL PROTEIN: 6.5 g/dL (ref 6.5–8.1)

## 2018-01-22 LAB — LACTATE DEHYDROGENASE: LDH: 139 U/L (ref 98–192)

## 2018-01-22 MED ORDER — ACETAMINOPHEN 325 MG PO TABS
650.0000 mg | ORAL_TABLET | Freq: Once | ORAL | Status: AC
  Administered 2018-01-22: 650 mg via ORAL

## 2018-01-22 MED ORDER — DIPHENHYDRAMINE HCL 25 MG PO CAPS
ORAL_CAPSULE | ORAL | Status: AC
  Filled 2018-01-22: qty 2

## 2018-01-22 MED ORDER — SODIUM CHLORIDE 0.9 % IV SOLN
375.0000 mg/m2 | Freq: Once | INTRAVENOUS | Status: AC
  Administered 2018-01-22: 700 mg via INTRAVENOUS
  Filled 2018-01-22: qty 50

## 2018-01-22 MED ORDER — SODIUM CHLORIDE 0.9 % IV SOLN
600.0000 mg/m2 | Freq: Once | INTRAVENOUS | Status: AC
  Administered 2018-01-22: 1100 mg via INTRAVENOUS
  Filled 2018-01-22: qty 55

## 2018-01-22 MED ORDER — PREDNISONE 20 MG PO TABS: 60.0000 mg | ORAL_TABLET | Freq: Every day | ORAL | 4 refills | Status: AC

## 2018-01-22 MED ORDER — DIPHENHYDRAMINE HCL 25 MG PO CAPS
50.0000 mg | ORAL_CAPSULE | Freq: Once | ORAL | Status: AC
  Administered 2018-01-22: 50 mg via ORAL

## 2018-01-22 MED ORDER — VINCRISTINE SULFATE CHEMO INJECTION 1 MG/ML
1.0000 mg | Freq: Once | INTRAVENOUS | Status: AC
  Administered 2018-01-22: 1 mg via INTRAVENOUS
  Filled 2018-01-22: qty 1

## 2018-01-22 MED ORDER — PALONOSETRON HCL INJECTION 0.25 MG/5ML
INTRAVENOUS | Status: AC
  Filled 2018-01-22: qty 5

## 2018-01-22 MED ORDER — ACETAMINOPHEN 325 MG PO TABS
ORAL_TABLET | ORAL | Status: AC
  Filled 2018-01-22: qty 2

## 2018-01-22 MED ORDER — SODIUM CHLORIDE 0.9 % IV SOLN
Freq: Once | INTRAVENOUS | Status: AC
  Administered 2018-01-22: 13:00:00 via INTRAVENOUS
  Filled 2018-01-22: qty 5

## 2018-01-22 MED ORDER — SODIUM CHLORIDE 0.9 % IV SOLN
Freq: Once | INTRAVENOUS | Status: AC
  Administered 2018-01-22: 13:00:00 via INTRAVENOUS
  Filled 2018-01-22: qty 250

## 2018-01-22 MED ORDER — SODIUM CHLORIDE 0.9% FLUSH
10.0000 mL | INTRAVENOUS | Status: DC | PRN
  Administered 2018-01-22: 10 mL
  Filled 2018-01-22: qty 10

## 2018-01-22 MED ORDER — PALONOSETRON HCL INJECTION 0.25 MG/5ML
0.2500 mg | Freq: Once | INTRAVENOUS | Status: AC
  Administered 2018-01-22: 0.25 mg via INTRAVENOUS

## 2018-01-22 MED ORDER — DOXORUBICIN HCL CHEMO IV INJECTION 2 MG/ML
40.0000 mg/m2 | Freq: Once | INTRAVENOUS | Status: AC
  Administered 2018-01-22: 74 mg via INTRAVENOUS
  Filled 2018-01-22: qty 37

## 2018-01-22 MED ORDER — HEPARIN SOD (PORK) LOCK FLUSH 100 UNIT/ML IV SOLN
500.0000 [IU] | Freq: Once | INTRAVENOUS | Status: AC | PRN
  Administered 2018-01-22: 500 [IU]
  Filled 2018-01-22: qty 5

## 2018-01-22 MED ORDER — SODIUM CHLORIDE 0.9 % IV SOLN: 375.0000 mg/m2 | Freq: Once | INTRAVENOUS | Status: DC

## 2018-01-22 NOTE — Progress Notes (Signed)
Met w/ pt to introduce myself as his Arboriculturist.  Unfortunately there aren't any foundations offering copay assistance for his Dx and the type of ins he has.  I offered the [employer], went over what it covers and gave him an expense sheet.  Pt would like to apply so he will bring his proof of income on 01/23/18.  He has my card for any questions or concerns he may have in the future.

## 2018-01-22 NOTE — Progress Notes (Addendum)
[facility] OFFICE PROGRESS NOTE   Diagnosis: Non-Hodgkin's lymphoma  INTERVAL HISTORY:   David Little returns as scheduled.  He completed cycle 1 CHOP/Rituxan 01/01/2018.  He developed severe neutropenia.  He had nausea which improved with Zofran.  He had a few mouth sores and completed a course of Diflucan.  At present he is feeling much better.  He is no longer having abdominal pain.  No nausea or vomiting.  Bowels moving regularly.  No neuropathy symptoms.  He has a good appetite.  Objective:  Vital signs in last 24 hours:  Blood pressure 118/62, pulse 65, temperature (!) 97.5 F (36.4 C), temperature source Oral, resp. rate 18, height 5\' 8"  (1.727 m), weight 142 lb (64.4 kg), SpO2 100 %.    HEENT: No thrush or ulcers. Resp: Lungs clear bilaterally. Cardio: Regular rate and rhythm. GI: Abdomen soft and nontender.  No hepatospleno megaly. Vascular: Trace edema at the lower legs bilaterally. Port-A-Cath without erythema.  Lab Results:  Lab Results  Component Value Date   WBC 8.7 01/22/2018   HGB 10.2 (L) 01/22/2018   HCT 31.7 (L) 01/22/2018   MCV 84.8 01/22/2018   PLT 422 (H) 01/22/2018   NEUTROABS 5.1 01/22/2018    Imaging:  No results found.  Medications: I have reviewed the patient's current medications.  Assessment/Plan: 1. Non-Hodgkin lymphoma - he completed fludarabine chemotherapy in February 2003. He remains in clinical remission.  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 Indeterminate lesion in the left liver, tubular structure in the right lower quadrant concerning for mucocele, indeterminate left lower lobe nodule  CT biopsy of retroperitoneal mass 12/25/2017- diffuse large  B-cell lymphoma, CD20 positive  Cycle 1 CHOP/Rituxan 01/01/2018  PET scan 01/06/2018- large hypermetabolic retroperitoneal and mesenteric mass; mild thoracic involvement including a hypermetabolic nodule in the suprasternal notch and a small hypermetabolic left periaortic lymph node; musculoskeletal involvement including the left T2 vertebrae, potentially the right distal medial sternocleidomastoid muscle and the right humeral shaft; approximately 5 hypermetabolic liver lesions; accentuated activity near the orifice of the appendix; other questionable activity along the gastrointestinal tract including scattered esophageal activity and in the proximal stomach without definite correlating CT abnormalities; left hydronephrosis and hydroureter extending down to the somewhat distended urinary bladder which demonstrates wall thickening and trabeculation.  Cycle 2 CHOP/Rituxan 01/22/2018 (Adriamycin and Cytoxan dose reduced due to severe neutropenia following cycle 1)  2. History of coronary artery disease - status post coronary artery bypass surgery. 3. Pneumococcal vaccine in April 2014 4. Left knee pain and swelling-? Left knee arthritis 5.Hypercalcemia 12/19/2017, status post Zometa 12/19/2017-improved 6.Elevated creatininesecondary to obstructing left peritoneal mass  CT 12/24/2017-left hydronephrosis secondary to obstructing retroperitoneal mass 7.Anorexia/weight loss/failure to thrive 8.Echocardiogram 12/31/2017- LVEF 50-55%, mild diffuse hypokinesis 9.   Neutropenia secondary to chemotherapy.  He will begin ciprofloxacin. 10.  Nausea/vomiting, question delayed nausea related to chemotherapy.  IV fluids, Zofran/dexamethasone; Zofran as needed at home. 11.  Constipation, question secondary to chemotherapy.  Begin MiraLAX. 12.  Oral candidiasis, question early mucositis.  He will complete a course of Diflucan.  Resolved.  Disposition: David Little appears stable.  He has completed 1 cycle  of CHOP/Rituxan.  Plan to proceed with cycle 2 today as scheduled.  Adriamycin and Cytoxan have been dose reduced due to severe neutropenia following  cycle 1.  He will again receive Neulasta support.    We reviewed the CBC from today.  Counts are adequate for treatment.    He will return for a nadir CBC on 02/02/2018.  He will return for lab, follow-up and cycle 3 CHOP/Rituxan in 3 weeks.  He will contact the office in the interim with any problems.  Patient seen with Dr. Benay Spice.   Ned Card ANP/GNP-BC   01/22/2018  12:30 PM  This was a shared visit with Ned Card.  David Little has an improved performance status following cycle 1 of CHOP/rituximab.  He will complete cycle 2 today.  Chemotherapy will be dose reduced secondary to the severe neutropenia following cycle 1.  David Manson, MD

## 2018-01-22 NOTE — Patient Instructions (Signed)
[facility] Discharge Instructions for Patients Receiving Chemotherapy  Today you received the following chemotherapy agents:  Rituxan   To help prevent nausea and vomiting after your treatment, we encourage you to take your nausea medication as prescribed.   If you develop nausea and vomiting that is not controlled by your nausea medication, call the clinic.   BELOW ARE SYMPTOMS THAT SHOULD BE REPORTED IMMEDIATELY:  *FEVER GREATER THAN 100.5 F  *CHILLS WITH OR WITHOUT FEVER  NAUSEA AND VOMITING THAT IS NOT CONTROLLED WITH YOUR NAUSEA MEDICATION  *UNUSUAL SHORTNESS OF BREATH  *UNUSUAL BRUISING OR BLEEDING  TENDERNESS IN MOUTH AND THROAT WITH OR WITHOUT PRESENCE OF ULCERS  *URINARY PROBLEMS  *BOWEL PROBLEMS  UNUSUAL RASH Items with * indicate a potential emergency and should be followed up as soon as possible.  Feel free to call the clinic should you have any questions or concerns. The clinic phone number is [phone].  Please show the CHEMO ALERT CARD at check-in to the Emergency Department and triage nurse.   

## 2018-01-23 ENCOUNTER — Inpatient Hospital Stay: Payer: Medicare Other

## 2018-01-23 ENCOUNTER — Encounter: Payer: Self-pay | Admitting: Oncology

## 2018-01-23 DIAGNOSIS — C8590 Non-Hodgkin lymphoma, unspecified, unspecified site: Secondary | ICD-10-CM

## 2018-01-23 DIAGNOSIS — Z5111 Encounter for antineoplastic chemotherapy: Secondary | ICD-10-CM | POA: Diagnosis not present

## 2018-01-23 DIAGNOSIS — C8330 Diffuse large B-cell lymphoma, unspecified site: Secondary | ICD-10-CM | POA: Diagnosis not present

## 2018-01-23 DIAGNOSIS — Z5189 Encounter for other specified aftercare: Secondary | ICD-10-CM | POA: Diagnosis not present

## 2018-01-23 DIAGNOSIS — D701 Agranulocytosis secondary to cancer chemotherapy: Secondary | ICD-10-CM | POA: Diagnosis not present

## 2018-01-23 DIAGNOSIS — N1330 Unspecified hydronephrosis: Secondary | ICD-10-CM | POA: Diagnosis not present

## 2018-01-23 DIAGNOSIS — Z5112 Encounter for antineoplastic immunotherapy: Secondary | ICD-10-CM | POA: Diagnosis not present

## 2018-01-23 MED ORDER — PEGFILGRASTIM-CBQV 6 MG/0.6ML ~~LOC~~ SOSY
6.0000 mg | PREFILLED_SYRINGE | Freq: Once | SUBCUTANEOUS | Status: AC
  Administered 2018-01-23: 6 mg via SUBCUTANEOUS

## 2018-01-23 MED ORDER — PEGFILGRASTIM-CBQV 6 MG/0.6ML ~~LOC~~ SOSY
PREFILLED_SYRINGE | SUBCUTANEOUS | Status: AC
  Filled 2018-01-23: qty 0.6

## 2018-01-23 NOTE — Progress Notes (Signed)
Pt is approved for the $400 [facility] grant.  °

## 2018-02-02 ENCOUNTER — Inpatient Hospital Stay: Payer: Medicare Other

## 2018-02-02 DIAGNOSIS — Z5189 Encounter for other specified aftercare: Secondary | ICD-10-CM | POA: Diagnosis not present

## 2018-02-02 DIAGNOSIS — Z5111 Encounter for antineoplastic chemotherapy: Secondary | ICD-10-CM | POA: Diagnosis not present

## 2018-02-02 DIAGNOSIS — C8330 Diffuse large B-cell lymphoma, unspecified site: Secondary | ICD-10-CM | POA: Diagnosis not present

## 2018-02-02 DIAGNOSIS — N1330 Unspecified hydronephrosis: Secondary | ICD-10-CM | POA: Diagnosis not present

## 2018-02-02 DIAGNOSIS — D701 Agranulocytosis secondary to cancer chemotherapy: Secondary | ICD-10-CM | POA: Diagnosis not present

## 2018-02-02 DIAGNOSIS — Z5112 Encounter for antineoplastic immunotherapy: Secondary | ICD-10-CM | POA: Diagnosis not present

## 2018-02-02 DIAGNOSIS — C8590 Non-Hodgkin lymphoma, unspecified, unspecified site: Secondary | ICD-10-CM

## 2018-02-02 LAB — CBC WITH DIFFERENTIAL (CANCER CENTER ONLY)
Basophils Absolute: 0.1 10*3/uL (ref 0.0–0.1)
Basophils Relative: 1 %
EOS PCT: 1 %
Eosinophils Absolute: 0.1 10*3/uL (ref 0.0–0.5)
HEMATOCRIT: 32.3 % — AB (ref 38.4–49.9)
Hemoglobin: 10.3 g/dL — ABNORMAL LOW (ref 13.0–17.1)
LYMPHS PCT: 14 %
Lymphs Abs: 1.6 10*3/uL (ref 0.9–3.3)
MCH: 27.1 pg — ABNORMAL LOW (ref 27.2–33.4)
MCHC: 31.9 g/dL — ABNORMAL LOW (ref 32.0–36.0)
MCV: 85 fL (ref 79.3–98.0)
MONO ABS: 0.9 10*3/uL (ref 0.1–0.9)
MONOS PCT: 8 %
NEUTROS ABS: 8.5 10*3/uL — AB (ref 1.5–6.5)
Neutrophils Relative %: 76 %
PLATELETS: 147 10*3/uL (ref 140–400)
RBC: 3.8 MIL/uL — ABNORMAL LOW (ref 4.20–5.82)
RDW: 17.7 % — AB (ref 11.0–14.6)
WBC Count: 11.1 10*3/uL — ABNORMAL HIGH (ref 4.0–10.3)

## 2018-02-03 ENCOUNTER — Telehealth: Payer: Self-pay | Admitting: *Deleted

## 2018-02-03 NOTE — Telephone Encounter (Signed)
Notified of message below

## 2018-02-03 NOTE — Telephone Encounter (Signed)
-----   Message from Owens Shark, NP sent at 02/02/2018  4:19 PM EDT ----- Please let him know blood counts look good.  Follow-up as scheduled.

## 2018-02-10 ENCOUNTER — Ambulatory Visit (INDEPENDENT_AMBULATORY_CARE_PROVIDER_SITE_OTHER): Payer: Medicare Other | Admitting: *Deleted

## 2018-02-10 ENCOUNTER — Other Ambulatory Visit: Payer: Self-pay | Admitting: Oncology

## 2018-02-10 ENCOUNTER — Encounter: Payer: Self-pay | Admitting: *Deleted

## 2018-02-10 VITALS — BP 103/66 | HR 74 | Ht 66.0 in | Wt 138.0 lb

## 2018-02-10 DIAGNOSIS — Z0000 Encounter for general adult medical examination without abnormal findings: Secondary | ICD-10-CM

## 2018-02-10 NOTE — Progress Notes (Addendum)
Subjective:   David Little is a 82 y.o. male who presents for a Medicare Annual Wellness Visit. David Little lives at home with his wife. He is currently undergoing chemotherapy with Dr Benay Spice for [place]. His wife takes care of the housework and prepares all meals. She helps him with his medications as well. He is able to ambulate on his own with a cane that he uses for balance. He also drives and takes care of his ADLs.  He enjoys watching television but does not have any other hobbies right now. He has 3 sons and 9 grandchildren and they live relatively close by. They get together each Sunday for dinner at his house.   Review of Systems    Patient reports that his overall health is worse compared to last year.  Cardiac Risk Factors include: advanced age (>80men, >38 women);hypertension;smoking/ tobacco exposure;sedentary lifestyle  Weight loss but appetite has improved.   Psych-states that he's not depressed but he does get a little worried. He sleeps well at night despite having to get up and urinate some.   All other systems negative       Current Medications (verified) Outpatient Encounter Medications as of 02/10/2018  Medication Sig  . acetaminophen (TYLENOL) 500 MG tablet Take 1,000 mg by mouth every 6 (six) hours as needed for mild pain or moderate pain.  Marland Kitchen aspirin EC 81 MG tablet Take 1 tablet (81 mg total) by mouth daily.  . ciprofloxacin (CIPRO) 500 MG tablet Take 1 tablet (500 mg total) by mouth 2 (two) times daily.  . fluconazole (DIFLUCAN) 100 MG tablet Take 1 tablet (100 mg total) by mouth daily.  . metoprolol tartrate (LOPRESSOR) 25 MG tablet Take 1 tablet (25 mg total) by mouth 2 (two) times daily.  . Multiple Vitamin (MULTIVITAMIN PO) Take 1 tablet by mouth daily.   . nitroGLYCERIN (NITROSTAT) 0.4 MG SL tablet Place 1 tablet (0.4 mg total) under the tongue as needed.  . ondansetron (ZOFRAN-ODT) 8 MG disintegrating tablet Take 1 tablet (8 mg total) by  mouth every 8 (eight) hours as needed for nausea or vomiting.  Vladimir Faster Glycol-Propyl Glycol (SYSTANE OP) Apply 1 drop to eye daily as needed (dry eyes).  . predniSONE (DELTASONE) 20 MG tablet 20 mg.  . prochlorperazine (COMPAZINE) 10 MG tablet Take 1 tablet (10 mg total) by mouth every 6 (six) hours as needed for nausea or vomiting.  . simvastatin (ZOCOR) 40 MG tablet Take 1 tablet (40 mg total) by mouth daily at 6 PM.  . tamsulosin (FLOMAX) 0.4 MG CAPS capsule Take 0.4 mg by mouth at bedtime.   No facility-administered encounter medications on file as of 02/10/2018.     Allergies (verified) Patient has no known allergies.   History: Past Medical History:  Diagnosis Date  . Coronary artery disease 2007   s/p CABG in 2007 for severe left main disease  . Hypercholesterolemia   . Hyperlipidemia   . Lymphoma ([facility]) 2003   without recurrence  . Myocardial infarction ([facility]) 2007  . Normal nuclear stress test March 2012   EF 55%. No ischemia. Mild inferior scar  . Skin cancer of face    Past Surgical History:  Procedure Laterality Date  . CORONARY ARTERY BYPASS GRAFT  2007   Had coronary artery bypass grafting for severe left main coronary artery stenosis--Had follow up stress cardiolite study in February 2010 which showed EF of 55% with mild inferior hypokinesia--There was no ischemia  . HERNIA REPAIR    .  INGUINAL HERNIA REPAIR Left 05/31/2016   Procedure: LEFT INGUINAL HERNIA REPAIR WITH MESH;  Surgeon: Jackolyn Confer, MD;  Location: [facility];  Service: General;  Laterality: Left;  . INSERTION OF MESH Left 05/31/2016   Procedure: INSERTION OF MESH;  Surgeon: Jackolyn Confer, MD;  Location: [facility];  Service: General;  Laterality: Left;  . IR IMAGING GUIDED PORT INSERTION  01/05/2018  . TOTAL KNEE ARTHROPLASTY Left 09/29/2013   Procedure: TOTAL KNEE ARTHROPLASTY;  Surgeon: Alta Corning, MD;  Location: [facility];  Service: Orthopedics;  Laterality: Left;   Family History  Problem Relation  Age of Onset  . Heart attack Brother 35       possible, not confirmed  . Unexplained death Mother 39  . Cancer Father 62   Social History   Socioeconomic History  . Marital status: Married    Spouse name: Not on file  . Number of children: 3  . Years of education: 72  . Highest education level: Some college, no degree  Occupational History  . Occupation: Retired    Comment: Theatre manager  Social Needs  . Financial resource strain: Not very hard  . Food insecurity:    Worry: Never true    Inability: Never true  . Transportation needs:    Medical: No    Non-medical: No  Tobacco Use  . Smoking status: Former Smoker    Packs/day: 1.00    Years: 30.00    Pack years: 30.00    Last attempt to quit: 09/22/1973    Years since quitting: 44.4  . Smokeless tobacco: Former Systems developer    Types: Chew  Substance and Sexual Activity  . Alcohol use: No  . Drug use: No  . Sexual activity: Not Currently  Lifestyle  . Physical activity:    Days per week: 0 days    Minutes per session: 0 min  . Stress: To some extent  Relationships  . Social connections:    Talks on phone: More than three times a week    Gets together: More than three times a week    Attends religious service: More than 4 times per year    Active member of club or organization: No    Attends meetings of clubs or organizations: Never    Relationship status: Married  Other Topics Concern  . Not on file  Social History Narrative   Pt lives with wife in [place].     Tobacco Use No.  Clinical Intake:     Pain : No/denies pain     Nutritional Status: BMI of 19-24  Normal Diabetes: No  How often do you need to have someone help you when you read instructions, pamphlets, or other written materials from your doctor or pharmacy?: 1 - Never What is the last grade level you completed in school?: 15 years  Interpreter Needed?: No  Information entered by :: Chong Sicilian, RN  Activities of Daily  Living In your present state of health, do you have any difficulty performing the following activities: 02/10/2018 01/05/2018  Hearing? Y N  Comment has some trouble at a distance. No trouble on the phone or close one on one conversation -  Vision? N N  Comment wears reading glasses. last exam was about a year or so ago -  Difficulty concentrating or making decisions? N N  Walking or climbing stairs? N Y  Dressing or bathing? N N  Doing errands, shopping? N -  Preparing Food and eating ?  N -  Using the Toilet? N -  In the past six months, have you accidently leaked urine? N -  Do you have problems with loss of bowel control? N -  Managing your Medications? Y -  Comment wife helps with meds -  Managing your Finances? Y -  Housekeeping or managing your Housekeeping? Y -  Some recent data might be hidden     Diet 3 homecooked meals a day. Appetite is improved.  Drinks mostly water and some soda. Drinks one Boost a day.  Exercise Current Exercise Habits: The patient does not participate in regular exercise at present, Exercise limited by: Other - see comments(metastatic cancer)    Depression Screen PHQ 2/9 Scores 02/10/2018 12/08/2017 09/26/2016  PHQ - 2 Score 1 0 0     Fall Risk Fall Risk  02/10/2018 12/08/2017 09/26/2016  Falls in the past year? No No No    Safety Is the patient's home free of loose throw rugs in walkways, pet beds, electrical cords, etc?   yes      Grab bars in the bathroom? yes      Walkin shower? no      Shower Seat? no      Handrails on the stairs?   yes      Adequate lighting?   yes  Patient Care Team: Chipper Herb, MD as PCP - General (Family Medicine)  Hospitalizations, surgeries, and ER visits in previous 12 months Undergoing treatment for lymphoma.   Objective:    Today's Vitals   02/10/18 1442  BP: 103/66  Pulse: 74  Weight: 138 lb (62.6 kg)  Height: 5\' 6"  (1.676 m)   Body mass index is 22.27 kg/m.  Advanced Directives 02/10/2018  01/09/2018 01/02/2018 05/28/2016 09/11/2015 09/08/2014 09/22/2013  Does Patient Have a Medical Advance Directive? Yes No No No No No Patient does not have advance directive;Patient would not like information  Type of Scientist, forensic Power of [place];Living will - - - - - -  Does patient want to make changes to medical advance directive? No - Patient declined - - - - - -  Copy of [place] in Chart? Yes - - - - - -  Would patient like information on creating a medical advance directive? No - Patient declined - No - Patient declined No - Patient declined No - patient declined information No - patient declined information -  Pre-existing out of facility DNR order (yellow form or pink MOST form) - - - - - - No    Hearing/Vision  No hearing or vision deficits noted during visit.   Cognitive Function: MMSE - Mini Mental State Exam 02/10/2018  Orientation to time 5  Orientation to Place 5  Registration 3  Attention/ Calculation 5  Recall 2  Language- name 2 objects 2  Language- repeat 1  Language- follow 3 step command 3  Language- read & follow direction 1  Write a sentence 1  Copy design 1  Total score 29       Normal Cognitive Function Screening: Yes    Immunizations and Health Maintenance Immunization History  Administered Date(s) Administered  . Influenza Split 02/04/2013  . Influenza, High Dose Seasonal PF 02/19/2014, 03/22/2015, 02/05/2017  . Influenza-Unspecified 01/26/2016  . Pneumococcal Polysaccharide-23 08/31/2012, 03/22/2015   Health Maintenance Due  Topic Date Due  . TETANUS/TDAP  04/23/1955  . PNA vac Low Risk Adult (2 of 2 - PCV13) 03/21/2016   Health Maintenance  Topic Date  Due  . TETANUS/TDAP  04/23/1955  . PNA vac Low Risk Adult (2 of 2 - PCV13) 03/21/2016  . INFLUENZA VACCINE  03/12/2018 (Originally 12/11/2017)        Assessment:   This is a routine wellness examination for David Little.      Plan:    Goals    . Exercise  150 min/wk Moderate Activity     Stay as active as possible right now. When you're feeling better after chemo, start increasing activity level slowly. Aim for 10 minutes of walking a day at first.     . Prevent falls     Move carefully and change positions slowly. Use your cane or walker at all times.        Health Maintenance Recommendations: Prevnar and Influenza vaccine recommended. He wants to talk with oncologist about timing of those in regard to chemotherapy.   Additional Screening Recommendations: Lung: Low Dose CT Chest recommended if Age 47-80 years, 30 pack-year currently smoking OR have quit w/in 15years. Patient does qualify. Hepatitis C Screening recommended: no  Keep f/u with Chipper Herb, MD and any other specialty appointments you may have Continue current medications Move carefully to avoid falls. Use assistive devices like a cane or walker if needed. Aim for at least 150 minutes of moderate activity a week. This can be chair exercises if necessary. Reading or puzzles are a good way to exercise your brain Stay connected with friends and family. Social connections are beneficial to your emotional and mental health.   I have personally reviewed and noted the following in the patient's chart:   . Medical and social history . Use of alcohol, tobacco or illicit drugs  . Current medications and supplements . Functional ability and status . Nutritional status . Physical activity . Advanced directives . List of other physicians . Hospitalizations, surgeries, and ER visits in previous 12 months . Vitals . Screenings to include cognitive, depression, and falls . Referrals and appointments  In addition, I have reviewed and discussed with patient certain preventive protocols, quality metrics, and best practice recommendations. A written personalized care plan for preventive services as well as general preventive health recommendations were provided to patient.      Chong Sicilian, RN   02/10/2018

## 2018-02-10 NOTE — Patient Instructions (Addendum)
  Mr. Alcindor , Thank you for taking time to come for your Medicare Wellness Visit. I appreciate your ongoing commitment to your health goals. Please review the following plan we discussed and let me know if I can assist you in the future.   These are the goals we discussed: Goals    . Exercise 150 min/wk Moderate Activity     Stay as active as possible right now. When you're feeling better after chemo, start increasing activity level slowly. Aim for 10 minutes of walking a day at first.     . Prevent falls     Move carefully and change positions slowly. Use your cane or walker at all times.       This is a list of the screening recommended for you and due dates:  Health Maintenance  Topic Date Due  . Tetanus Vaccine  04/23/1955  . Pneumonia vaccines (2 of 2 - PCV13) 03/21/2016  . Flu Shot  03/12/2018*  *Topic was postponed. The date shown is not the original due date.   Ask oncologist if they have a preference for when you take the flu vaccine and Prevnar (pneumonia vaccine)

## 2018-02-11 ENCOUNTER — Other Ambulatory Visit: Payer: Self-pay | Admitting: *Deleted

## 2018-02-11 DIAGNOSIS — C8590 Non-Hodgkin lymphoma, unspecified, unspecified site: Secondary | ICD-10-CM

## 2018-02-12 ENCOUNTER — Inpatient Hospital Stay: Payer: Medicare Other | Attending: Oncology

## 2018-02-12 ENCOUNTER — Inpatient Hospital Stay: Payer: Medicare Other

## 2018-02-12 ENCOUNTER — Inpatient Hospital Stay (HOSPITAL_BASED_OUTPATIENT_CLINIC_OR_DEPARTMENT_OTHER): Payer: Medicare Other | Admitting: Oncology

## 2018-02-12 VITALS — BP 95/68 | HR 80 | Temp 97.7°F | Resp 17 | Ht 66.0 in | Wt 139.6 lb

## 2018-02-12 VITALS — BP 115/67 | HR 73 | Temp 98.0°F | Resp 20

## 2018-02-12 DIAGNOSIS — J181 Lobar pneumonia, unspecified organism: Secondary | ICD-10-CM | POA: Insufficient documentation

## 2018-02-12 DIAGNOSIS — Z5189 Encounter for other specified aftercare: Secondary | ICD-10-CM | POA: Insufficient documentation

## 2018-02-12 DIAGNOSIS — Z23 Encounter for immunization: Secondary | ICD-10-CM | POA: Diagnosis not present

## 2018-02-12 DIAGNOSIS — C8300 Small cell B-cell lymphoma, unspecified site: Secondary | ICD-10-CM | POA: Diagnosis present

## 2018-02-12 DIAGNOSIS — R5383 Other fatigue: Secondary | ICD-10-CM

## 2018-02-12 DIAGNOSIS — Z79899 Other long term (current) drug therapy: Secondary | ICD-10-CM | POA: Insufficient documentation

## 2018-02-12 DIAGNOSIS — C8590 Non-Hodgkin lymphoma, unspecified, unspecified site: Secondary | ICD-10-CM

## 2018-02-12 DIAGNOSIS — R5381 Other malaise: Secondary | ICD-10-CM

## 2018-02-12 DIAGNOSIS — D701 Agranulocytosis secondary to cancer chemotherapy: Secondary | ICD-10-CM | POA: Insufficient documentation

## 2018-02-12 DIAGNOSIS — R0600 Dyspnea, unspecified: Secondary | ICD-10-CM | POA: Insufficient documentation

## 2018-02-12 DIAGNOSIS — C8330 Diffuse large B-cell lymphoma, unspecified site: Secondary | ICD-10-CM

## 2018-02-12 DIAGNOSIS — Z5111 Encounter for antineoplastic chemotherapy: Secondary | ICD-10-CM | POA: Insufficient documentation

## 2018-02-12 DIAGNOSIS — Z5112 Encounter for antineoplastic immunotherapy: Secondary | ICD-10-CM | POA: Diagnosis not present

## 2018-02-12 LAB — CBC WITH DIFFERENTIAL (CANCER CENTER ONLY)
Basophils Absolute: 0.1 10*3/uL (ref 0.0–0.1)
Basophils Relative: 1 %
EOS PCT: 0 %
Eosinophils Absolute: 0 10*3/uL (ref 0.0–0.5)
HCT: 31.4 % — ABNORMAL LOW (ref 38.4–49.9)
Hemoglobin: 10.5 g/dL — ABNORMAL LOW (ref 13.0–17.1)
LYMPHS ABS: 1.4 10*3/uL (ref 0.9–3.3)
LYMPHS PCT: 12 %
MCH: 28.1 pg (ref 27.2–33.4)
MCHC: 33.3 g/dL (ref 32.0–36.0)
MCV: 84.4 fL (ref 79.3–98.0)
MONO ABS: 1 10*3/uL — AB (ref 0.1–0.9)
MONOS PCT: 9 %
Neutro Abs: 8.6 10*3/uL — ABNORMAL HIGH (ref 1.5–6.5)
Neutrophils Relative %: 78 %
PLATELETS: 316 10*3/uL (ref 140–400)
RBC: 3.73 MIL/uL — AB (ref 4.20–5.82)
RDW: 20.6 % — ABNORMAL HIGH (ref 11.0–14.6)
WBC: 11.2 10*3/uL — AB (ref 4.0–10.3)

## 2018-02-12 LAB — CMP (CANCER CENTER ONLY)
ALK PHOS: 182 U/L — AB (ref 38–126)
ALT: 18 U/L (ref 0–44)
AST: 23 U/L (ref 15–41)
Albumin: 3.3 g/dL — ABNORMAL LOW (ref 3.5–5.0)
Anion gap: 7 (ref 5–15)
BUN: 12 mg/dL (ref 8–23)
CHLORIDE: 111 mmol/L (ref 98–111)
CO2: 23 mmol/L (ref 22–32)
Calcium: 8.8 mg/dL — ABNORMAL LOW (ref 8.9–10.3)
Creatinine: 0.78 mg/dL (ref 0.61–1.24)
GLUCOSE: 124 mg/dL — AB (ref 70–99)
POTASSIUM: 3.8 mmol/L (ref 3.5–5.1)
Sodium: 141 mmol/L (ref 135–145)
Total Bilirubin: 0.3 mg/dL (ref 0.3–1.2)
Total Protein: 6.5 g/dL (ref 6.5–8.1)

## 2018-02-12 MED ORDER — SODIUM CHLORIDE 0.9 % IV SOLN
Freq: Once | INTRAVENOUS | Status: AC
  Administered 2018-02-12: 13:00:00 via INTRAVENOUS
  Filled 2018-02-12: qty 250

## 2018-02-12 MED ORDER — ACETAMINOPHEN 325 MG PO TABS
ORAL_TABLET | ORAL | Status: AC
  Filled 2018-02-12: qty 2

## 2018-02-12 MED ORDER — PALONOSETRON HCL INJECTION 0.25 MG/5ML
0.2500 mg | Freq: Once | INTRAVENOUS | Status: AC
  Administered 2018-02-12: 0.25 mg via INTRAVENOUS

## 2018-02-12 MED ORDER — SODIUM CHLORIDE 0.9 % IV SOLN
600.0000 mg/m2 | Freq: Once | INTRAVENOUS | Status: AC
  Administered 2018-02-12: 1100 mg via INTRAVENOUS
  Filled 2018-02-12: qty 55

## 2018-02-12 MED ORDER — VINCRISTINE SULFATE CHEMO INJECTION 1 MG/ML
1.0000 mg | Freq: Once | INTRAVENOUS | Status: AC
  Administered 2018-02-12: 1 mg via INTRAVENOUS
  Filled 2018-02-12: qty 1

## 2018-02-12 MED ORDER — PALONOSETRON HCL INJECTION 0.25 MG/5ML
INTRAVENOUS | Status: AC
  Filled 2018-02-12: qty 5

## 2018-02-12 MED ORDER — SODIUM CHLORIDE 0.9 % IV SOLN
Freq: Once | INTRAVENOUS | Status: AC
  Administered 2018-02-12: 13:00:00 via INTRAVENOUS
  Filled 2018-02-12: qty 5

## 2018-02-12 MED ORDER — HEPARIN SOD (PORK) LOCK FLUSH 100 UNIT/ML IV SOLN
500.0000 [IU] | Freq: Once | INTRAVENOUS | Status: AC | PRN
  Administered 2018-02-12: 500 [IU]
  Filled 2018-02-12: qty 5

## 2018-02-12 MED ORDER — DIPHENHYDRAMINE HCL 25 MG PO CAPS
ORAL_CAPSULE | ORAL | Status: AC
  Filled 2018-02-12: qty 2

## 2018-02-12 MED ORDER — INFLUENZA VAC SPLIT QUAD 0.5 ML IM SUSY
PREFILLED_SYRINGE | INTRAMUSCULAR | Status: AC
  Filled 2018-02-12: qty 0.5

## 2018-02-12 MED ORDER — ACETAMINOPHEN 325 MG PO TABS
650.0000 mg | ORAL_TABLET | Freq: Once | ORAL | Status: AC
  Administered 2018-02-12: 650 mg via ORAL

## 2018-02-12 MED ORDER — SODIUM CHLORIDE 0.9% FLUSH
10.0000 mL | INTRAVENOUS | Status: DC | PRN
  Administered 2018-02-12: 10 mL
  Filled 2018-02-12: qty 10

## 2018-02-12 MED ORDER — INFLUENZA VAC SPLIT QUAD 0.5 ML IM SUSY
0.5000 mL | PREFILLED_SYRINGE | Freq: Once | INTRAMUSCULAR | Status: AC
  Administered 2018-02-12: 0.5 mL via INTRAMUSCULAR

## 2018-02-12 MED ORDER — DOXORUBICIN HCL CHEMO IV INJECTION 2 MG/ML
40.0000 mg/m2 | Freq: Once | INTRAVENOUS | Status: AC
  Administered 2018-02-12: 74 mg via INTRAVENOUS
  Filled 2018-02-12: qty 37

## 2018-02-12 MED ORDER — DIPHENHYDRAMINE HCL 25 MG PO CAPS
50.0000 mg | ORAL_CAPSULE | Freq: Once | ORAL | Status: AC
  Administered 2018-02-12: 50 mg via ORAL

## 2018-02-12 MED ORDER — SODIUM CHLORIDE 0.9 % IV SOLN
375.0000 mg/m2 | Freq: Once | INTRAVENOUS | Status: AC
  Administered 2018-02-12: 700 mg via INTRAVENOUS
  Filled 2018-02-12: qty 50

## 2018-02-12 NOTE — Progress Notes (Signed)
[facility] OFFICE PROGRESS NOTE   Diagnosis: Non-Hodgkin's lymphoma  INTERVAL HISTORY:   [name] returns as scheduled.  He completed another cycle of CHOP/rituximab on 01/22/2018.  He received Neulasta on 01/23/2018.  No nausea, mouth sores, neuropathy symptoms, or symptoms of an allergic reaction.  He reports malaise.  He fatigues easily.  The abdominal pain is improved.  Objective:  Vital signs in last 24 hours:  Blood pressure 95/68, pulse 80, temperature 97.7 F (36.5 C), temperature source Oral, resp. rate 17, height 5\' 6"  (1.676 m), weight 139 lb 9.6 oz (63.3 kg), SpO2 100 %.    HEENT: No thrush or ulcers Resp: Lungs clear bilaterally Cardio: Regular rate and rhythm GI: No hepatosplenomegaly, soft, nontender Vascular: No leg edema    Portacath/PICC-without erythema  Lab Results:  Lab Results  Component Value Date   WBC 11.2 (H) 02/12/2018   HGB 10.5 (L) 02/12/2018   HCT 31.4 (L) 02/12/2018   MCV 84.4 02/12/2018   PLT 316 02/12/2018   NEUTROABS 8.6 (H) 02/12/2018    CMP  Lab Results  Component Value Date   NA 140 01/22/2018   K 4.3 01/22/2018   CL 108 01/22/2018   CO2 25 01/22/2018   GLUCOSE 83 01/22/2018   BUN 12 01/22/2018   CREATININE 0.76 01/22/2018   CALCIUM 9.2 01/22/2018   PROT 6.5 01/22/2018   ALBUMIN 3.3 (L) 01/22/2018   AST 25 01/22/2018   ALT 14 01/22/2018   ALKPHOS 97 01/22/2018   BILITOT 0.4 01/22/2018   GFRNONAA >60 01/22/2018   GFRAA >60 01/22/2018     Medications: I have reviewed the patient's current medications.   Assessment/Plan: 1. Non-Hodgkin lymphoma - he completed fludarabine chemotherapy in February 2003. He remains in clinical remission.  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 Indeterminate lesion in the left liver, tubular structure in the right lower quadrant concerning for mucocele, indeterminate left lower lobe nodule  CT biopsy of retroperitoneal mass 12/25/2017-diffuse large B-cell lymphoma, CD20 positive  Cycle 1 CHOP/Rituxan 01/01/2018  PET scan 01/06/2018- large hypermetabolic retroperitoneal and mesenteric mass; mild thoracic involvement including a hypermetabolic nodule in the suprasternal notch and a small hypermetabolic left periaortic lymph node;musculoskeletal involvement including the left T2 vertebrae, potentially the right distal medial sternocleidomastoid muscle and the right humeral shaft; approximately 5 hypermetabolic liver lesions; accentuated activity near the orifice of the appendix; other questionable activity along the gastrointestinal tract including scattered esophageal activity and in the proximal stomach without definite correlating CT abnormalities; left hydronephrosis and hydroureter extending down to the somewhat distended urinary bladder which demonstrates wall thickening and trabeculation.  Cycle 2 CHOP/Rituxan 01/22/2018 (Adriamycin and Cytoxan dose reduced due to severe neutropenia following cycle 1)  Cycle 3 CHOP/rituximab 02/12/2018  2. History of coronary artery disease - status post coronary artery bypass surgery. 3. Pneumococcal vaccine in April 2014 4. Left knee pain and swelling-? Left knee arthritis 5.Hypercalcemia 12/19/2017, status post Zometa 12/19/2017-improved 6.Elevated creatininesecondary to obstructing left peritoneal mass  CT 12/24/2017-left hydronephrosis secondary to obstructing retroperitoneal mass 7.Anorexia/weight loss/failure to thrive 8.Echocardiogram 12/31/2017-LVEF 50-55%, mild diffuse hypokinesis 9.Neutropenia secondary to chemotherapy. He will begin ciprofloxacin. 10.Nausea/vomiting, question delayed nausea related to chemotherapy. IV  fluids, Zofran/dexamethasone; Zofran as needed at home. 11.Constipation, question secondary to chemotherapy. Begin MiraLAX. 12.Oral candidiasis, question early  mucositis. He completed a course of Diflucan.  Resolved.    Disposition: David Little has completed 2 cycles of CHOP/rituximab.  He has tolerated the chemotherapy well and there has been marked clinical improvement.  He will complete cycle 3 today.  He will return for an office visit and cycle 4 chemotherapy in 3 weeks.  He will undergo a restaging PET scan after cycle 4.  15 minutes were spent with the patient today.  The majority of the time was used for counseling and coordination of care.  Betsy Coder, MD  02/12/2018  10:55 AM

## 2018-02-12 NOTE — Patient Instructions (Signed)
[facility] Discharge Instructions for Patients Receiving Chemotherapy  Today you received the following chemotherapy agents: Rituximab (Rituxan), Doxorubicin (Adriamycin), Vincristine (Oncovin), and Cyclophosphamide (Cytoxan)  To help prevent nausea and vomiting after your treatment, we encourage you to take your nausea medication as prescribed. Received Aloxi during treatment today-->Take Compazine (not Zofran) for the next 3 days as needed.    If you develop nausea and vomiting that is not controlled by your nausea medication, call the clinic.   BELOW ARE SYMPTOMS THAT SHOULD BE REPORTED IMMEDIATELY:  *FEVER GREATER THAN 100.5 F  *CHILLS WITH OR WITHOUT FEVER  NAUSEA AND VOMITING THAT IS NOT CONTROLLED WITH YOUR NAUSEA MEDICATION  *UNUSUAL SHORTNESS OF BREATH  *UNUSUAL BRUISING OR BLEEDING  TENDERNESS IN MOUTH AND THROAT WITH OR WITHOUT PRESENCE OF ULCERS  *URINARY PROBLEMS  *BOWEL PROBLEMS  UNUSUAL RASH Items with * indicate a potential emergency and should be followed up as soon as possible.  Feel free to call the clinic should you have any questions or concerns. The clinic phone number is [phone].  Please show the [facility] at check-in to the Emergency Department and triage nurse.

## 2018-02-13 ENCOUNTER — Inpatient Hospital Stay: Payer: Medicare Other

## 2018-02-13 VITALS — BP 125/58 | HR 80 | Temp 98.0°F | Resp 18

## 2018-02-13 DIAGNOSIS — C8590 Non-Hodgkin lymphoma, unspecified, unspecified site: Secondary | ICD-10-CM

## 2018-02-13 DIAGNOSIS — Z5111 Encounter for antineoplastic chemotherapy: Secondary | ICD-10-CM | POA: Diagnosis not present

## 2018-02-13 DIAGNOSIS — Z5112 Encounter for antineoplastic immunotherapy: Secondary | ICD-10-CM | POA: Diagnosis not present

## 2018-02-13 DIAGNOSIS — Z79899 Other long term (current) drug therapy: Secondary | ICD-10-CM | POA: Diagnosis not present

## 2018-02-13 DIAGNOSIS — C8330 Diffuse large B-cell lymphoma, unspecified site: Secondary | ICD-10-CM | POA: Diagnosis not present

## 2018-02-13 DIAGNOSIS — J181 Lobar pneumonia, unspecified organism: Secondary | ICD-10-CM | POA: Diagnosis not present

## 2018-02-13 DIAGNOSIS — Z23 Encounter for immunization: Secondary | ICD-10-CM | POA: Diagnosis not present

## 2018-02-13 DIAGNOSIS — R0600 Dyspnea, unspecified: Secondary | ICD-10-CM | POA: Diagnosis not present

## 2018-02-13 DIAGNOSIS — Z5189 Encounter for other specified aftercare: Secondary | ICD-10-CM | POA: Diagnosis not present

## 2018-02-13 DIAGNOSIS — D701 Agranulocytosis secondary to cancer chemotherapy: Secondary | ICD-10-CM | POA: Diagnosis not present

## 2018-02-13 MED ORDER — PEGFILGRASTIM-CBQV 6 MG/0.6ML ~~LOC~~ SOSY
PREFILLED_SYRINGE | SUBCUTANEOUS | Status: AC
  Filled 2018-02-13: qty 0.6

## 2018-02-13 MED ORDER — PEGFILGRASTIM-CBQV 6 MG/0.6ML ~~LOC~~ SOSY
6.0000 mg | PREFILLED_SYRINGE | Freq: Once | SUBCUTANEOUS | Status: AC
  Administered 2018-02-13: 6 mg via SUBCUTANEOUS

## 2018-02-20 ENCOUNTER — Other Ambulatory Visit: Payer: Self-pay | Admitting: Emergency Medicine

## 2018-02-20 ENCOUNTER — Telehealth: Payer: Self-pay | Admitting: Emergency Medicine

## 2018-02-20 MED ORDER — TRAMADOL HCL 50 MG PO TABS: 50.0000 mg | ORAL_TABLET | Freq: Four times a day (QID) | ORAL | 0 refills | Status: AC | PRN

## 2018-02-20 NOTE — Telephone Encounter (Signed)
Patients wife called. States pt has bilat shoulder pain rates at a 8. Tylenol does not work. Tramadol called in for the patient. Pt made aware and knows to call back Monday if the pain is not any better.

## 2018-02-27 ENCOUNTER — Inpatient Hospital Stay (HOSPITAL_BASED_OUTPATIENT_CLINIC_OR_DEPARTMENT_OTHER): Payer: Medicare Other | Admitting: Nurse Practitioner

## 2018-02-27 ENCOUNTER — Inpatient Hospital Stay: Payer: Medicare Other

## 2018-02-27 ENCOUNTER — Encounter: Payer: Self-pay | Admitting: Nurse Practitioner

## 2018-02-27 VITALS — BP 102/68 | HR 70 | Temp 97.5°F | Resp 17 | Ht 66.0 in | Wt 133.8 lb

## 2018-02-27 DIAGNOSIS — J189 Pneumonia, unspecified organism: Secondary | ICD-10-CM | POA: Diagnosis not present

## 2018-02-27 DIAGNOSIS — M25519 Pain in unspecified shoulder: Secondary | ICD-10-CM | POA: Diagnosis not present

## 2018-02-27 DIAGNOSIS — R0600 Dyspnea, unspecified: Secondary | ICD-10-CM | POA: Diagnosis not present

## 2018-02-27 DIAGNOSIS — Z5112 Encounter for antineoplastic immunotherapy: Secondary | ICD-10-CM | POA: Diagnosis not present

## 2018-02-27 DIAGNOSIS — Z5189 Encounter for other specified aftercare: Secondary | ICD-10-CM | POA: Diagnosis not present

## 2018-02-27 DIAGNOSIS — Z5111 Encounter for antineoplastic chemotherapy: Secondary | ICD-10-CM | POA: Diagnosis not present

## 2018-02-27 DIAGNOSIS — D701 Agranulocytosis secondary to cancer chemotherapy: Secondary | ICD-10-CM | POA: Diagnosis not present

## 2018-02-27 DIAGNOSIS — Z23 Encounter for immunization: Secondary | ICD-10-CM | POA: Diagnosis not present

## 2018-02-27 DIAGNOSIS — C8330 Diffuse large B-cell lymphoma, unspecified site: Secondary | ICD-10-CM

## 2018-02-27 DIAGNOSIS — Z79899 Other long term (current) drug therapy: Secondary | ICD-10-CM | POA: Diagnosis not present

## 2018-02-27 DIAGNOSIS — R0609 Other forms of dyspnea: Secondary | ICD-10-CM

## 2018-02-27 DIAGNOSIS — R531 Weakness: Secondary | ICD-10-CM

## 2018-02-27 DIAGNOSIS — M545 Low back pain: Secondary | ICD-10-CM

## 2018-02-27 DIAGNOSIS — C8590 Non-Hodgkin lymphoma, unspecified, unspecified site: Secondary | ICD-10-CM

## 2018-02-27 DIAGNOSIS — J181 Lobar pneumonia, unspecified organism: Secondary | ICD-10-CM | POA: Diagnosis not present

## 2018-02-27 LAB — CBC WITH DIFFERENTIAL (CANCER CENTER ONLY)
ABS IMMATURE GRANULOCYTES: 0.12 10*3/uL — AB (ref 0.00–0.07)
BASOS ABS: 0.1 10*3/uL (ref 0.0–0.1)
BASOS PCT: 1 %
EOS ABS: 0.1 10*3/uL (ref 0.0–0.5)
Eosinophils Relative: 1 %
HCT: 30.6 % — ABNORMAL LOW (ref 39.0–52.0)
HEMOGLOBIN: 9.8 g/dL — AB (ref 13.0–17.0)
IMMATURE GRANULOCYTES: 1 %
LYMPHS ABS: 1.2 10*3/uL (ref 0.7–4.0)
Lymphocytes Relative: 11 %
MCH: 27.8 pg (ref 26.0–34.0)
MCHC: 32 g/dL (ref 30.0–36.0)
MCV: 86.9 fL (ref 80.0–100.0)
Monocytes Absolute: 0.8 10*3/uL (ref 0.1–1.0)
Monocytes Relative: 7 %
NEUTROS PCT: 79 %
NRBC: 0 % (ref 0.0–0.2)
Neutro Abs: 8.6 10*3/uL — ABNORMAL HIGH (ref 1.7–7.7)
PLATELETS: 246 10*3/uL (ref 150–400)
RBC: 3.52 MIL/uL — ABNORMAL LOW (ref 4.22–5.81)
RDW: 18.1 % — ABNORMAL HIGH (ref 11.5–15.5)
WBC Count: 10.9 10*3/uL — ABNORMAL HIGH (ref 4.0–10.5)

## 2018-02-27 LAB — CMP (CANCER CENTER ONLY)
ALK PHOS: 253 U/L — AB (ref 38–126)
ALT: 20 U/L (ref 0–44)
ANION GAP: 9 (ref 5–15)
AST: 27 U/L (ref 15–41)
Albumin: 2.7 g/dL — ABNORMAL LOW (ref 3.5–5.0)
BUN: 17 mg/dL (ref 8–23)
CO2: 25 mmol/L (ref 22–32)
Calcium: 8.8 mg/dL — ABNORMAL LOW (ref 8.9–10.3)
Chloride: 104 mmol/L (ref 98–111)
Creatinine: 0.81 mg/dL (ref 0.61–1.24)
GFR, Estimated: >60 mL/min (ref >60)
Glucose, Bld: 117 mg/dL — ABNORMAL HIGH (ref 70–99)
Potassium: 4.4 mmol/L (ref 3.5–5.1)
SODIUM: 138 mmol/L (ref 135–145)
TOTAL PROTEIN: 6.2 g/dL — AB (ref 6.5–8.1)
Total Bilirubin: 0.4 mg/dL (ref 0.3–1.2)

## 2018-02-27 MED ORDER — SODIUM CHLORIDE 0.9 % IV SOLN
INTRAVENOUS | Status: DC
  Administered 2018-02-27: 15:00:00 via INTRAVENOUS
  Filled 2018-02-27 (×2): qty 250

## 2018-02-27 NOTE — Progress Notes (Signed)
[facility] OFFICE PROGRESS NOTE   Diagnosis: Non-Hodgkin's lymphoma  INTERVAL HISTORY:   David Little returns prior to scheduled follow-up for evaluation of weakness.  He completed cycle 3 CHOP/Rituxan 02/12/2018.  He reports progressive weakness over the past 1 week.  He is able to perform ADLs without difficulty.  No fever or chills.  No nausea or vomiting.  No diarrhea.  No mouth sores.  He is not lightheaded or dizzy.  He notes his appetite is poor.  His wife thinks fluid intake is suboptimal.  He continues to have intermittent shoulder and upper back pain.  He takes Tylenol and tramadol as needed.  Objective:  Vital signs in last 24 hours:  Blood pressure 102/68, pulse 70, temperature (!) 97.5 F (36.4 C), temperature source Oral, resp. rate 17, height 5\' 6"  (1.676 m), weight 133 lb 12.8 oz (60.7 kg), SpO2 95 %.    HEENT: No thrush or ulcers.  Mucous membranes are moist. Resp: Lungs clear bilaterally. Cardio: Regular rate and rhythm. GI: Abdomen soft and nontender.  No hepatosplenomegaly. Vascular: No leg edema. Neuro: Alert and oriented. Skin: Decreased skin turgor. Port-A-Cath without erythema.   Lab Results:  Lab Results  Component Value Date   WBC 10.9 (H) 02/27/2018   HGB 9.8 (L) 02/27/2018   HCT 30.6 (L) 02/27/2018   MCV 86.9 02/27/2018   PLT 246 02/27/2018   NEUTROABS 8.6 (H) 02/27/2018    Imaging:  No results found.  Medications: I have reviewed the patient's current medications.  Assessment/Plan: 1. Non-Hodgkin lymphoma - he completed fludarabine chemotherapy in February 2003. He remains in clinical remission.  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 Indeterminate  lesion in the left liver, tubular structure in the right lower quadrant concerning for mucocele, indeterminate left lower lobe nodule  CT biopsy of retroperitoneal mass 12/25/2017-diffuse large B-cell lymphoma, CD20 positive  Cycle 1 CHOP/Rituxan 01/01/2018  PET scan 4/48/1856-DJSHF hypermetabolic retroperitoneal and mesenteric mass; mild thoracic involvement including a hypermetabolic nodule in the suprasternal notch and a small hypermetabolic left periaortic lymph node;musculoskeletal involvement including the left T2 vertebrae, potentially the right distal medial sternocleidomastoid muscle and the right humeral shaft; approximately 5 hypermetabolic liver lesions; accentuated activity near the orifice of the appendix; other questionable activity along the gastrointestinal tract including scattered esophageal activity and in the proximal stomach without definite correlating CT abnormalities; left hydronephrosis and hydroureter extending down to the somewhat distended urinary bladder which demonstrates wall thickening and trabeculation.  Cycle 2 CHOP/Rituxan 01/22/2018 (Adriamycin and Cytoxan dose reduced due to severe neutropenia following cycle 1)  Cycle 3 CHOP/rituximab 02/12/2018  2. History of coronary artery disease - status post coronary artery bypass surgery. 3. Pneumococcal vaccine in April 2014 4. Left knee pain and swelling-? Left knee arthritis 5.Hypercalcemia 12/19/2017, status post Zometa 12/19/2017-improved 6.Elevated creatininesecondary to obstructing left peritoneal mass  CT 12/24/2017-left hydronephrosis secondary to obstructing retroperitoneal mass 7.Anorexia/weight loss/failure to thrive 8.Echocardiogram 12/31/2017-LVEF 50-55%, mild diffuse hypokinesis 9.Neutropenia secondary to chemotherapy. He will begin ciprofloxacin. 10.Nausea/vomiting, question delayed nausea related to chemotherapy. IV fluids, Zofran/dexamethasone; Zofran as needed at  home. 11.Constipation, question secondary to chemotherapy. Begin MiraLAX. 12.Oral candidiasis, question early mucositis. He completed a course of Diflucan.Resolved.   Disposition: David Little appears stable.  He has completed 3 cycles of CHOP/Rituxan.  He presents  today with progressive weakness over the past 1 week.  Oral intake has been poor.  He may be mildly dehydrated.  He will receive a liter of IV fluids in the office today.  He will return for lab, follow-up and the next cycle of chemotherapy as scheduled on 03/05/2018.  He will contact the office in the interim with any problems.    Ned Card ANP/GNP-BC   02/27/2018  3:14 PM

## 2018-02-27 NOTE — Patient Instructions (Signed)
Dehydration, Adult Dehydration is when there is not enough fluid or water in your body. This happens when you lose more fluids than you take in. Dehydration can range from mild to very bad. It should be treated right away to keep it from getting very bad. Symptoms of mild dehydration may include:  Thirst.  Dry lips.  Slightly dry mouth.  Dry, warm skin.  Dizziness. Symptoms of moderate dehydration may include:  Very dry mouth.  Muscle cramps.  Dark pee (urine). Pee may be the color of tea.  Your body making less pee.  Your eyes making fewer tears.  Heartbeat that is uneven or faster than normal (palpitations).  Headache.  Light-headedness, especially when you stand up from sitting.  Fainting (syncope). Symptoms of very bad dehydration may include:  Changes in skin, such as: ? Cold and clammy skin. ? Blotchy (mottled) or pale skin. ? Skin that does not quickly return to normal after being lightly pinched and let go (poor skin turgor).  Changes in body fluids, such as: ? Feeling very thirsty. ? Your eyes making fewer tears. ? Not sweating when body temperature is high, such as in hot weather. ? Your body making very little pee.  Changes in vital signs, such as: ? Weak pulse. ? Pulse that is more than 100 beats a minute when you are sitting still. ? Fast breathing. ? Low blood pressure.  Other changes, such as: ? Sunken eyes. ? Cold hands and feet. ? Confusion. ? Lack of energy (lethargy). ? Trouble waking up from sleep. ? Short-term weight loss. ? Unconsciousness. Follow these instructions at home:  If told by your doctor, drink an ORS: ? Make an ORS by using instructions on the package. ? Start by drinking small amounts, about  cup (120 mL) every 5-10 minutes. ? Slowly drink more until you have had the amount that your doctor said to have.  Drink enough clear fluid to keep your pee clear or pale yellow. If you were told to drink an ORS, finish the ORS  first, then start slowly drinking clear fluids. Drink fluids such as: ? Water. Do not drink only water by itself. Doing that can make the salt (sodium) level in your body get too low (hyponatremia). ? Ice chips. ? Fruit juice that you have added water to (diluted). ? Low-calorie sports drinks.  Avoid: ? Alcohol. ? Drinks that have a lot of sugar. These include high-calorie sports drinks, fruit juice that does not have water added, and soda. ? Caffeine. ? Foods that are greasy or have a lot of fat or sugar.  Take over-the-counter and prescription medicines only as told by your doctor.  Do not take salt tablets. Doing that can make the salt level in your body get too high (hypernatremia).  Eat foods that have minerals (electrolytes). Examples include bananas, oranges, potatoes, tomatoes, and spinach.  Keep all follow-up visits as told by your doctor. This is important. Contact a doctor if:  You have belly (abdominal) pain that: ? Gets worse. ? Stays in one area (localizes).  You have a rash.  You have a stiff neck.  You get angry or annoyed more easily than normal (irritability).  You are more sleepy than normal.  You have a harder time waking up than normal.  You feel: ? Weak. ? Dizzy. ? Very thirsty.  You have peed (urinated) only a small amount of very dark pee during 6-8 hours. Get help right away if:  You have symptoms of   very bad dehydration.  You cannot drink fluids without throwing up (vomiting).  Your symptoms get worse with treatment.  You have a fever.  You have a very bad headache.  You are throwing up or having watery poop (diarrhea) and it: ? Gets worse. ? Does not go away.  You have blood or something green (bile) in your throw-up.  You have blood in your poop (stool). This may cause poop to look black and tarry.  You have not peed in 6-8 hours.  You pass out (faint).  Your heart rate when you are sitting still is more than 100 beats a  minute.  You have trouble breathing. This information is not intended to replace advice given to you by your health care provider. Make sure you discuss any questions you have with your health care provider. Document Released: 02/23/2009 Document Revised: 11/17/2015 Document Reviewed: 06/23/2015 Elsevier Interactive Patient Education  2018 [employer]  

## 2018-02-28 ENCOUNTER — Other Ambulatory Visit: Payer: Self-pay | Admitting: Oncology

## 2018-03-05 ENCOUNTER — Inpatient Hospital Stay: Payer: Medicare Other

## 2018-03-05 ENCOUNTER — Inpatient Hospital Stay (HOSPITAL_BASED_OUTPATIENT_CLINIC_OR_DEPARTMENT_OTHER): Payer: Medicare Other | Admitting: Nurse Practitioner

## 2018-03-05 ENCOUNTER — Ambulatory Visit (HOSPITAL_COMMUNITY)
Admission: RE | Admit: 2018-03-05 | Discharge: 2018-03-05 | Disposition: A | Discharge status: Home / Self Care | Payer: Medicare Other | Source: Ambulatory Visit | Attending: Nurse Practitioner | Admitting: Nurse Practitioner

## 2018-03-05 ENCOUNTER — Telehealth: Payer: Self-pay | Admitting: Nurse Practitioner

## 2018-03-05 ENCOUNTER — Encounter: Payer: Self-pay | Admitting: Nurse Practitioner

## 2018-03-05 ENCOUNTER — Other Ambulatory Visit: Payer: Self-pay | Admitting: Medical

## 2018-03-05 VITALS — BP 102/63 | HR 77 | Temp 98.6°F | Resp 17 | Ht 66.0 in | Wt 131.7 lb

## 2018-03-05 DIAGNOSIS — J189 Pneumonia, unspecified organism: Secondary | ICD-10-CM

## 2018-03-05 DIAGNOSIS — R0600 Dyspnea, unspecified: Secondary | ICD-10-CM

## 2018-03-05 DIAGNOSIS — C8590 Non-Hodgkin lymphoma, unspecified, unspecified site: Secondary | ICD-10-CM

## 2018-03-05 DIAGNOSIS — D701 Agranulocytosis secondary to cancer chemotherapy: Secondary | ICD-10-CM | POA: Diagnosis not present

## 2018-03-05 DIAGNOSIS — C8330 Diffuse large B-cell lymphoma, unspecified site: Secondary | ICD-10-CM

## 2018-03-05 DIAGNOSIS — Z5189 Encounter for other specified aftercare: Secondary | ICD-10-CM | POA: Diagnosis not present

## 2018-03-05 DIAGNOSIS — R0609 Other forms of dyspnea: Secondary | ICD-10-CM | POA: Diagnosis not present

## 2018-03-05 DIAGNOSIS — R630 Anorexia: Secondary | ICD-10-CM

## 2018-03-05 DIAGNOSIS — Z5112 Encounter for antineoplastic immunotherapy: Secondary | ICD-10-CM | POA: Diagnosis not present

## 2018-03-05 DIAGNOSIS — J181 Lobar pneumonia, unspecified organism: Secondary | ICD-10-CM

## 2018-03-05 DIAGNOSIS — Z23 Encounter for immunization: Secondary | ICD-10-CM | POA: Diagnosis not present

## 2018-03-05 DIAGNOSIS — Z5111 Encounter for antineoplastic chemotherapy: Secondary | ICD-10-CM | POA: Diagnosis not present

## 2018-03-05 DIAGNOSIS — Z79899 Other long term (current) drug therapy: Secondary | ICD-10-CM | POA: Diagnosis not present

## 2018-03-05 DIAGNOSIS — R918 Other nonspecific abnormal finding of lung field: Secondary | ICD-10-CM | POA: Insufficient documentation

## 2018-03-05 LAB — CMP (CANCER CENTER ONLY)
ALBUMIN: 2.7 g/dL — AB (ref 3.5–5.0)
ALT: 16 U/L (ref 0–44)
AST: 25 U/L (ref 15–41)
Alkaline Phosphatase: 292 U/L — ABNORMAL HIGH (ref 38–126)
Anion gap: 12 (ref 5–15)
BUN: 19 mg/dL (ref 8–23)
CHLORIDE: 105 mmol/L (ref 98–111)
CO2: 20 mmol/L — AB (ref 22–32)
Calcium: 8.6 mg/dL — ABNORMAL LOW (ref 8.9–10.3)
Creatinine: 0.89 mg/dL (ref 0.61–1.24)
GFR, Est AFR Am: >60 mL/min (ref >60)
GFR, Estimated: >60 mL/min (ref >60)
GLUCOSE: 174 mg/dL — AB (ref 70–99)
POTASSIUM: 4.3 mmol/L (ref 3.5–5.1)
Sodium: 137 mmol/L (ref 135–145)
Total Bilirubin: 0.4 mg/dL (ref 0.3–1.2)
Total Protein: 6.2 g/dL — ABNORMAL LOW (ref 6.5–8.1)

## 2018-03-05 LAB — CBC WITH DIFFERENTIAL (CANCER CENTER ONLY)
Abs Immature Granulocytes: 0.08 10*3/uL — ABNORMAL HIGH (ref 0.00–0.07)
Basophils Absolute: 0.1 10*3/uL (ref 0.0–0.1)
Basophils Relative: 1 %
Eosinophils Absolute: 0.1 10*3/uL (ref 0.0–0.5)
Eosinophils Relative: 0 %
HCT: 31.6 % — ABNORMAL LOW (ref 39.0–52.0)
HEMOGLOBIN: 10.2 g/dL — AB (ref 13.0–17.0)
Immature Granulocytes: 1 %
LYMPHS PCT: 11 %
Lymphs Abs: 1.5 10*3/uL (ref 0.7–4.0)
MCH: 28.3 pg (ref 26.0–34.0)
MCHC: 32.3 g/dL (ref 30.0–36.0)
MCV: 87.5 fL (ref 80.0–100.0)
MONO ABS: 1.2 10*3/uL — AB (ref 0.1–1.0)
MONOS PCT: 9 %
NEUTROS ABS: 10.5 10*3/uL — AB (ref 1.7–7.7)
Neutrophils Relative %: 78 %
Platelet Count: 415 10*3/uL — ABNORMAL HIGH (ref 150–400)
RBC: 3.61 MIL/uL — AB (ref 4.22–5.81)
RDW: 18.4 % — ABNORMAL HIGH (ref 11.5–15.5)
WBC Count: 13.5 10*3/uL — ABNORMAL HIGH (ref 4.0–10.5)
nRBC: 0 % (ref 0.0–0.2)

## 2018-03-05 LAB — BRAIN NATRIURETIC PEPTIDE: B Natriuretic Peptide: 178 pg/mL — ABNORMAL HIGH (ref 0.0–100.0)

## 2018-03-05 MED ORDER — SODIUM CHLORIDE 0.9% FLUSH
10.0000 mL | Freq: Once | INTRAVENOUS | Status: AC | PRN
  Filled 2018-03-05: qty 10

## 2018-03-05 MED ORDER — SODIUM CHLORIDE 0.9 % IV SOLN: 1.0000 g | Freq: Once | INTRAVENOUS | Status: DC

## 2018-03-05 MED ORDER — SODIUM CHLORIDE 0.9% FLUSH
3.0000 mL | Freq: Once | INTRAVENOUS | Status: AC | PRN
  Administered 2018-03-05: 3 mL
  Filled 2018-03-05: qty 10

## 2018-03-05 MED ORDER — HEPARIN SOD (PORK) LOCK FLUSH 100 UNIT/ML IV SOLN
500.0000 [IU] | Freq: Once | INTRAVENOUS | Status: AC | PRN
  Administered 2018-03-05: 500 [IU]
  Filled 2018-03-05: qty 5

## 2018-03-05 MED ORDER — SODIUM CHLORIDE 0.9 % IV SOLN
2.0000 g | Freq: Once | INTRAVENOUS | Status: AC
  Administered 2018-03-05: 2 g via INTRAVENOUS
  Filled 2018-03-05: qty 2

## 2018-03-05 MED ORDER — IOPAMIDOL (ISOVUE-370) INJECTION 76%
INTRAVENOUS | Status: AC
  Administered 2018-03-05: 47 mL
  Filled 2018-03-05: qty 100

## 2018-03-05 MED ORDER — SODIUM CHLORIDE 0.9% FLUSH
10.0000 mL | Freq: Once | INTRAVENOUS | Status: AC | PRN
  Administered 2018-03-05: 10 mL
  Filled 2018-03-05: qty 10

## 2018-03-05 MED ORDER — SODIUM CHLORIDE 0.9 % IJ SOLN
INTRAMUSCULAR | Status: AC
  Filled 2018-03-05: qty 50

## 2018-03-05 MED ORDER — HEPARIN SOD (PORK) LOCK FLUSH 100 UNIT/ML IV SOLN
500.0000 [IU] | Freq: Once | INTRAVENOUS | Status: AC | PRN
  Filled 2018-03-05: qty 5

## 2018-03-05 MED ORDER — DOXYCYCLINE HYCLATE 100 MG PO TABS: 100.0000 mg | ORAL_TABLET | Freq: Two times a day (BID) | ORAL | 0 refills | Status: DC

## 2018-03-05 NOTE — Progress Notes (Addendum)
[facility] OFFICE PROGRESS NOTE   Diagnosis: Non-Hodgkin's lymphoma  INTERVAL HISTORY:   [name] returns as scheduled.  He completed cycle 3 CHOP/Rituxan 02/12/2018.  He was seen in an unscheduled visit on 02/27/2018 for evaluation of weakness.  He received IV fluids.  He felt a little better after the IV fluids but continues to feel weak.  He reports progressive dyspnea on exertion over the past 3 weeks.  No fever.  Some cough.  No chest pain.  No leg swelling or calf pain.  No nausea or vomiting.  No diarrhea.  Poor appetite.  He continues to have shoulder and back pain.  Objective:  Vital signs in last 24 hours:  Blood pressure 102/63, pulse 77, temperature 98.6 F (37 C), temperature source Oral, resp. rate 17, height 5\' 6"  (1.676 m), weight 131 lb 11.2 oz (59.7 kg), SpO2 100 %.    HEENT: No thrush or ulcers. Resp: Distant breath sounds. Cardio: Regular rate and rhythm.  No neck vein distention. GI: Abdomen soft and nontender.  No hepatomegaly. Vascular: No leg edema.  Left lower leg appears slightly larger than the right lower leg.  Calves soft and nontender. Neuro: Alert and oriented. Skin: Dry appearing. Port-A-Cath without erythema.   Lab Results:  Lab Results  Component Value Date   WBC 13.5 (H) 03/05/2018   HGB 10.2 (L) 03/05/2018   HCT 31.6 (L) 03/05/2018   MCV 87.5 03/05/2018   PLT 415 (H) 03/05/2018   NEUTROABS 10.5 (H) 03/05/2018    Imaging:  Ct Angio Chest Pe W Or Wo Contrast  Result Date: 03/05/2018 CLINICAL DATA:  Dyspnea on exertion. EXAM: CT ANGIOGRAPHY CHEST WITH CONTRAST TECHNIQUE: Multidetector CT imaging of the chest was performed using the standard protocol during bolus administration of intravenous contrast. Multiplanar CT image reconstructions and MIPs were obtained to evaluate the vascular anatomy. CONTRAST:  29mL ISOVUE-370 IOPAMIDOL (ISOVUE-370) INJECTION 76% COMPARISON:  CT scan of December 24, 2017. FINDINGS:  Cardiovascular: Satisfactory opacification of the pulmonary arteries to the segmental level. No evidence of pulmonary embolism. Normal heart size. No pericardial effusion. Status post coronary artery bypass graft. Mediastinum/Nodes: No enlarged mediastinal, hilar, or axillary lymph nodes. Thyroid gland, trachea, and esophagus demonstrate no significant findings. Lungs/Pleura: No pneumothorax or pleural effusion is noted. Right upper lobe airspace opacity is noted consistent with pneumonia. Cluster of nodules noted in the right lower lobe posteriorly most consistent with atypical inflammation. Left lung is unremarkable. Upper Abdomen: Visualized portion of retroperitoneal mass appears to be decreased in size compared to prior exam. Musculoskeletal: No chest wall abnormality. No acute or significant osseous findings. Review of the MIP images confirms the above findings. IMPRESSION: No definite evidence of pulmonary embolus. Right upper lobe airspace opacity is noted consistent with pneumonia. Cluster of nodules is seen posteriorly in right lower lobe most consistent with atypical inflammation. Visualized portion of retroperitoneal mass appears to be decreased in size compared to prior exam. Electronically Signed   By: Marijo Conception, M.D.   On: 03/05/2018 15:40    Medications: I have reviewed the patient's current medications.  Assessment/Plan: 1. Non-Hodgkin lymphoma - he completed fludarabine chemotherapy in February 2003. He remains in clinical remission.  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 Indeterminate lesion in the left liver, tubular structure in  the right lower quadrant concerning for mucocele, indeterminate left lower lobe  nodule  CT biopsy of retroperitoneal mass 12/25/2017-diffuse large B-cell lymphoma, CD20 positive  Cycle 1 CHOP/Rituxan 01/01/2018  PET scan 1/50/5697-XYIAX hypermetabolic retroperitoneal and mesenteric mass; mild thoracic involvement including a hypermetabolic nodule in the suprasternal notch and a small hypermetabolic left periaortic lymph node;musculoskeletal involvement including the left T2 vertebrae, potentially the right distal medial sternocleidomastoid muscle and the right humeral shaft; approximately 5 hypermetabolic liver lesions; accentuated activity near the orifice of the appendix; other questionable activity along the gastrointestinal tract including scattered esophageal activity and in the proximal stomach without definite correlating CT abnormalities; left hydronephrosis and hydroureter extending down to the somewhat distended urinary bladder which demonstrates wall thickening and trabeculation.  Cycle 2 CHOP/Rituxan 01/22/2018 (Adriamycin and Cytoxan dose reduced due to severe neutropenia following cycle 1)  Cycle 3 CHOP/rituximab 02/12/2018  2. History of coronary artery disease - status post coronary artery bypass surgery. 3. Pneumococcal vaccine in April 2014 4. Left knee pain and swelling-? Left knee arthritis 5.Hypercalcemia 12/19/2017, status post Zometa 12/19/2017-improved 6.Elevated creatininesecondary to obstructing left peritoneal mass  CT 12/24/2017-left hydronephrosis secondary to obstructing retroperitoneal mass 7.Anorexia/weight loss/failure to thrive 8.Echocardiogram 12/31/2017-LVEF 50-55%, mild diffuse hypokinesis 9.Neutropenia secondary to chemotherapy. He will begin ciprofloxacin. 10.Nausea/vomiting, question delayed nausea related to chemotherapy. IV fluids, Zofran/dexamethasone; Zofran as needed at home. 11.Constipation, question secondary to chemotherapy. Begin MiraLAX. 12.Oral candidiasis, question early mucositis. He completeda  course of Diflucan.Resolved.   Disposition: [name] has completed 3 cycles of CHOP/Rituxan.  He presents today prior to proceeding with cycle 4.  He has developed progressive dyspnea over the past 3 weeks.  He becomes obviously short of breath with ambulation.  We will obtain a BNP.  He is being referred for a stat CT scan of the chest to rule out PE.  He will return to the [facility] after the CT scan.  I reviewed his case with Sandi Mealy, PA.  He will see [name] in the office after the scan has been completed.   Patient seen with Dr. Benay Spice.  Addendum: [name] was seen after having a CT angiogram completed.  The results showed:  1) No definite evidence of pulmonary embolus.  2) Right upper lobe airspace opacity is noted consistent with pneumonia. Cluster of nodules is seen posteriorly in right lower lobe most consistent with atypical inflammation.  3) Visualized portion of retroperitoneal mass appears to be decreased in size compared to prior exam.  [name] was given the option of being managed as an outpatient versus being directly admitted.  He elected to be treated as an outpatient.  He was given 2 g of cefepime IV x1 and was prescribed doxycycline 100 mg p.o. twice daily x7 days.  He was instructed to present to the emergency room, return to clinic or contact our office should his symptoms worsen.  [name] and his wife expressed understanding and agreement with this plan.    Lyndon Code David Little ANP/GNP-BC   03/05/2018  4:59 PM  This was a shared visit with Ned Card and Sandi Mealy. Mr. Haywood was interviewed and examined.  His symptoms appear to be related to pneumonia.  He will complete a course of antibiotics.  Chemotherapy will be placed on hold.He will return for an office visit 10/28  Julieanne Manson, MD

## 2018-03-05 NOTE — Telephone Encounter (Signed)
Scheduled appt per 10/24 los - left message for patient with appt date and time.

## 2018-03-06 ENCOUNTER — Inpatient Hospital Stay: Payer: Medicare Other

## 2018-03-09 ENCOUNTER — Inpatient Hospital Stay (HOSPITAL_BASED_OUTPATIENT_CLINIC_OR_DEPARTMENT_OTHER): Payer: Medicare Other | Admitting: Oncology

## 2018-03-09 ENCOUNTER — Telehealth: Payer: Self-pay

## 2018-03-09 VITALS — BP 103/64 | HR 83 | Temp 97.5°F | Resp 18 | Wt 128.8 lb

## 2018-03-09 DIAGNOSIS — Z87891 Personal history of nicotine dependence: Secondary | ICD-10-CM

## 2018-03-09 DIAGNOSIS — E785 Hyperlipidemia, unspecified: Secondary | ICD-10-CM | POA: Diagnosis present

## 2018-03-09 DIAGNOSIS — I2510 Atherosclerotic heart disease of native coronary artery without angina pectoris: Secondary | ICD-10-CM | POA: Diagnosis present

## 2018-03-09 DIAGNOSIS — I5032 Chronic diastolic (congestive) heart failure: Secondary | ICD-10-CM | POA: Diagnosis present

## 2018-03-09 DIAGNOSIS — D701 Agranulocytosis secondary to cancer chemotherapy: Secondary | ICD-10-CM | POA: Diagnosis present

## 2018-03-09 DIAGNOSIS — Z9181 History of falling: Secondary | ICD-10-CM

## 2018-03-09 DIAGNOSIS — R630 Anorexia: Secondary | ICD-10-CM

## 2018-03-09 DIAGNOSIS — C8330 Diffuse large B-cell lymphoma, unspecified site: Secondary | ICD-10-CM | POA: Diagnosis not present

## 2018-03-09 DIAGNOSIS — Z23 Encounter for immunization: Secondary | ICD-10-CM

## 2018-03-09 DIAGNOSIS — E43 Unspecified severe protein-calorie malnutrition: Secondary | ICD-10-CM | POA: Diagnosis present

## 2018-03-09 DIAGNOSIS — Z951 Presence of aortocoronary bypass graft: Secondary | ICD-10-CM

## 2018-03-09 DIAGNOSIS — Z7982 Long term (current) use of aspirin: Secondary | ICD-10-CM

## 2018-03-09 DIAGNOSIS — B370 Candidal stomatitis: Secondary | ICD-10-CM | POA: Diagnosis present

## 2018-03-09 DIAGNOSIS — Z6820 Body mass index (BMI) 20.0-20.9, adult: Secondary | ICD-10-CM

## 2018-03-09 DIAGNOSIS — R05 Cough: Secondary | ICD-10-CM | POA: Diagnosis not present

## 2018-03-09 DIAGNOSIS — K5900 Constipation, unspecified: Secondary | ICD-10-CM | POA: Diagnosis present

## 2018-03-09 DIAGNOSIS — Z96652 Presence of left artificial knee joint: Secondary | ICD-10-CM | POA: Diagnosis present

## 2018-03-09 DIAGNOSIS — R627 Adult failure to thrive: Secondary | ICD-10-CM | POA: Diagnosis present

## 2018-03-09 DIAGNOSIS — Z85828 Personal history of other malignant neoplasm of skin: Secondary | ICD-10-CM

## 2018-03-09 DIAGNOSIS — Z66 Do not resuscitate: Secondary | ICD-10-CM | POA: Diagnosis not present

## 2018-03-09 DIAGNOSIS — R0609 Other forms of dyspnea: Secondary | ICD-10-CM

## 2018-03-09 DIAGNOSIS — C8590 Non-Hodgkin lymphoma, unspecified, unspecified site: Secondary | ICD-10-CM

## 2018-03-09 DIAGNOSIS — I214 Non-ST elevation (NSTEMI) myocardial infarction: Principal | ICD-10-CM | POA: Diagnosis present

## 2018-03-09 DIAGNOSIS — I252 Old myocardial infarction: Secondary | ICD-10-CM

## 2018-03-09 DIAGNOSIS — J9601 Acute respiratory failure with hypoxia: Secondary | ICD-10-CM | POA: Diagnosis present

## 2018-03-09 DIAGNOSIS — I110 Hypertensive heart disease with heart failure: Secondary | ICD-10-CM | POA: Diagnosis present

## 2018-03-09 DIAGNOSIS — N400 Enlarged prostate without lower urinary tract symptoms: Secondary | ICD-10-CM | POA: Diagnosis present

## 2018-03-09 DIAGNOSIS — Z79899 Other long term (current) drug therapy: Secondary | ICD-10-CM

## 2018-03-09 DIAGNOSIS — R410 Disorientation, unspecified: Secondary | ICD-10-CM | POA: Diagnosis not present

## 2018-03-09 DIAGNOSIS — J181 Lobar pneumonia, unspecified organism: Secondary | ICD-10-CM | POA: Diagnosis present

## 2018-03-09 MED ORDER — LEVOFLOXACIN 750 MG PO TABS: 750.0000 mg | ORAL_TABLET | Freq: Every day | ORAL | 0 refills | Status: DC

## 2018-03-09 NOTE — Progress Notes (Signed)
[facility] OFFICE PROGRESS NOTE   Diagnosis: Non-Hodgkin's lymphoma  INTERVAL HISTORY:   Mr. Delsanto returns for a scheduled visit.  We saw him on 03/05/2018 for evaluation of dyspnea and a cough.  A CT of the chest was negative for pulmonary embolism, but revealed right lung pneumonia.  He received a dose of cefepime and was prescribed doxycycline.  He reports no significant improvement in exertional dyspnea.  He he continues to have a cough.  No fever.  He has a poor appetite.  Objective:  Vital signs in last 24 hours:  Blood pressure 103/64, pulse 83, temperature (!) 97.5 F (36.4 C), temperature source Oral, resp. rate 18, weight 128 lb 12.8 oz (58.4 kg), SpO2 100 %.    Resp: Lungs clear bilaterally, no respiratory distress at rest Cardio: Regular rhythm, no gallop, the neck veins are not distended GI: Soft and nontender, no mass, no hepatosplenomegaly Vascular: No leg edema  Portacath/PICC-without erythema  Lab Results:  Lab Results  Component Value Date   WBC 13.5 (H) 03/05/2018   HGB 10.2 (L) 03/05/2018   HCT 31.6 (L) 03/05/2018   MCV 87.5 03/05/2018   PLT 415 (H) 03/05/2018   NEUTROABS 10.5 (H) 03/05/2018    CMP  Lab Results  Component Value Date   NA 137 03/05/2018   K 4.3 03/05/2018   CL 105 03/05/2018   CO2 20 (L) 03/05/2018   GLUCOSE 174 (H) 03/05/2018   BUN 19 03/05/2018   CREATININE 0.89 03/05/2018   CALCIUM 8.6 (L) 03/05/2018   PROT 6.2 (L) 03/05/2018   ALBUMIN 2.7 (L) 03/05/2018   AST 25 03/05/2018   ALT 16 03/05/2018   ALKPHOS 292 (H) 03/05/2018   BILITOT 0.4 03/05/2018   GFRNONAA >60 03/05/2018   GFRAA >60 03/05/2018     Medications: I have reviewed the patient's current medications.   Assessment/Plan: 1. Non-Hodgkin lymphoma - he completed fludarabine chemotherapy in February 2003. He remains in clinical remission.  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 Indeterminate lesion in the left liver, tubular structure in the right lower quadrant concerning for mucocele, indeterminate left lower lobe nodule  CT biopsy of retroperitoneal mass 12/25/2017-diffuse large B-cell lymphoma, CD20 positive  Cycle 1 CHOP/Rituxan 01/01/2018  PET scan 9/32/3557-DUKGU hypermetabolic retroperitoneal and mesenteric mass; mild thoracic involvement including a hypermetabolic nodule in the suprasternal notch and a small hypermetabolic left periaortic lymph node;musculoskeletal involvement including the left T2 vertebrae, potentially the right distal medial sternocleidomastoid muscle and the right humeral shaft; approximately 5 hypermetabolic liver lesions; accentuated activity near the orifice of the appendix; other questionable activity along the gastrointestinal tract including scattered esophageal activity and in the proximal stomach without definite correlating CT abnormalities; left hydronephrosis and hydroureter extending down to the somewhat distended urinary bladder which demonstrates wall thickening and trabeculation.  Cycle 2 CHOP/Rituxan 01/22/2018 (Adriamycin and Cytoxan dose reduced due to severe neutropenia following cycle 1)  Cycle 3 CHOP/rituximab 02/12/2018  Decreased retroperitoneal mass on CT chest 03/05/2018  2. History of coronary artery disease - status post coronary artery bypass surgery. 3. Pneumococcal vaccine in April 2014 4. Left knee pain and swelling-? Left knee arthritis 5.Hypercalcemia 12/19/2017, status post Zometa 12/19/2017-improved 6.Elevated creatininesecondary to obstructing left peritoneal mass  CT 12/24/2017-left hydronephrosis secondary to obstructing retroperitoneal mass 7.Anorexia/weight loss/failure to  thrive 8.Echocardiogram 12/31/2017-LVEF 50-55%, mild diffuse hypokinesis 9.Neutropenia secondary to chemotherapy. He will begin ciprofloxacin. 10.Nausea/vomiting, question delayed nausea related to chemotherapy. IV fluids, Zofran/dexamethasone; Zofran as needed at home. 11.Constipation, question secondary to chemotherapy. Begin MiraLAX. 12.Oral candidiasis, question early mucositis. He completeda course of Diflucan.Resolved. 13.  Exertional dyspnea, cough, failure to thrive 03/05/2018-CT chest revealed right upper lobe pneumonia, no evidence of pulmonary embolus but decreased retroperitoneal mass    Disposition: Mr. Ricci was diagnosed with pneumonia when he was here last week.  He was prescribed doxycycline, but there is been no improvement in his dyspnea and anorexia.  He will begin a course of Levaquin. The BNP was mildly elevated on 03/05/2018.  We will refer him for an echocardiogram. Cycle 4 CHOP-rituximab will remain on hold. Mr. Sites will return for an office visit on 03/11/2018.  We will obtain a chest x-ray if he has not improved 03/11/2018.  His symptoms are most likely related to pneumonia.  I will make a pulmonary referral if he is not better on 03/11/2018.    Betsy Coder, MD  03/09/2018  3:41 PM

## 2018-03-09 NOTE — Telephone Encounter (Signed)
Printed avs and calender of upcoming appointment. Per 10/28 los 

## 2018-03-11 ENCOUNTER — Inpatient Hospital Stay (HOSPITAL_BASED_OUTPATIENT_CLINIC_OR_DEPARTMENT_OTHER): Payer: Medicare Other | Admitting: Nurse Practitioner

## 2018-03-11 ENCOUNTER — Ambulatory Visit (HOSPITAL_COMMUNITY): Admission: RE | Admit: 2018-03-11 | Payer: Self-pay | Source: Ambulatory Visit

## 2018-03-11 ENCOUNTER — Encounter (HOSPITAL_COMMUNITY): Payer: Self-pay

## 2018-03-11 ENCOUNTER — Encounter: Payer: Self-pay | Admitting: Nurse Practitioner

## 2018-03-11 ENCOUNTER — Observation Stay (HOSPITAL_COMMUNITY): Payer: Medicare Other

## 2018-03-11 ENCOUNTER — Telehealth: Payer: Self-pay | Admitting: Hematology

## 2018-03-11 ENCOUNTER — Inpatient Hospital Stay (HOSPITAL_COMMUNITY)
Admission: AD | Admit: 2018-03-11 | Discharge: 2018-03-19 | DRG: 280 | Disposition: A | Discharge status: Skilled Nursing Facility | Payer: Medicare Other | Source: Ambulatory Visit | Attending: Family Medicine | Admitting: Family Medicine

## 2018-03-11 ENCOUNTER — Other Ambulatory Visit: Payer: Self-pay

## 2018-03-11 VITALS — BP 108/59 | HR 83 | Temp 97.6°F | Resp 18 | Ht 66.0 in | Wt 126.8 lb

## 2018-03-11 DIAGNOSIS — I10 Essential (primary) hypertension: Secondary | ICD-10-CM | POA: Diagnosis not present

## 2018-03-11 DIAGNOSIS — R630 Anorexia: Secondary | ICD-10-CM | POA: Diagnosis not present

## 2018-03-11 DIAGNOSIS — J181 Lobar pneumonia, unspecified organism: Secondary | ICD-10-CM

## 2018-03-11 DIAGNOSIS — C8330 Diffuse large B-cell lymphoma, unspecified site: Secondary | ICD-10-CM

## 2018-03-11 DIAGNOSIS — I2119 ST elevation (STEMI) myocardial infarction involving other coronary artery of inferior wall: Secondary | ICD-10-CM

## 2018-03-11 DIAGNOSIS — R0609 Other forms of dyspnea: Secondary | ICD-10-CM | POA: Diagnosis not present

## 2018-03-11 DIAGNOSIS — R0600 Dyspnea, unspecified: Secondary | ICD-10-CM

## 2018-03-11 DIAGNOSIS — J189 Pneumonia, unspecified organism: Secondary | ICD-10-CM

## 2018-03-11 DIAGNOSIS — E43 Unspecified severe protein-calorie malnutrition: Secondary | ICD-10-CM

## 2018-03-11 DIAGNOSIS — C8590 Non-Hodgkin lymphoma, unspecified, unspecified site: Secondary | ICD-10-CM

## 2018-03-11 DIAGNOSIS — Z9181 History of falling: Secondary | ICD-10-CM

## 2018-03-11 DIAGNOSIS — R05 Cough: Secondary | ICD-10-CM | POA: Diagnosis not present

## 2018-03-11 DIAGNOSIS — R0602 Shortness of breath: Secondary | ICD-10-CM | POA: Diagnosis not present

## 2018-03-11 LAB — URINALYSIS, ROUTINE W REFLEX MICROSCOPIC
BILIRUBIN URINE: NEGATIVE
Bacteria, UA: NONE SEEN
Glucose, UA: NEGATIVE mg/dL
Ketones, ur: NEGATIVE mg/dL
Nitrite: NEGATIVE
PROTEIN: 100 mg/dL — AB
RBC / HPF: >50 RBC/hpf — ABNORMAL HIGH (ref 0–5)
Specific Gravity, Urine: 1.015 (ref 1.005–1.030)
WBC, UA: >50 WBC/hpf — ABNORMAL HIGH (ref 0–5)
pH: 6 (ref 5.0–8.0)

## 2018-03-11 LAB — CBC
HCT: 32.8 % — ABNORMAL LOW (ref 39.0–52.0)
Hemoglobin: 10.3 g/dL — ABNORMAL LOW (ref 13.0–17.0)
MCH: 27.6 pg (ref 26.0–34.0)
MCHC: 31.4 g/dL (ref 30.0–36.0)
MCV: 87.9 fL (ref 80.0–100.0)
Platelets: 405 10*3/uL — ABNORMAL HIGH (ref 150–400)
RBC: 3.73 MIL/uL — ABNORMAL LOW (ref 4.22–5.81)
RDW: 18.9 % — ABNORMAL HIGH (ref 11.5–15.5)
WBC: 16.6 10*3/uL — AB (ref 4.0–10.5)
nRBC: 0 % (ref 0.0–0.2)

## 2018-03-11 LAB — COMPREHENSIVE METABOLIC PANEL
ALBUMIN: 3 g/dL — AB (ref 3.5–5.0)
ALK PHOS: 339 U/L — AB (ref 38–126)
ALT: 21 U/L (ref 0–44)
ANION GAP: 10 (ref 5–15)
AST: 37 U/L (ref 15–41)
BILIRUBIN TOTAL: 0.5 mg/dL (ref 0.3–1.2)
BUN: 29 mg/dL — ABNORMAL HIGH (ref 8–23)
CALCIUM: 8.6 mg/dL — AB (ref 8.9–10.3)
CO2: 21 mmol/L — AB (ref 22–32)
Chloride: 106 mmol/L (ref 98–111)
Creatinine, Ser: 1.11 mg/dL (ref 0.61–1.24)
GFR calc non Af Amer: >60 mL/min (ref >60)
GLUCOSE: 116 mg/dL — AB (ref 70–99)
Potassium: 4.2 mmol/L (ref 3.5–5.1)
SODIUM: 137 mmol/L (ref 135–145)
TOTAL PROTEIN: 6.3 g/dL — AB (ref 6.5–8.1)

## 2018-03-11 LAB — BRAIN NATRIURETIC PEPTIDE: B Natriuretic Peptide: 147.5 pg/mL — ABNORMAL HIGH (ref 0.0–100.0)

## 2018-03-11 MED ORDER — ENOXAPARIN SODIUM 40 MG/0.4ML ~~LOC~~ SOLN
40.0000 mg | SUBCUTANEOUS | Status: DC
  Administered 2018-03-11 – 2018-03-12 (×2): 40 mg via SUBCUTANEOUS
  Filled 2018-03-11 (×2): qty 0.4

## 2018-03-11 MED ORDER — LEVOFLOXACIN 750 MG PO TABS
750.0000 mg | ORAL_TABLET | Freq: Every day | ORAL | Status: DC
  Administered 2018-03-11 – 2018-03-12 (×2): 750 mg via ORAL
  Filled 2018-03-11 (×2): qty 1

## 2018-03-11 MED ORDER — TRAMADOL HCL 50 MG PO TABS
50.0000 mg | ORAL_TABLET | Freq: Four times a day (QID) | ORAL | Status: DC | PRN
  Administered 2018-03-12 – 2018-03-18 (×15): 50 mg via ORAL
  Filled 2018-03-11 (×16): qty 1

## 2018-03-11 MED ORDER — METOPROLOL TARTRATE 25 MG PO TABS
25.0000 mg | ORAL_TABLET | Freq: Two times a day (BID) | ORAL | Status: DC
  Administered 2018-03-11 – 2018-03-14 (×6): 25 mg via ORAL
  Filled 2018-03-11 (×7): qty 1

## 2018-03-11 MED ORDER — SODIUM CHLORIDE 0.9% FLUSH
10.0000 mL | Freq: Once | INTRAVENOUS | Status: AC | PRN
  Administered 2018-03-11: 10 mL
  Filled 2018-03-11: qty 10

## 2018-03-11 MED ORDER — TAMSULOSIN HCL 0.4 MG PO CAPS
0.4000 mg | ORAL_CAPSULE | Freq: Every day | ORAL | Status: DC
  Administered 2018-03-11 – 2018-03-18 (×8): 0.4 mg via ORAL
  Filled 2018-03-11 (×8): qty 1

## 2018-03-11 MED ORDER — ONDANSETRON 4 MG PO TBDP
8.0000 mg | ORAL_TABLET | Freq: Three times a day (TID) | ORAL | Status: DC | PRN
  Administered 2018-03-12 – 2018-03-15 (×3): 8 mg via ORAL
  Filled 2018-03-11 (×3): qty 2

## 2018-03-11 MED ORDER — SODIUM CHLORIDE 0.9% FLUSH
10.0000 mL | INTRAVENOUS | Status: DC | PRN
  Administered 2018-03-12 – 2018-03-14 (×2): 10 mL
  Filled 2018-03-11 (×2): qty 40

## 2018-03-11 MED ORDER — ASPIRIN EC 81 MG PO TBEC
81.0000 mg | DELAYED_RELEASE_TABLET | Freq: Every day | ORAL | Status: DC
  Administered 2018-03-11 – 2018-03-19 (×9): 81 mg via ORAL
  Filled 2018-03-11 (×9): qty 1

## 2018-03-11 MED ORDER — SODIUM CHLORIDE 0.9% FLUSH
10.0000 mL | Freq: Two times a day (BID) | INTRAVENOUS | Status: DC
  Administered 2018-03-13 – 2018-03-14 (×2): 10 mL

## 2018-03-11 NOTE — Plan of Care (Signed)
David Little is a 82 year old male with a history of non-Hodgkin's lymphoma which is recurred in addition to history of myocardial infarction status post CABG in 2007 with last nuclear stress test significant for low risk study and last transesophageal echocardiogram significant for normal EF with grade 1 diastolic dysfunction.  Request for direct admission secondary to persistent dyspnea on exertion.  Patient had a CT angiogram of the chest concerning for PE which was negative for PE but positive for right upper quadrant pneumonia.  Patient was started on doxycycline initially for symptoms and Levaquin added secondary to inadequate response to treatment.  Since starting Levaquin, patient's symptoms continue to be persistent and request is made for admission.  Office vitals include a blood pressure of 108/59, pulse of 83, normal respirations, normal temp, 100% SPO2 on room air.  Patient accepted to medical floor.  Recommend chest x-ray, PT and repeat echocardiogram when arrived to the hospital.  Patient still not arrived in the hospital secondary to lack of bed availability.  Discussed with outpatient physician and nursing staff that if patient continues to be worrisome, recommendation would be to send him to the emergency department for evaluation and subsequent admission if unable to be stabilized.  Cordelia Poche, MD [facility] 03/11/2018, 12:44 PM

## 2018-03-11 NOTE — Progress Notes (Signed)
Pt arrived to room via direct admit from cancer center. Initial assessment complete, no c/o pain, VSS. MD paged for orders. Pt resting comfortably in bed.  Kizzie Ide, RN

## 2018-03-11 NOTE — Progress Notes (Addendum)
[place]  [phone] [phone]  Clinic Follow up Note   Patient Care Team: Chipper Herb, MD as PCP - General (Family Medicine) 03/11/2018  DIAGNOSIS: Non-Hodgkin's Lymphoma   INTERVAL HISTORY: David Little returns for follow up as scheduled. He was last seen by Dr. Benay Spice on 10/28 for management of dyspnea and pneumonia. He was continued on doxycycline which he will complete today and levaquin was added. His cough has slightly increased, remains dry. No fever, chills, or chest pain. He reports progressive weakness over last 2 days. Has had little po intake. Dyspnea on exertion is worse, now conversationally dyspneic. His activity at home is very low, entails walking from bed/chair to bathroom; he requires multiple rest breaks while walking to bathroom. Denies dizziness. His wife reports he fell off the bed last night, bumped his left forehead, did not lose consciousness. He remained alert.    MEDICAL HISTORY:  Past Medical History:  Diagnosis Date  . Coronary artery disease 2007   s/p CABG in 2007 for severe left main disease  . Hypercholesterolemia   . Hyperlipidemia   . Lymphoma ([place]) 2003   without recurrence  . Myocardial infarction ([place]) 2007  . Normal nuclear stress test March 2012   EF 55%. No ischemia. Mild inferior scar  . Skin cancer of face     SURGICAL HISTORY: Past Surgical History:  Procedure Laterality Date  . CORONARY ARTERY BYPASS GRAFT  2007   Had coronary artery bypass grafting for severe left main coronary artery stenosis--Had follow up stress cardiolite study in February 2010 which showed EF of 55% with mild inferior hypokinesia--There was no ischemia  . HERNIA REPAIR    . INGUINAL HERNIA REPAIR Left 05/31/2016   Procedure: LEFT INGUINAL HERNIA REPAIR WITH MESH;  Surgeon: Jackolyn Confer, MD;  Location: [facility];  Service: General;  Laterality: Left;  . INSERTION OF MESH Left 05/31/2016   Procedure: INSERTION OF  MESH;  Surgeon: Jackolyn Confer, MD;  Location: [facility];  Service: General;  Laterality: Left;  . IR IMAGING GUIDED PORT INSERTION  01/05/2018  . TOTAL KNEE ARTHROPLASTY Left 09/29/2013   Procedure: TOTAL KNEE ARTHROPLASTY;  Surgeon: Alta Corning, MD;  Location: [facility];  Service: Orthopedics;  Laterality: Left;    I have reviewed the social history and family history with the patient and they are unchanged from previous note.  ALLERGIES:  has No Known Allergies.  MEDICATIONS:  No current facility-administered medications for this visit.    No current outpatient medications on file.   Facility-Administered Medications Ordered in Other Visits  Medication Dose Route Frequency Provider Last Rate Last Dose  . heparin lock flush 100 unit/mL  500 Units Intracatheter Once PRN Ladell Pier, MD      . sodium chloride flush (NS) 0.9 % injection 10 mL  10 mL Intracatheter Once PRN Ladell Pier, MD        PHYSICAL EXAMINATION: ECOG PERFORMANCE STATUS: 3 - Symptomatic, >50% confined to bed  Vitals:   03/11/18 0907  BP: (!) 108/59  Pulse: 83  Resp: 18  Temp: 97.6 F (36.4 C)  SpO2: 100%   Filed Weights   03/11/18 0907  Weight: 126 lb 12.8 oz (57.5 kg)    GENERAL: frail adult mail in wheelchair, no acute distress  SKIN:  no rashes or significant lesions EYES: sclera clear OROPHARYNX:no thrush or ulcers  NECK: no JVD LYMPH:  no palpable cervical lymphadenopathy  LUNGS: clear on left, mildly decreased  on right with normal breathing effort at rest HEART: regular rate & rhythm; no lower extremity edema ABDOMEN:abdomen soft, non-tender and normal bowel sounds NEURO: alert & oriented x 3 with fluent speech PAC without erythema   LABORATORY DATA:  I have reviewed the data as listed CBC Latest Ref Rng & Units 03/05/2018 02/27/2018 02/12/2018  WBC 4.0 - 10.5 K/uL 13.5(H) 10.9(H) 11.2(H)  Hemoglobin 13.0 - 17.0 g/dL 10.2(L) 9.8(L) 10.5(L)  Hematocrit 39.0 - 52.0 % 31.6(L) 30.6(L)  31.4(L)  Platelets 150 - 400 K/uL 415(H) 246 316     CMP Latest Ref Rng & Units 03/05/2018 02/27/2018 02/12/2018  Glucose 70 - 99 mg/dL 174(H) 117(H) 124(H)  BUN 8 - 23 mg/dL 19 17 12   Creatinine 0.61 - 1.24 mg/dL 0.89 0.81 0.78  Sodium 135 - 145 mmol/L 137 138 141  Potassium 3.5 - 5.1 mmol/L 4.3 4.4 3.8  Chloride 98 - 111 mmol/L 105 104 111  CO2 22 - 32 mmol/L 20(L) 25 23  Calcium 8.9 - 10.3 mg/dL 8.6(L) 8.8(L) 8.8(L)  Total Protein 6.5 - 8.1 g/dL 6.2(L) 6.2(L) 6.5  Total Bilirubin 0.3 - 1.2 mg/dL 0.4 0.4 0.3  Alkaline Phos 38 - 126 U/L 292(H) 253(H) 182(H)  AST 15 - 41 U/L 25 27 23   ALT 0 - 44 U/L 16 20 18       RADIOGRAPHIC STUDIES: I have personally reviewed the radiological images as listed and agreed with the findings in the report. No results found.   ASSESSMENT & PLAN:  1. Non-Hodgkin lymphoma - he completed fludarabine chemotherapy in February 2003. He remains in clinical remission.  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 Indeterminate lesion in the left liver, tubular structure in the right lower quadrant concerning for mucocele, indeterminate left lower lobe nodule  CT biopsy of retroperitoneal mass 12/25/2017-diffuse large B-cell lymphoma, CD20 positive  Cycle 1 CHOP/Rituxan 01/01/2018  PET scan 8/67/6195-KDTOI hypermetabolic retroperitoneal and mesenteric mass; mild thoracic involvement including a hypermetabolic nodule in the suprasternal notch and a small hypermetabolic left periaortic lymph node;musculoskeletal involvement including the left T2 vertebrae, potentially the right distal medial sternocleidomastoid muscle and the right humeral shaft; approximately 5 hypermetabolic liver lesions;  accentuated activity near the orifice of the appendix; other questionable activity along the gastrointestinal tract including scattered esophageal activity and in the proximal stomach without definite correlating CT abnormalities; left hydronephrosis and hydroureter extending down to the somewhat distended urinary bladder which demonstrates wall thickening and trabeculation.  Cycle 2 CHOP/Rituxan 01/22/2018 (Adriamycin and Cytoxan dose reduced due to severe neutropenia following cycle 1)  Cycle 3 CHOP/rituximab 02/12/2018  Decreased retroperitoneal mass on CT chest 03/05/2018  2. History of coronary artery disease - status post coronary artery bypass surgery. 3. Pneumococcal vaccine in April 2014 4. Left knee pain and swelling-? Left knee arthritis 5.Hypercalcemia 12/19/2017, status post Zometa 12/19/2017-improved 6.Elevated creatininesecondary to obstructing left peritoneal mass  CT 12/24/2017-left hydronephrosis secondary to obstructing retroperitoneal mass 7.Anorexia/weight loss/failure to thrive 8.Echocardiogram 12/31/2017-LVEF 50-55%, mild diffuse hypokinesis 9.Neutropenia secondary to chemotherapy. He will begin ciprofloxacin. 10.Nausea/vomiting, question delayed nausea related to chemotherapy. IV fluids, Zofran/dexamethasone; Zofran as needed at home. 11.Constipation, question secondary to chemotherapy. Begin MiraLAX. 12.Oral candidiasis, question early mucositis. He completeda course of Diflucan.Resolved. 13.  Exertional dyspnea, cough, failure to thrive 03/05/2018-CT chest revealed right upper lobe pneumonia,  no evidence of pulmonary embolus but decreased retroperitoneal mass  Disposition:  David Little appears weaker with worsening dyspnea. He cannot safely ambulate in clinic for O2% check. He has not improved clinically on multiple antibiotics for pneumonia including levaquin and doxycycline. He is not able to maintain adequate nutrition. The patient was  seen with Dr. Benay Spice who recommends direct admission to the hospital for inpatient evaluation and management per hospitalist. His echo is pending, to be done while inpatient. The patient agrees.   All questions were answered. The patient knows to call the clinic with any problems, questions or concerns. No barriers to learning was detected.     Alla Feeling, NP 03/11/18  This was a shared visit with Cira Rue.  David Little was interviewed and examined.  He is clinical status has not improved despite antibiotic therapy for the past 6 days.  He has persistent severe exertional dyspnea.  This may be related to persistent pneumonia or another etiology.  I contacted Dr. Lonny Prude.  He agrees to admit David Little for further evaluation.  I will follow him in the hospital.  Julieanne Manson, MD

## 2018-03-11 NOTE — H&P (Addendum)
History and Physical    MAALIK PINN HWT:[id number] DOB: Nov 13, 1935 DOA: (Not on file)  PCP: Chipper Herb, MD  Patient coming from: Home  Chief Complaint: Dyspnea on exertion  HPI: David Little is a 82 y.o. male with medical history significant of non-hodgkin lymphoma, hyperlipidemia, CAD s/p CABG. Dyspnea on exertion started about 6 weeks ago and has been progressively worsening. He now can only walk about 10 feet before feeling significantly dyspneic. He uses a walker to help with ambulation. He sometimes have some rib pain when he lies on his left side. No orthopnea or PND. No associated chest pain with his symptoms.  Outpatient office course: Vitals: BP of 108/59, Pulse of 83, Normal respirations, normal temp, 100% SpO2 on room air at rest Labs: None Imaging: None Medications/Course: None  Review of Systems: Review of Systems  Constitutional: Negative for chills and fever.  Respiratory: Positive for cough and shortness of breath. Negative for hemoptysis and sputum production.   Cardiovascular: Negative for chest pain, palpitations, orthopnea, leg swelling and PND.  Gastrointestinal: Positive for nausea and vomiting.  Genitourinary: Positive for dysuria, frequency and urgency. Negative for hematuria.       Hesitancy  Neurological: Positive for weakness. Negative for dizziness and headaches.  All other systems reviewed and are negative.   Past Medical History:  Diagnosis Date  . Coronary artery disease 2007   s/p CABG in 2007 for severe left main disease  . Hypercholesterolemia   . Hyperlipidemia   . Lymphoma ([place]) 2003   without recurrence  . Myocardial infarction ([place]) 2007  . Normal nuclear stress test March 2012   EF 55%. No ischemia. Mild inferior scar  . Skin cancer of face     Past Surgical History:  Procedure Laterality Date  . CORONARY ARTERY BYPASS GRAFT  2007   Had coronary artery bypass grafting for severe left main coronary artery  stenosis--Had follow up stress cardiolite study in February 2010 which showed EF of 55% with mild inferior hypokinesia--There was no ischemia  . HERNIA REPAIR    . INGUINAL HERNIA REPAIR Left 05/31/2016   Procedure: LEFT INGUINAL HERNIA REPAIR WITH MESH;  Surgeon: Jackolyn Confer, MD;  [facility];  Service: General;  Laterality: Left;  . INSERTION OF MESH Left 05/31/2016   Procedure: INSERTION OF MESH;  Surgeon: Jackolyn Confer, MD;  [facility];  Service: General;  Laterality: Left;  . IR IMAGING GUIDED PORT INSERTION  01/05/2018  . TOTAL KNEE ARTHROPLASTY Left 09/29/2013   Procedure: TOTAL KNEE ARTHROPLASTY;  Surgeon: Alta Corning, MD;  Location: [facility];  Service: Orthopedics;  Laterality: Left;     reports that he quit smoking about 44 years ago. He has a 30.00 pack-year smoking history. He has quit using smokeless tobacco.  His smokeless tobacco use included chew. He reports that he does not drink alcohol or use drugs.  No Known Allergies  Family History  Problem Relation Age of Onset  . Heart attack Brother 35       possible, not confirmed  . Unexplained death Mother 21  . Cancer Father 53    Prior to Admission medications   Medication Sig Start Date End Date Taking? Authorizing Provider  acetaminophen (TYLENOL) 500 MG tablet Take 1,000 mg by mouth every 6 (six) hours as needed for mild pain or moderate pain.    [provider]  aspirin EC 81 MG tablet Take 1 tablet (81 mg total) by mouth daily. 06/17/17  Almyra Deforest, [place]  doxycycline (VIBRA-TABS) 100 MG tablet Take 100 mg by mouth 2 (two) times daily. Finishes on 03/11/18    [provider]  levofloxacin (LEVAQUIN) 750 MG tablet Take 1 tablet (750 mg total) by mouth daily. 03/09/18   Ladell Pier, MD  metoprolol tartrate (LOPRESSOR) 25 MG tablet Take 1 tablet (25 mg total) by mouth 2 (two) times daily. 05/07/17   Minus Breeding, MD  nitroGLYCERIN (NITROSTAT) 0.4 MG SL tablet Place 1 tablet (0.4 mg total)  under the tongue as needed. 05/01/17   Barrett, Evelene Croon, PA-C  ondansetron (ZOFRAN-ODT) 8 MG disintegrating tablet Take 1 tablet (8 mg total) by mouth every 8 (eight) hours as needed for nausea or vomiting. 01/08/18   Owens Shark, NP  Polyethyl Glycol-Propyl Glycol (SYSTANE OP) Apply 1 drop to eye daily as needed (dry eyes).    [provider]  predniSONE (DELTASONE) 20 MG tablet 20 mg. 02/07/18   [provider]  prochlorperazine (COMPAZINE) 10 MG tablet Take 1 tablet (10 mg total) by mouth every 6 (six) hours as needed for nausea or vomiting. 12/26/17   Ladell Pier, MD  tamsulosin (FLOMAX) 0.4 MG CAPS capsule Take 0.4 mg by mouth at bedtime. 01/21/18   [provider]  traMADol (ULTRAM) 50 MG tablet Take 1 tablet (50 mg total) by mouth every 6 (six) hours as needed. 02/20/18   Ladell Pier, MD    Physical Exam:  Physical Exam  Constitutional: He is oriented to person, place, and time. No distress.  HENT:  Mouth/Throat: Oropharynx is clear and moist.  Eyes: Pupils are equal, round, and reactive to light. Conjunctivae and EOM are normal.  Neck: Normal range of motion. No JVD present.  Cardiovascular: Normal rate, regular rhythm and normal heart sounds.  No murmur heard. Pulmonary/Chest: Effort normal. No respiratory distress. He has decreased breath sounds in the right upper field. He has no wheezes. He has no rhonchi. He has no rales.  Abdominal: Soft. Bowel sounds are normal. He exhibits no distension. There is no tenderness. There is no rebound and no guarding.  Musculoskeletal: Normal range of motion. He exhibits no edema or tenderness.       Right lower leg: He exhibits no edema.       Left lower leg: He exhibits no edema.  Lymphadenopathy:    He has no cervical adenopathy.  Neurological: He is alert and oriented to person, place, and time.  Skin: Skin is warm and dry. He is not diaphoretic.     Labs on Admission: I have personally reviewed  following labs and imaging studies  CBC: Recent Labs  Lab 03/05/18 1039  WBC 13.5*  NEUTROABS 10.5*  HGB 10.2*  HCT 31.6*  MCV 87.5  PLT 415*    Basic Metabolic Panel: Recent Labs  Lab 03/05/18 1039  NA 137  K 4.3  CL 105  CO2 20*  GLUCOSE 174*  BUN 19  CREATININE 0.89  CALCIUM 8.6*    GFR: Estimated Creatinine Clearance: 54.7 mL/min (by C-G formula based on SCr of 0.89 mg/dL).  Liver Function Tests: Recent Labs  Lab 03/05/18 1039  AST 25  ALT 16  ALKPHOS 292*  BILITOT 0.4  PROT 6.2*  ALBUMIN 2.7*    Urine analysis:    Component Value Date/Time   COLORURINE YELLOW 09/22/2013 1106   APPEARANCEUR CLEAR 09/22/2013 1106   LABSPEC 1.026 09/22/2013 1106   PHURINE 5.5 09/22/2013 1106   GLUCOSEU NEGATIVE 09/22/2013 1106  HGBUR NEGATIVE 09/22/2013 1106   [place] 09/22/2013 1106   [place] 09/22/2013 1106   PROTEINUR NEGATIVE 09/22/2013 1106   UROBILINOGEN 0.2 09/22/2013 1106   NITRITE NEGATIVE 09/22/2013 1106   [place] 09/22/2013 1106     Radiological Exams on Admission: No results found.  EKG: Independently reviewed. None available  Assessment/Plan Principal Problem:   Dyspnea on exertion Active Problems:   Community acquired pneumonia of right upper lobe of lung (HCC)    Dyspnea on exertion No associated symptoms. Recent Transthoracic Echocardiogram in September significant for normal EF with evidence of grade 1 diastolic dysfunction (mild diffuse hypokinesis). Recent BNP of 178 six days ago. CT angio of the chest on 03/05/18 significant for RUL pneumonia and no evidence of acute PE. Recent TSH normal. -Chest x-ray -PT eval -CMP, CBC, BNP pending -Ambulate with pulse oximetry -Transthoracic Echocardiogram  RUL pneumonia Patient started on doxycycline on 10/24 and Levaquin added on 10/28. Symptoms include cough without sputum production. -Continue Levaquin (Day #3), stop Doxycycline -Sputum culture if  able -urine strep/legionella antigen -Repeat chest x-ray as mentioned above  CAD History of MI Hyperlipidemia Patient is s/p CABG of LM in 2007. Recent nuclear stress test in 06/2017 significant for low risk study with mildly decreased EF. EF on recent Transthoracic Echocardiogram in 01/2018 normal. Follows with [facility]. Currently not on a statin. -Continue aspirin  Essential hypertension -Continue metoprolol  Non-hodgkin lymphoma Patient previously completed chemotherapy with remission. Recurrence noted this year and he has been started on chemotherapy to which his appears to be responding    DVT prophylaxis: Lovenox Code Status: Full code Family Communication: None at bedside Disposition Plan: Medical floor Consults called: None Admission status: Observation   Cordelia Poche, MD [facility] 03/11/2018, 10:01 AM  If 7PM-7AM, please contact night-coverage [email or web address] Password [email or web address]

## 2018-03-11 NOTE — Telephone Encounter (Signed)
No LOS 10/30

## 2018-03-12 ENCOUNTER — Inpatient Hospital Stay (HOSPITAL_COMMUNITY): Payer: Medicare Other

## 2018-03-12 DIAGNOSIS — J9601 Acute respiratory failure with hypoxia: Secondary | ICD-10-CM | POA: Diagnosis not present

## 2018-03-12 DIAGNOSIS — Z8572 Personal history of non-Hodgkin lymphomas: Secondary | ICD-10-CM | POA: Diagnosis not present

## 2018-03-12 DIAGNOSIS — Z951 Presence of aortocoronary bypass graft: Secondary | ICD-10-CM | POA: Diagnosis not present

## 2018-03-12 DIAGNOSIS — R627 Adult failure to thrive: Secondary | ICD-10-CM

## 2018-03-12 DIAGNOSIS — Z66 Do not resuscitate: Secondary | ICD-10-CM | POA: Diagnosis not present

## 2018-03-12 DIAGNOSIS — I2510 Atherosclerotic heart disease of native coronary artery without angina pectoris: Secondary | ICD-10-CM | POA: Diagnosis not present

## 2018-03-12 DIAGNOSIS — R112 Nausea with vomiting, unspecified: Secondary | ICD-10-CM

## 2018-03-12 DIAGNOSIS — R7989 Other specified abnormal findings of blood chemistry: Secondary | ICD-10-CM | POA: Diagnosis not present

## 2018-03-12 DIAGNOSIS — J189 Pneumonia, unspecified organism: Secondary | ICD-10-CM | POA: Diagnosis not present

## 2018-03-12 DIAGNOSIS — R1312 Dysphagia, oropharyngeal phase: Secondary | ICD-10-CM | POA: Diagnosis not present

## 2018-03-12 DIAGNOSIS — E43 Unspecified severe protein-calorie malnutrition: Secondary | ICD-10-CM | POA: Diagnosis not present

## 2018-03-12 DIAGNOSIS — R0600 Dyspnea, unspecified: Secondary | ICD-10-CM | POA: Diagnosis not present

## 2018-03-12 DIAGNOSIS — C8590 Non-Hodgkin lymphoma, unspecified, unspecified site: Secondary | ICD-10-CM | POA: Diagnosis not present

## 2018-03-12 DIAGNOSIS — I10 Essential (primary) hypertension: Secondary | ICD-10-CM | POA: Diagnosis not present

## 2018-03-12 DIAGNOSIS — N400 Enlarged prostate without lower urinary tract symptoms: Secondary | ICD-10-CM | POA: Diagnosis present

## 2018-03-12 DIAGNOSIS — Z6820 Body mass index (BMI) 20.0-20.9, adult: Secondary | ICD-10-CM

## 2018-03-12 DIAGNOSIS — M6281 Muscle weakness (generalized): Secondary | ICD-10-CM | POA: Diagnosis not present

## 2018-03-12 DIAGNOSIS — J181 Lobar pneumonia, unspecified organism: Secondary | ICD-10-CM | POA: Diagnosis not present

## 2018-03-12 DIAGNOSIS — R0609 Other forms of dyspnea: Secondary | ICD-10-CM | POA: Diagnosis not present

## 2018-03-12 DIAGNOSIS — I214 Non-ST elevation (NSTEMI) myocardial infarction: Secondary | ICD-10-CM | POA: Diagnosis not present

## 2018-03-12 DIAGNOSIS — I2119 ST elevation (STEMI) myocardial infarction involving other coronary artery of inferior wall: Secondary | ICD-10-CM | POA: Diagnosis not present

## 2018-03-12 DIAGNOSIS — C8598 Non-Hodgkin lymphoma, unspecified, lymph nodes of multiple sites: Secondary | ICD-10-CM | POA: Diagnosis not present

## 2018-03-12 DIAGNOSIS — I5032 Chronic diastolic (congestive) heart failure: Secondary | ICD-10-CM | POA: Diagnosis not present

## 2018-03-12 DIAGNOSIS — M1712 Unilateral primary osteoarthritis, left knee: Secondary | ICD-10-CM

## 2018-03-12 DIAGNOSIS — K5900 Constipation, unspecified: Secondary | ICD-10-CM | POA: Diagnosis not present

## 2018-03-12 DIAGNOSIS — B370 Candidal stomatitis: Secondary | ICD-10-CM | POA: Diagnosis not present

## 2018-03-12 DIAGNOSIS — E785 Hyperlipidemia, unspecified: Secondary | ICD-10-CM | POA: Diagnosis not present

## 2018-03-12 DIAGNOSIS — D701 Agranulocytosis secondary to cancer chemotherapy: Secondary | ICD-10-CM

## 2018-03-12 DIAGNOSIS — I351 Nonrheumatic aortic (valve) insufficiency: Secondary | ICD-10-CM | POA: Diagnosis not present

## 2018-03-12 DIAGNOSIS — Z85828 Personal history of other malignant neoplasm of skin: Secondary | ICD-10-CM | POA: Diagnosis not present

## 2018-03-12 DIAGNOSIS — Z23 Encounter for immunization: Secondary | ICD-10-CM | POA: Diagnosis not present

## 2018-03-12 DIAGNOSIS — Z9181 History of falling: Secondary | ICD-10-CM | POA: Diagnosis not present

## 2018-03-12 DIAGNOSIS — K5903 Drug induced constipation: Secondary | ICD-10-CM | POA: Diagnosis not present

## 2018-03-12 DIAGNOSIS — I5021 Acute systolic (congestive) heart failure: Secondary | ICD-10-CM | POA: Diagnosis not present

## 2018-03-12 DIAGNOSIS — R262 Difficulty in walking, not elsewhere classified: Secondary | ICD-10-CM | POA: Diagnosis not present

## 2018-03-12 DIAGNOSIS — Z79899 Other long term (current) drug therapy: Secondary | ICD-10-CM | POA: Diagnosis not present

## 2018-03-12 DIAGNOSIS — C8330 Diffuse large B-cell lymphoma, unspecified site: Secondary | ICD-10-CM | POA: Diagnosis not present

## 2018-03-12 DIAGNOSIS — I110 Hypertensive heart disease with heart failure: Secondary | ICD-10-CM | POA: Diagnosis not present

## 2018-03-12 DIAGNOSIS — I340 Nonrheumatic mitral (valve) insufficiency: Secondary | ICD-10-CM | POA: Diagnosis not present

## 2018-03-12 DIAGNOSIS — I252 Old myocardial infarction: Secondary | ICD-10-CM | POA: Diagnosis not present

## 2018-03-12 DIAGNOSIS — R410 Disorientation, unspecified: Secondary | ICD-10-CM | POA: Diagnosis not present

## 2018-03-12 DIAGNOSIS — Z7982 Long term (current) use of aspirin: Secondary | ICD-10-CM | POA: Diagnosis not present

## 2018-03-12 LAB — RESPIRATORY PANEL BY PCR

## 2018-03-12 LAB — PROCALCITONIN: Procalcitonin: 0.1 ng/mL

## 2018-03-12 LAB — ECHOCARDIOGRAM COMPLETE
Height: 66 in
Weight: 2028.8 oz

## 2018-03-12 LAB — INFLUENZA PANEL BY PCR (TYPE A & B)
Influenza A By PCR: NEGATIVE
Influenza B By PCR: NEGATIVE

## 2018-03-12 LAB — MRSA PCR SCREENING: MRSA by PCR: NEGATIVE

## 2018-03-12 LAB — STREP PNEUMONIAE URINARY ANTIGEN: STREP PNEUMO URINARY ANTIGEN: NEGATIVE

## 2018-03-12 MED ORDER — VANCOMYCIN HCL IN DEXTROSE 1-5 GM/200ML-% IV SOLN
1000.0000 mg | Freq: Once | INTRAVENOUS | Status: AC
  Administered 2018-03-12: 1000 mg via INTRAVENOUS
  Filled 2018-03-12: qty 200

## 2018-03-12 MED ORDER — VANCOMYCIN HCL IN DEXTROSE 750-5 MG/150ML-% IV SOLN
750.0000 mg | INTRAVENOUS | Status: DC
  Filled 2018-03-12: qty 150

## 2018-03-12 MED ORDER — SODIUM CHLORIDE 0.9 % IV SOLN
2.0000 g | Freq: Three times a day (TID) | INTRAVENOUS | Status: DC
  Filled 2018-03-12: qty 2

## 2018-03-12 MED ORDER — LEVOFLOXACIN 750 MG PO TABS: 750.0000 mg | ORAL_TABLET | ORAL | Status: DC

## 2018-03-12 MED ORDER — SODIUM CHLORIDE 0.9 % IV SOLN
1.0000 g | Freq: Two times a day (BID) | INTRAVENOUS | Status: DC
  Administered 2018-03-12 – 2018-03-15 (×6): 1 g via INTRAVENOUS
  Filled 2018-03-12 (×9): qty 1

## 2018-03-12 MED ORDER — SODIUM CHLORIDE 0.9% FLUSH
10.0000 mL | INTRAVENOUS | Status: DC | PRN
  Administered 2018-03-15: 10 mL
  Filled 2018-03-12: qty 40

## 2018-03-12 NOTE — Progress Notes (Signed)
  Echocardiogram 2D Echocardiogram has been performed.  David Little 03/12/2018, 1:29 PM

## 2018-03-12 NOTE — Progress Notes (Signed)
PHARMACY NOTE:  ANTIMICROBIAL RENAL DOSAGE ADJUSTMENT  Current antimicrobial regimen includes a mismatch between antimicrobial dosage and estimated renal function.  As per policy approved by the Pharmacy & Therapeutics and Medical Executive Committees, the antimicrobial dosage will be adjusted accordingly.  Current antimicrobial dosage:  Levaquin 750mg  PO daily  Indication: PNA  Renal Function:  Estimated Creatinine Clearance: 43.9 mL/min (by C-G formula based on SCr of 1.11 mg/dL). []      On intermittent HD, scheduled: []      On CRRT    Antimicrobial dosage has been changed to:  Levaquin 750mg  PO q48  Additional comments:   Thank you for allowing pharmacy to be a part of this patient's care.  Kara Mead, [facility] 03/12/2018 10:36 AM

## 2018-03-12 NOTE — Evaluation (Signed)
Physical Therapy Evaluation Patient Details Name: David Little MRN: [id number] DOB: January 12, 1936 Today's Date: 03/12/2018   History of Present Illness  David Little is a 82 y.o. male with medical history significant of non-hodgkin lymphoma, hyperlipidemia, CAD s/p CABG, L TKA. adm with Dyspnea on exertion, flu negative, possible PNA  Clinical Impression  Pt admitted with above diagnosis. Pt currently with functional limitations due to the deficits listed below (see PT Problem List).  Pt significantly deconditioned, reports 2 falls in the last month; at time of PT eval pt unable to transfer or amb d/t SOB/severe DOE (SpO2=100% on RA); pt reports he was able to amb to the bathroom earlier but is now too weak to take any steps; recommend SNF at this time given recent falls and pt dyspnea/level of fatigue with minimal exertion; will continue to follow and update PT POC/ D/C plan as indicated;   Pt will benefit from skilled PT to increase their independence and safety with mobility to allow discharge to the venue listed below.       Follow Up Recommendations SNF(vs HHPT pending progress and LOS)    Equipment Recommendations  None recommended by PT    Recommendations for Other Services       Precautions / Restrictions Precautions Precautions: Fall      Mobility  Bed Mobility Overal bed mobility: Needs Assistance Bed Mobility: Supine to Sit;Sit to Supine     Supine to sit: Supervision Sit to supine: Min guard   General bed mobility comments: incr time, reliant on bed rail; rest breaks needed d/t dyspnea;  SpO2= 100% on RA; min/guard and incr time to return to supine  Transfers Overall transfer level: Needs assistance Equipment used: Rolling walker (2 wheeled) Transfers: Sit to/from Stand Sit to Stand: Min assist;Min guard         General transfer comment: light assist to rise and stabilize, cues for hand placement and safety--pt prefers to pull up on  RW  Ambulation/Gait             General Gait Details: unable d/t DOE/SOB  Stairs            Wheelchair Mobility    Modified Rankin (Stroke Patients Only)       Balance Overall balance assessment: Needs assistance;History of Falls(2 recent falls (in the last month)) Sitting-balance support: Feet supported;No upper extremity supported Sitting balance-Leahy Scale: Fair Sitting balance - Comments: at least fair   Standing balance support: Bilateral upper extremity supported Standing balance-Leahy Scale: Poor Standing balance comment: reliant  on UEs                             Pertinent Vitals/Pain Pain Assessment: No/denies pain    Home Living Family/patient expects to be discharged to:: Private residence Living Arrangements: Spouse/significant other Available Help at Discharge: Available 24 hours/day;Family Type of Home: House Home Access: Stairs to enter   [employer] of Steps: 1 Home Layout: One level Home Equipment: Walker - 2 wheels      Prior Function           Comments: recently having more difficulty with ADLS and mobility; 2 recent falls, using RW prior to admission d/t weakness     Hand Dominance        Extremity/Trunk Assessment   Upper Extremity Assessment Upper Extremity Assessment: Generalized weakness    Lower Extremity Assessment Lower Extremity Assessment: Generalized weakness  Communication   Communication: No difficulties  Cognition Arousal/Alertness: Awake/alert Behavior During Therapy: WFL for tasks assessed/performed Overall Cognitive Status: Within Functional Limits for tasks assessed                                        General Comments      Exercises     Assessment/Plan    PT Assessment Patient needs continued PT services  PT Problem List Decreased strength;Decreased mobility;Decreased activity tolerance;Decreased balance;Decreased knowledge of use of  DME;Cardiopulmonary status limiting activity       PT Treatment Interventions DME instruction;Therapeutic activities;Gait training;Functional mobility training;Therapeutic exercise;Patient/family education;Balance training    PT Goals (Current goals can be found in the Care Plan section)  Acute Rehab PT Goals Time For Goal Achievement: 03/26/18 Potential to Achieve Goals: Good    Frequency Min 3X/week   Barriers to discharge        Co-evaluation               AM-PAC PT "6 Clicks" Daily Activity  Outcome Measure Difficulty turning over in bed (including adjusting bedclothes, sheets and blankets)?: A Lot Difficulty moving from lying on back to sitting on the side of the bed? : Unable Difficulty sitting down on and standing up from a chair with arms (e.g., wheelchair, bedside commode, etc,.)?: Unable Help needed moving to and from a bed to chair (including a wheelchair)?: A Little Help needed walking in hospital room?: A Lot Help needed climbing 3-5 steps with a railing? : A Lot 6 Click Score: 11    End of Session Equipment Utilized During Treatment: Gait belt Activity Tolerance: Patient tolerated treatment well Patient left: with call bell/phone within reach;in bed;with bed alarm set   PT Visit Diagnosis: Muscle weakness (generalized) (M62.81);Difficulty in walking, not elsewhere classified (R26.2)    Time: 5701-7793 PT Time Calculation (min) (ACUTE ONLY): 22 min   Charges:   PT Evaluation $PT Eval Low Complexity: 1 Low          Kenyon Ana, PT  Pager: [phone] Acute Rehab Dept [facility]): [phone]   03/12/2018   [facility] 03/12/2018, 3:45 PM

## 2018-03-12 NOTE — Progress Notes (Signed)
SATURATION QUALIFICATIONS: (This note is used to comply with regulatory documentation for home oxygen)  Patient Saturations on Room Air at Rest = 92%  Patient Saturations on Room Air while Ambulating = 88%  Patient Saturations on  Liters of oxygen while Ambulating = %  Please briefly explain why patient needs home oxygen: 

## 2018-03-12 NOTE — Progress Notes (Signed)
Pharmacy Antibiotic Note  David Little is a 82 y.o. male admitted on 03/11/2018 with pneumonia.  Pharmacy has been consulted for Vanc/cefepime dosing.  Plan: 1) Vancomycin 1g IV x1 then 750mg  IV q24 - goal AUC 400-500 2) Change cefepime from 2g IV q8 to 1gt IV q12 per current renal function     Temp (24hrs), Avg:97.8 F (36.6 C), Min:97.5 F (36.4 C), Max:98 F (36.7 C)  Recent Labs  Lab 03/11/18 1843  WBC 16.6*  CREATININE 1.11    Estimated Creatinine Clearance: 43.9 mL/min (by C-G formula based on SCr of 1.11 mg/dL).    No Known Allergies  Thank you for allowing pharmacy to be a part of this patient's care.  Kara Mead 03/12/2018 12:13 PM

## 2018-03-12 NOTE — Progress Notes (Signed)
IP PROGRESS NOTE  Subjective:   David Little was admitted yesterday with severe exertional dyspnea.  He reports no improvement.  He has a nonproductive cough.  Objective: Vital signs in last 24 hours: Blood pressure 107/65, pulse 89, temperature (!) 97.3 F (36.3 C), temperature source Oral, resp. rate 20, SpO2 100 %.  Intake/Output from previous day: 10/30 0701 - 10/31 0700 In: 47 [P.O.:60] Out: -   Physical Exam:  HEENT: No thrush or ulcers Lungs: Clear bilaterally Cardiac: Regular rate and rhythm Abdomen: No hepatomegaly, no mass, nontender Extremities: No leg edema   Portacath/PICC-without erythema  Lab Results: Recent Labs    03/11/18 1843  WBC 16.6*  HGB 10.3*  HCT 32.8*  PLT 405*    BMET Recent Labs    03/11/18 1843  NA 137  K 4.2  CL 106  CO2 21*  GLUCOSE 116*  BUN 29*  CREATININE 1.11  CALCIUM 8.6*    No results found for: CEA1  Studies/Results: Dg Chest 2 View  Result Date: 03/11/2018 CLINICAL DATA:  Dyspnea on exertion, shortness of breath. History of lymphoma. EXAM: CHEST - 2 VIEW COMPARISON:  CT chest March 05, 2018 FINDINGS: Cardiomediastinal silhouette is normal. Status post median sternotomy. Mildly calcified aortic arch. No pleural effusions or focal consolidations. Mild chronic interstitial changes. Trachea projects midline and there is no pneumothorax. Soft tissue planes and included osseous structures are non-suspicious. Single-lumen RIGHT chest Port-A-Cath in situ. IMPRESSION: 1. Mild chronic interstitial changes without focal consolidation. 2.  Aortic Atherosclerosis (ICD10-I70.0). Electronically Signed   By: Elon Alas M.D.   On: 03/11/2018 22:42    Medications: I have reviewed the patient's current medications.  Assessment/Plan: 1. Non-Hodgkin lymphoma - he completed fludarabine chemotherapy in February 2003. He remains in clinical remission.  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 Indeterminate lesion in the left liver, tubular structure in the right lower quadrant concerning for mucocele, indeterminate left lower lobe nodule  CT biopsy of retroperitoneal mass 12/25/2017-diffuse large B-cell lymphoma, CD20 positive  Cycle 1 CHOP/Rituxan 01/01/2018  PET scan 08/08/9240-[facility] hypermetabolic retroperitoneal and mesenteric mass; mild thoracic involvement including a hypermetabolic nodule in the suprasternal notch and a small hypermetabolic left periaortic lymph node;musculoskeletal involvement including the left T2 vertebrae, potentially the right distal medial sternocleidomastoid muscle and the right humeral shaft; approximately 5 hypermetabolic liver lesions; accentuated activity near the orifice of the appendix; other questionable activity along the gastrointestinal tract including scattered esophageal activity and in the proximal stomach without definite correlating CT abnormalities; left hydronephrosis and hydroureter extending down to the somewhat distended urinary bladder which demonstrates wall thickening and trabeculation.  Cycle 2 CHOP/Rituxan 01/22/2018 (Adriamycin and Cytoxan dose reduced due to severe neutropenia following cycle 1)  Cycle 3 CHOP/rituximab 02/12/2018  Decreased retroperitoneal mass on CT chest 03/05/2018  2. History of coronary artery disease - status post coronary artery bypass surgery. 3. Pneumococcal vaccine in April 2014 4. Left knee pain and swelling-? Left knee arthritis 5.Hypercalcemia 12/19/2017, status post Zometa 12/19/2017-improved 6.Elevated creatininesecondary to obstructing left peritoneal mass  CT 12/24/2017-left hydronephrosis secondary to obstructing retroperitoneal mass 7.Anorexia/weight  loss/failure to thrive 8.Echocardiogram 12/31/2017-LVEF 50-55%, mild diffuse hypokinesis 9.Neutropenia secondary to chemotherapy. He will begin ciprofloxacin. 10.Nausea/vomiting, question delayed nausea related to chemotherapy. IV fluids, Zofran/dexamethasone; Zofran as needed at home. 11.Constipation, question secondary  to chemotherapy. Begin MiraLAX. 12.Oral candidiasis, question early mucositis. He completeda course of Diflucan.Resolved. 13.  Exertional dyspnea, cough, failure to thrive 03/05/2018-CT chest revealed right upper lobe pneumonia, no evidence of pulmonary embolus but decreased retroperitoneal mass   David Little has persistent severe exertional dyspnea.  He was diagnosed with right upper lobe pneumonia last week, but the dyspnea has persisted despite antibiotic therapy.  He is scheduled for an echocardiogram this morning. I doubt the dyspnea is directly related to lymphoma or drug-induced pneumonitis.  I suspect the dyspnea secondary to pneumonia or heart failure.  Recommendations: 1.  Echocardiogram today 2.  Consider a pulmonary evaluation if the echocardiogram is unremarkable    LOS: 1 day   Betsy Coder, MD   03/12/2018, 5:05 PM

## 2018-03-12 NOTE — Progress Notes (Signed)
PROGRESS NOTE    David Little  FVC:[id number] DOB: 1935-09-09 DOA: 03/11/2018 PCP: Chipper Herb, MD   Brief Narrative:  82 year old with past medical history relevant for coronary artery disease status post CABG, BPH, non-Hodgkin's lymphoma with primarily bulky retroperitoneal disease status post 3 cycles of CHOP/rituximab who presents with dyspnea on exertion and found to be hypoxic with possible pneumonia on CT scan.   Assessment & Plan:   Principal Problem:   Dyspnea on exertion Active Problems:   Community acquired pneumonia of right upper lobe of lung ([place])   Essential hypertension   #) Acute hypoxic respiratory failure due to pneumonia: At this time he has likely community acquired pneumonia in the setting of significant chemotherapy and immunosuppression.  Failed outpatient therapy with doxycycline and oral levofloxacin.  CT scan on admission showed concern for pneumonia as well as atypical inflammation.   -Start IV cefepime and vancomycin 03/12/2018 - We will check procalcitonin -Respiratory virus panel and flu panel Exline-blood cultures ordered 03/12/2018  #) Coronary artery disease status post CABG: This was in 2007 for left main disease.   -continue aspirin 81 mg -Continue metoprolol tartrate 20 mg twice daily  #) BPH: -Continue tamsulosin 0.4 mg daily  Fluids: Tolerating p.o. Electrodes: Monitor and supplement Nutrition: Regular diet  Prophylaxis: Enoxaparin  Disposition: Pending resolution of hypoxia and oral antibiotics  Full code   Consultants:   Oncology  Procedures:   Echo 03/12/2018 pending  Antimicrobials:   IV cefepime and vancomycin started 03/12/2018   Subjective: Patient reports he is feeling somewhat better.  He denies any nausea, vomiting, diarrhea, cough, congestion.  He does not have any chest pain.  Objective: Vitals:   03/11/18 1557 03/12/18 0329 03/12/18 0922  BP: 116/62 111/68   Pulse: 76 91   Resp: 16 (!) 21     Temp: (!) 97.5 F (36.4 C) 98 F (36.7 C)   TempSrc: Oral Oral   SpO2:  97% (!) 88%    Intake/Output Summary (Last 24 hours) at 03/12/2018 1129 Last data filed at 03/12/2018 6384 Gross per 24 hour  Intake 120 ml  Output -  Net 120 ml   There were no vitals filed for this visit.  Examination:  General exam: Appears calm and comfortable  Respiratory system: Clear to auscultation.  Scattered rhonchi worse on right Cardiovascular system: Regular rate and rhythm, no murmurs Gastrointestinal system: Soft, nondistended, nontender, plus bowel sounds Central nervous system: Alert and oriented.  Grossly intact, moving all extremities Extremities: No lower external edema Skin: No rashes over visible skin Psychiatry: Judgement and insight appear normal. Mood & affect appropriate.     Data Reviewed: I have personally reviewed following labs and imaging studies  CBC: Recent Labs  Lab 03/11/18 1843  WBC 16.6*  HGB 10.3*  HCT 32.8*  MCV 87.9  PLT 665*   Basic Metabolic Panel: Recent Labs  Lab 03/11/18 1843  NA 137  K 4.2  CL 106  CO2 21*  GLUCOSE 116*  BUN 29*  CREATININE 1.11  CALCIUM 8.6*   GFR: Estimated Creatinine Clearance: 43.9 mL/min (by C-G formula based on SCr of 1.11 mg/dL). Liver Function Tests: Recent Labs  Lab 03/11/18 1843  AST 37  ALT 21  ALKPHOS 339*  BILITOT 0.5  PROT 6.3*  ALBUMIN 3.0*   No results for input(s): LIPASE, AMYLASE in the last 168 hours. No results for input(s): AMMONIA in the last 168 hours. Coagulation Profile: No results for input(s): INR, PROTIME in the  last 168 hours. Cardiac Enzymes: No results for input(s): CKTOTAL, CKMB, CKMBINDEX, TROPONINI in the last 168 hours. BNP (last 3 results) No results for input(s): PROBNP in the last 8760 hours. HbA1C: No results for input(s): HGBA1C in the last 72 hours. CBG: No results for input(s): GLUCAP in the last 168 hours. Lipid Profile: No results for input(s): CHOL, HDL,  LDLCALC, TRIG, CHOLHDL, LDLDIRECT in the last 72 hours. Thyroid Function Tests: No results for input(s): TSH, T4TOTAL, FREET4, T3FREE, THYROIDAB in the last 72 hours. Anemia Panel: No results for input(s): VITAMINB12, FOLATE, FERRITIN, TIBC, IRON, RETICCTPCT in the last 72 hours. Sepsis Labs: No results for input(s): PROCALCITON, LATICACIDVEN in the last 168 hours.  No results found for this or any previous visit (from the past 240 hour(s)).       Radiology Studies: Dg Chest 2 View  Result Date: 03/11/2018 CLINICAL DATA:  Dyspnea on exertion, shortness of breath. History of lymphoma. EXAM: CHEST - 2 VIEW COMPARISON:  CT chest March 05, 2018 FINDINGS: Cardiomediastinal silhouette is normal. Status post median sternotomy. Mildly calcified aortic arch. No pleural effusions or focal consolidations. Mild chronic interstitial changes. Trachea projects midline and there is no pneumothorax. Soft tissue planes and included osseous structures are non-suspicious. Single-lumen RIGHT chest Port-A-Cath in situ. IMPRESSION: 1. Mild chronic interstitial changes without focal consolidation. 2.  Aortic Atherosclerosis (ICD10-I70.0). Electronically Signed   By: Elon Alas M.D.   On: 03/11/2018 22:42        Scheduled Meds: . aspirin EC  81 mg Oral Daily  . enoxaparin (LOVENOX) injection  40 mg Subcutaneous Q24H  . [START ON 03/14/2018] levofloxacin  750 mg Oral Q48H  . metoprolol tartrate  25 mg Oral BID  . sodium chloride flush  10-40 mL Intracatheter Q12H  . tamsulosin  0.4 mg Oral QHS   Continuous Infusions:   LOS: 1 day    Time spent: [place], MD [facility]  If 7PM-7AM, please contact night-coverage [email or web address] Password [email or web address] 03/12/2018, 11:29 AM

## 2018-03-13 ENCOUNTER — Inpatient Hospital Stay (HOSPITAL_COMMUNITY): Payer: Medicare Other

## 2018-03-13 ENCOUNTER — Other Ambulatory Visit: Payer: Self-pay

## 2018-03-13 DIAGNOSIS — I340 Nonrheumatic mitral (valve) insufficiency: Secondary | ICD-10-CM

## 2018-03-13 DIAGNOSIS — R7989 Other specified abnormal findings of blood chemistry: Secondary | ICD-10-CM

## 2018-03-13 DIAGNOSIS — I351 Nonrheumatic aortic (valve) insufficiency: Secondary | ICD-10-CM

## 2018-03-13 LAB — LEGIONELLA PNEUMOPHILA SEROGP 1 UR AG: L. PNEUMOPHILA SEROGP 1 UR AG: NEGATIVE

## 2018-03-13 LAB — TROPONIN I
Troponin I: 0.25 ng/mL (ref <0.03)
Troponin I: 0.23 ng/mL (ref <0.03)
Troponin I: 0.27 ng/mL (ref <0.03)

## 2018-03-13 LAB — ECHOCARDIOGRAM LIMITED
HEIGHTINCHES: 66 in
Weight: 2028.8 oz

## 2018-03-13 LAB — CREATININE, SERUM
Creatinine, Ser: 1.04 mg/dL (ref 0.61–1.24)
GFR calc Af Amer: >60 mL/min (ref >60)
GFR calc non Af Amer: >60 mL/min (ref >60)

## 2018-03-13 MED ORDER — BOOST PLUS PO LIQD
237.0000 mL | Freq: Three times a day (TID) | ORAL | Status: DC
  Administered 2018-03-13 – 2018-03-19 (×14): 237 mL via ORAL
  Filled 2018-03-13 (×19): qty 237

## 2018-03-13 MED ORDER — ENOXAPARIN SODIUM 40 MG/0.4ML ~~LOC~~ SOLN
40.0000 mg | SUBCUTANEOUS | Status: DC
  Administered 2018-03-13 – 2018-03-18 (×6): 40 mg via SUBCUTANEOUS
  Filled 2018-03-13 (×6): qty 0.4

## 2018-03-13 MED ORDER — PERFLUTREN LIPID MICROSPHERE
1.0000 mL | INTRAVENOUS | Status: AC | PRN
  Administered 2018-03-13: 3 mL via INTRAVENOUS
  Filled 2018-03-13: qty 10

## 2018-03-13 MED ORDER — HEPARIN BOLUS VIA INFUSION
3000.0000 [IU] | Freq: Once | INTRAVENOUS | Status: DC
  Filled 2018-03-13: qty 3000

## 2018-03-13 MED ORDER — HEPARIN (PORCINE) IN NACL 100-0.45 UNIT/ML-% IJ SOLN
700.0000 [IU]/h | INTRAMUSCULAR | Status: DC
  Filled 2018-03-13: qty 250

## 2018-03-13 NOTE — Progress Notes (Signed)
PT Cancellation Note  Patient Details Name: David Little MRN: [id number] DOB: 12/31/1935   Cancelled Treatment:    Reason Eval/Treat Not Completed: Other (comment).  Asked PT to return at another time.  Follow up later today.   Ramond Dial 03/13/2018, 12:41 PM   Mee Hives, PT MS Acute Rehab Dept. Number: [place]

## 2018-03-13 NOTE — Progress Notes (Signed)
IP PROGRESS NOTE  Subjective:   David Little reports partial improvement in the cough.  On ambulating. Objective: Vital signs in last 24 hours: Blood pressure 116/61, pulse 100, temperature 98.1 F (36.7 C), temperature source Oral, resp. rate (!) 22, SpO2 98 %.  Intake/Output from previous day: 10/31 0701 - 11/01 0700 In: 334.6 [P.O.:230; IV Piggyback:104.6] Out: 750 [Urine:750]  Physical Exam: Not performed today    Portacath/PICC-without erythema  Lab Results: Recent Labs    03/11/18 1843  WBC 16.6*  HGB 10.3*  HCT 32.8*  PLT 405*    BMET Recent Labs    03/11/18 1843 03/13/18 0515  NA 137  --   K 4.2  --   CL 106  --   CO2 21*  --   GLUCOSE 116*  --   BUN 29*  --   CREATININE 1.11 1.04  CALCIUM 8.6*  --     No results found for: CEA1  Studies/Results: Dg Chest 2 View  Result Date: 03/11/2018 CLINICAL DATA:  Dyspnea on exertion, shortness of breath. History of lymphoma. EXAM: CHEST - 2 VIEW COMPARISON:  CT chest March 05, 2018 FINDINGS: Cardiomediastinal silhouette is normal. Status post median sternotomy. Mildly calcified aortic arch. No pleural effusions or focal consolidations. Mild chronic interstitial changes. Trachea projects midline and there is no pneumothorax. Soft tissue planes and included osseous structures are non-suspicious. Single-lumen RIGHT chest Port-A-Cath in situ. IMPRESSION: 1. Mild chronic interstitial changes without focal consolidation. 2.  Aortic Atherosclerosis (ICD10-I70.0). Electronically Signed   By: Elon Alas M.D.   On: 03/11/2018 22:42    Medications: I have reviewed the patient's current medications.  Assessment/Plan: 1. Non-Hodgkin lymphoma - he completed fludarabine chemotherapy in February 2003. He remains in clinical remission.  11/22/1998-right submandibular gland-large B-cell lymphoma arising in setting of follicular lymphoma  Floor of mouthmass 09/16/2000-follicular center cell lymphoma, large cell type,  grade 3  CTs 12/24/2017-retroperitoneal mass encasing branch vessels of the aorta, left ureter, and portal venous confluence. Indeterminate lesion in the left liver, tubular structure in the right lower quadrant concerning for mucocele, indeterminate left lower lobe nodule  CT biopsy of retroperitoneal mass 12/25/2017-diffuse large B-cell lymphoma, CD20 positive  Cycle 1 CHOP/Rituxan 01/01/2018  PET scan 10/31/3084-[facility] hypermetabolic retroperitoneal and mesenteric mass; mild thoracic involvement including a hypermetabolic nodule in the suprasternal notch and a small hypermetabolic left periaortic lymph node;musculoskeletal involvement including the left T2 vertebrae, potentially the right distal medial sternocleidomastoid muscle and the right humeral shaft; approximately 5 hypermetabolic liver lesions; accentuated activity near the orifice of the appendix; other questionable activity along the gastrointestinal tract including scattered esophageal activity and in the proximal stomach without definite correlating CT abnormalities; left hydronephrosis and hydroureter extending down to the somewhat distended urinary bladder which demonstrates wall thickening and trabeculation.  Cycle 2 CHOP/Rituxan 01/22/2018 (Adriamycin and Cytoxan dose reduced due to severe neutropenia following cycle 1)  Cycle 3 CHOP/rituximab 02/12/2018  Decreased retroperitoneal mass on CT chest 03/05/2018  2. History of coronary artery disease - status post coronary artery bypass surgery. 3. Pneumococcal vaccine in April 2014 4. Left knee pain and swelling-? Left knee arthritis 5.Hypercalcemia 12/19/2017, status post Zometa 12/19/2017-improved 6.Elevated creatininesecondary to obstructing left peritoneal mass  CT 12/24/2017-left hydronephrosis secondary to obstructing retroperitoneal mass 7.Anorexia/weight loss/failure to thrive 8.Echocardiogram 12/31/2017-LVEF 50-55%, mild diffuse hypokinesis  Echocardiogram  03/12/2018- diffuse hypokinesis, moderately dilated cavity, estimated LVEF 45-50% 9.Neutropenia secondary to chemotherapy. He will begin ciprofloxacin. 10.Nausea/vomiting, question delayed nausea related to chemotherapy. IV fluids, Zofran/dexamethasone; Zofran  as needed at home. 11.Constipation, question secondary to chemotherapy. Begin MiraLAX. 12.Oral candidiasis, question early mucositis. He completeda course of Diflucan.Resolved. 13.  Exertional dyspnea, cough, failure to thrive 03/05/2018-CT chest revealed right upper lobe pneumonia, no evidence of pulmonary embolus but decreased retroperitoneal mass   David Little was admitted 03/11/2018 with respiratory failure.  He reports partial improvement in his dyspnea.  He is now being evaluated by cardiology for possible ischemic disease and heart failure.  Recommendations: 1.  Continue evaluation and management of the dyspnea per internal medicine and cardiology 2.  Continue antibiotics for pneumonia  Please call oncology as needed.  Outpatient follow-up will be scheduled at the Cancer center.   LOS: 2 days   Betsy Coder, MD   03/13/2018, 3:48 PM

## 2018-03-13 NOTE — Progress Notes (Signed)
ANTICOAGULATION CONSULT NOTE - Initial Consult  Pharmacy Consult for Heparin Indication: chest pain/ACS  No Known Allergies  Patient Measurements:   Heparin Dosing Weight:57.5kg  Vital Signs: Temp: 97.6 F (36.4 C) (11/01 0528) Temp Source: Oral (11/01 0528) BP: 96/61 (11/01 1033) Pulse Rate: 92 (11/01 0528)  Labs: Recent Labs    03/11/18 1843 03/13/18 0515 03/13/18 0840  HGB 10.3*  --   --   HCT 32.8*  --   --   PLT 405*  --   --   CREATININE 1.11 1.04  --   TROPONINI  --   --  0.25*    Estimated Creatinine Clearance: 46.8 mL/min (by C-G formula based on SCr of 1.04 mg/dL).   Medical History: Past Medical History:  Diagnosis Date  . Coronary artery disease 2007   s/p CABG in 2007 for severe left main disease  . Hypercholesterolemia   . Hyperlipidemia   . Lymphoma ([place]) 2003   without recurrence  . Myocardial infarction ([place]) 2007  . Normal nuclear stress test March 2012   EF 55%. No ischemia. Mild inferior scar  . Skin cancer of face     Medications:  No anticoagulants PTA  Assessment: 82 yo M with hx CAD s/p CABG admitted with dyspnea currently on antibiotics for PNA now with elevated troponin.  Pharmacy consulted to start IV heparin. No bleeding noted. CBC reviewed- Hg low but stable, pltc WNL.   Currently on Lovenox for VTE px- last received 40mg  dose at 2142 on 10/31.  Goal of Therapy:  Heparin level 0.3-0.7 units/ml Monitor platelets by anticoagulation protocol: Yes   Plan:  Give 3000 units bolus x 1 Start heparin infusion at 700 units/hr Check anti-Xa level in 8 hours and daily while on heparin Continue to monitor H&H and platelets  [place]ana Kewley, Lavonia Drafts 03/13/2018,11:15 AM

## 2018-03-13 NOTE — Progress Notes (Signed)
PROGRESS NOTE    WINDSOR GOEKEN  MVE:[id number] DOB: 1935-12-12 DOA: 03/11/2018 PCP: Chipper Herb, MD   Brief Narrative:  82 year old with past medical history relevant for coronary artery disease status post CABG, BPH, non-Hodgkin's lymphoma with primarily bulky retroperitoneal disease status post 3 cycles of CHOP/rituximab who presents with dyspnea on exertion and found to be hypoxic with possible pneumonia on CT scan.   Assessment & Plan:   Principal Problem:   Dyspnea on exertion Active Problems:   Community acquired pneumonia of right upper lobe of lung (Echo)   Essential hypertension  #) Elevated troponin/worsening EF: Patient does report exertional dyspnea concerning for possible coronary artery disease particular with his prior history.  His troponin is elevated today.  His EKG does not show any acute ST segment changes.  Suspect patient had an NSTEMI as an outpatient that causes his dyspnea.  It is unclear how much his "and "pneumonia is the cause of his symptoms. -Echo on 03/12/2018 shows EF of 45 to 28% grade 1 diastolic dysfunction -Start heparin drip -Continue aspirin 81 mg -Continue metoprolol tartrate 25 mg twice daily -We will consider starting statin -Cardiology consult -continue cycling troponins  #) Possible pneumonia: CT scan on admission showed pneumonia versus atypical and inflammation.  Low suspicion for heart failure at this time. -Continue IV cefepime 03/12/2018 -Discontinue vancomycin - Calcitonin quite low -Respiratory virus panel and flu panel  -blood cultures ordered 03/12/2018 no growth to date  #) Coronary artery disease status post CABG: This was in 2007 for left main disease.   -continue aspirin 81 mg -Continue metoprolol tartrate 20 mg twice daily excellent-echo per above  #) BPH: -Continue tamsulosin 0.4 mg daily  Fluids: Tolerating p.o. Electrodes: Monitor and supplement Nutrition: Regular diet  Prophylaxis:  Enoxaparin  Disposition: Pending resolution of hypoxia and oral antibiotics  Full code   Consultants:   Oncology  Procedures:  Echo 03/12/2018  - Left ventricle: Diffuse hypokinesis worse in the inferior wall.   The cavity size was moderately dilated. Wall thickness was   normal. Systolic function was mildly reduced. The estimated   ejection fraction was in the range of 45% to 50%. Doppler   parameters are consistent with abnormal left ventricular   relaxation (grade 1 diastolic dysfunction). - Aortic valve: There was mild regurgitation. - Mitral valve: There was mild regurgitation.  - Atrial septum: No defect or patent foramen ovale was identified.  Antimicrobials:   IV cefepime and vancomycin started 03/12/2018   Subjective: Patient reports he is feeling better.  He is eager to go home.  He denies any nausea, vomiting, chest pain, diarrhea.  His exertional dyspnea has resolved.  Objective: Vitals:   03/12/18 1430 03/12/18 2040 03/13/18 0528 03/13/18 1033  BP: 107/65 116/78 96/63 96/61   Pulse: 89 78 92   Resp: 20 (!) 22 20   Temp: (!) 97.3 F (36.3 C) 97.7 F (36.5 C) 97.6 F (36.4 C)   TempSrc: Oral Oral Oral   SpO2: 100% 91% 98%     Intake/Output Summary (Last 24 hours) at 03/13/2018 1203 Last data filed at 03/13/2018 1015 Gross per 24 hour  Intake 274.56 ml  Output 950 ml  Net -675.44 ml   There were no vitals filed for this visit.  Examination:  General exam: Appears calm and comfortable  Respiratory system: Clear to auscultation.  Scattered rhonchi worse on right Cardiovascular system: Regular rate and rhythm, no murmurs Gastrointestinal system: Soft, nondistended, nontender, plus bowel sounds Central nervous system:  Alert and oriented.  Grossly intact, moving all extremities Extremities: No lower external edema Skin: No rashes over visible skin Psychiatry: Judgement and insight appear normal. Mood & affect appropriate.     Data Reviewed: I  have personally reviewed following labs and imaging studies  CBC: Recent Labs  Lab 03/11/18 1843  WBC 16.6*  HGB 10.3*  HCT 32.8*  MCV 87.9  PLT 476*   Basic Metabolic Panel: Recent Labs  Lab 03/11/18 1843 03/13/18 0515  NA 137  --   K 4.2  --   CL 106  --   CO2 21*  --   GLUCOSE 116*  --   BUN 29*  --   CREATININE 1.11 1.04  CALCIUM 8.6*  --    GFR: Estimated Creatinine Clearance: 46.8 mL/min (by C-G formula based on SCr of 1.04 mg/dL). Liver Function Tests: Recent Labs  Lab 03/11/18 1843  AST 37  ALT 21  ALKPHOS 339*  BILITOT 0.5  PROT 6.3*  ALBUMIN 3.0*   No results for input(s): LIPASE, AMYLASE in the last 168 hours. No results for input(s): AMMONIA in the last 168 hours. Coagulation Profile: No results for input(s): INR, PROTIME in the last 168 hours. Cardiac Enzymes: Recent Labs  Lab 03/13/18 0840  TROPONINI 0.25*   BNP (last 3 results) No results for input(s): PROBNP in the last 8760 hours. HbA1C: No results for input(s): HGBA1C in the last 72 hours. CBG: No results for input(s): GLUCAP in the last 168 hours. Lipid Profile: No results for input(s): CHOL, HDL, LDLCALC, TRIG, CHOLHDL, LDLDIRECT in the last 72 hours. Thyroid Function Tests: No results for input(s): TSH, T4TOTAL, FREET4, T3FREE, THYROIDAB in the last 72 hours. Anemia Panel: No results for input(s): VITAMINB12, FOLATE, FERRITIN, TIBC, IRON, RETICCTPCT in the last 72 hours. Sepsis Labs: Recent Labs  Lab 03/12/18 1412  PROCALCITON 0.10    Recent Results (from the past 240 hour(s))  Urine Culture     Status: None (Preliminary result)   Collection Time: 03/11/18  5:25 PM  Result Value Ref Range Status   Specimen Description   Final    URINE, CLEAN CATCH Performed at [facility] [address], [place], [place] [address]    Special Requests   Final    NONE Performed at [facility], [place] [address], [place], [place] [address]     Culture   Final    CULTURE REINCUBATED FOR BETTER GROWTH Performed at [facility], [place] [address] [place],  [address]    Report Status PENDING  Incomplete  Respiratory Panel by PCR     Status: None   Collection Time: 03/12/18  1:00 PM  Result Value Ref Range Status   Adenovirus NOT DETECTED NOT DETECTED Final   Coronavirus 229E NOT DETECTED NOT DETECTED Final   Coronavirus HKU1 NOT DETECTED NOT DETECTED Final   Coronavirus NL63 NOT DETECTED NOT DETECTED Final   Coronavirus OC43 NOT DETECTED NOT DETECTED Final   Metapneumovirus NOT DETECTED NOT DETECTED Final   Rhinovirus / Enterovirus NOT DETECTED NOT DETECTED Final   Influenza A NOT DETECTED NOT DETECTED Final   Influenza B NOT DETECTED NOT DETECTED Final   Parainfluenza Virus 1 NOT DETECTED NOT DETECTED Final   Parainfluenza Virus 2 NOT DETECTED NOT DETECTED Final   Parainfluenza Virus 3 NOT DETECTED NOT DETECTED Final   Parainfluenza Virus 4 NOT DETECTED NOT DETECTED Final   Respiratory Syncytial Virus NOT DETECTED NOT DETECTED Final   Bordetella  pertussis NOT DETECTED NOT DETECTED Final   Chlamydophila pneumoniae NOT DETECTED NOT DETECTED Final   Mycoplasma pneumoniae NOT DETECTED NOT DETECTED Final    Comment: Performed at [facility], [place] [address], [place], [place] [address]  MRSA PCR Screening     Status: None   Collection Time: 03/12/18  1:00 PM  Result Value Ref Range Status   MRSA by PCR NEGATIVE NEGATIVE Final    Comment:        The GeneXpert MRSA Assay (FDA approved for NASAL specimens only), is one component of a comprehensive MRSA colonization surveillance program. It is not intended to diagnose MRSA infection nor to guide or monitor treatment for MRSA infections. Performed at [facility], [place] [address], [place], [place] [address]   Culture, blood (routine x 2)     Status: None (Preliminary result)   Collection Time: 03/12/18  2:12 PM  Result Value Ref Range Status    Specimen Description   Final    BLOOD LEFT ARM Performed at [facility] [address], [place], [place] [address]    Special Requests   Final    BOTTLES DRAWN AEROBIC AND ANAEROBIC Blood Culture adequate volume Performed at [facility] [address] [place], [place] [address]    Culture   Final    NO GROWTH < 24 HOURS Performed at [facility] [address], [place], [place] [address]    Report Status PENDING  Incomplete  Culture, blood (routine x 2)     Status: None (Preliminary result)   Collection Time: 03/12/18  2:12 PM  Result Value Ref Range Status   Specimen Description   Final    BLOOD RIGHT ARM Performed at [facility] [address], [place], [place] [address]    Special Requests   Final    BOTTLES DRAWN AEROBIC AND ANAEROBIC Blood Culture adequate volume Performed at [facility] [address], [place], [place] [address]    Culture   Final    NO GROWTH < 24 HOURS Performed at [facility] [address] [place],  [address]    Report Status PENDING  Incomplete         Radiology Studies: Dg Chest 2 View  Result Date: 03/11/2018 CLINICAL DATA:  Dyspnea on exertion, shortness of breath. History of lymphoma. EXAM: CHEST - 2 VIEW COMPARISON:  CT chest March 05, 2018 FINDINGS: Cardiomediastinal silhouette is normal. Status post median sternotomy. Mildly calcified aortic arch. No pleural effusions or focal consolidations. Mild chronic interstitial changes. Trachea projects midline and there is no pneumothorax. Soft tissue planes and included osseous structures are non-suspicious. Single-lumen RIGHT chest Port-A-Cath in situ. IMPRESSION: 1. Mild chronic interstitial changes without focal consolidation. 2.  Aortic Atherosclerosis (ICD10-I70.0). Electronically Signed   By: Elon Alas M.D.   On: 03/11/2018 22:42        Scheduled Meds: . aspirin EC  81  mg Oral Daily  . heparin  3,000 Units Intravenous Once  . lactose free nutrition  237 mL Oral TID WC  . metoprolol tartrate  25 mg Oral BID  . sodium chloride flush  10-40 mL Intracatheter Q12H  . tamsulosin  0.4 mg Oral QHS   Continuous Infusions: . ceFEPime (MAXIPIME) IV 1 g (03/13/18 0057)  . heparin       LOS: 2 days    Time spent: [place], MD [facility]  If 7PM-7AM, please contact night-coverage [email or web address] Password [email or web address] 03/13/2018, 12:03 PM

## 2018-03-13 NOTE — Progress Notes (Signed)
Initial Nutrition Assessment  DOCUMENTATION CODES:   Severe malnutrition in context of chronic illness  INTERVENTION:    Boost Plus chocolate TID- Each supplement provides 360kcal and 14g protein.   Liberalize Diet to Regular   NUTRITION DIAGNOSIS:   Severe Malnutrition related to chronic illness, cancer and cancer related treatments as evidenced by energy intake < or equal to 75% for > or equal to 1 month, percent weight loss, moderate fat depletion, severe muscle depletion.  GOAL:   Patient will meet greater than or equal to 90% of their needs  MONITOR:   Supplement acceptance, PO intake, Weight trends, Labs, I & O's  REASON FOR ASSESSMENT:   Malnutrition Screening Tool   ASSESSMENT:   Patient with PMH significant for non-hodgkin lymphoma (on chemotherapy), HLD, MI, and CAD s/p CABG. Presents this admission with dyspnea on exertion and RUL PNA.    Pt endorses having a loss in appetite over the last 2-3 weeks likely related to chemotherapy. States he receives treatment every three weeks and his appetite decreases weeks 1-2 and starts to pick up the third week. During this two week time period PTA pt would consume 1-1.5 meals daily that consisted of cereal, oatmeal, and Boost. Pt does not eat much meat. Pt reports he has issues swallowing some pills but denies issues with foods. He notices some taste changes but they are inconsistent. Discussed the importance of protein intake for preservation of lean body mass moving forward with chemotherapy. Encouraged pt to eat small frequent meals throughout the day and to increase Boost to 2-3 bottles daily.   Meal completions charted as 50-100% for pt's last four meals. Will liberalize diet to maximize calories and protein as pt is malnourished.   Pt reports a UBW of 170 lb and a recent wt loss of 30 lb. Records indicate pt weighed 165 lb on 06/17/17 and 127 lb this admission (23% wt loss in 9 months, significant for time frame).  Nutrition-Focused physical exam completed.   Medications reviewed.  Labs reviewed: corrected calcium 9.4 (wdl)  NUTRITION - FOCUSED PHYSICAL EXAM:    Most Recent Value  Orbital Region  Moderate depletion  Upper Arm Region  Severe depletion  Thoracic and Lumbar Region  Unable to assess  Buccal Region  Severe depletion  Temple Region  Severe depletion  Clavicle Bone Region  Severe depletion  Clavicle and Acromion Bone Region  Severe depletion  Scapular Bone Region  Unable to assess  Dorsal Hand  Moderate depletion  Patellar Region  Severe depletion  Anterior Thigh Region  Severe depletion  Posterior Calf Region  Severe depletion  Edema (RD Assessment)  None     Diet Order:   Diet Order            Diet Heart Room service appropriate? Yes; Fluid consistency: Thin  Diet effective now              EDUCATION NEEDS:   Education needs have been addressed  Skin:  Skin Assessment: Reviewed RN Assessment  Last BM:  03/11/18  Height:   Ht Readings from Last 1 Encounters:  03/11/18 5\' 6"  (1.676 m)    Weight:   Wt Readings from Last 1 Encounters:  03/11/18 57.5 kg    Ideal Body Weight:  64.5 kg  BMI:  There is no height or weight on file to calculate BMI.  Estimated Nutritional Needs:   Kcal:  1750-1950 kcal  Protein:  85-100 grams   Fluid:  >/= 1.7 L/day  Mariana Single RD, LDN Clinical Nutrition Pager # [phone]

## 2018-03-13 NOTE — Consult Note (Addendum)
Cardiology Consultation:   Patient ID: David Little MRN: [id number]; DOB: 1935-08-23  Admit date: 03/11/2018 Date of Consult: 03/13/2018  Primary Care Provider: Chipper Herb, MD Primary Cardiologist: Minus Breeding, MD  Primary Electrophysiologist:  None   Patient Profile:   David Little is a 82 y.o. male with a hx of CAD s/p CABG 2007, HTN, HLD and non-Hodgkin's lymphoma who is being seen today for the evaluation of elevated troponin at the request of Dr. Herbert Moors.  History of Present Illness:   David Little was admitted to [facility] hospital on 03/11/2018 with complaints of dyspnea on exertion that began about 6 weeks ago and progressively worsened.  Flu test was negative.  CT angios of the chest on 03/05/2018 was significant for right upper lobe pneumonia and no evidence of PE.  He was started on doxycycline and later Levaquin was added.  Chest x-ray at presentation on 03/11/2018 showed mild chronic interstitial changes without focal consolidation and aortic atherosclerosis.  He was admitted for treatment of left upper lobe pneumonia  He was last seen in our office on 01/13/2018 and was doing well from a cardiac standpoint. He had Low risk nuclear stress test in 06/2017.  Echocardiogram 12/31/2017 for baseline assessment prior to starting chemotherapy for recurrent lymphoma showed a normal LV EF at 50-55% with grade 1 diastolic dysfunction and mild diffuse hypokinesis.  Global longitudinal strain was measured at -13.2 (abnormal).   David Little is a pleasant, happy gentleman.  He tells me that he presented with profound weakness and shortness of breath on 03/11/2018.  He did have some occasional mild chest pressure with taking a deep breath that he associated with the pneumonia.  He says that he has not had any chest discomfort for several days now.  His weakness and dyspnea on exertion have gotten progressively better and he feels near baseline today.  No chest discomfort or  shortness of breath today.  His main concern this morning was low blood pressure, systolic blood pressure in the 90s, but this is better now.  He denied any lightheadedness or dizziness.   Past Medical History:  Diagnosis Date  . Coronary artery disease 2007   s/p CABG in 2007 for severe left main disease  . Hypercholesterolemia   . Hyperlipidemia   . Lymphoma ([place]) 2003   without recurrence  . Myocardial infarction ([place]) 2007  . Normal nuclear stress test March 2012   EF 55%. No ischemia. Mild inferior scar  . Skin cancer of face     Past Surgical History:  Procedure Laterality Date  . CORONARY ARTERY BYPASS GRAFT  2007   Had coronary artery bypass grafting for severe left main coronary artery stenosis--Had follow up stress cardiolite study in February 2010 which showed EF of 55% with mild inferior hypokinesia--There was no ischemia  . HERNIA REPAIR    . INGUINAL HERNIA REPAIR Left 05/31/2016   Procedure: LEFT INGUINAL HERNIA REPAIR WITH MESH;  Surgeon: Jackolyn Confer, MD;  Location: [facility];  Service: General;  Laterality: Left;  . INSERTION OF MESH Left 05/31/2016   Procedure: INSERTION OF MESH;  Surgeon: Jackolyn Confer, MD;  Location: [facility];  Service: General;  Laterality: Left;  . IR IMAGING GUIDED PORT INSERTION  01/05/2018  . TOTAL KNEE ARTHROPLASTY Left 09/29/2013   Procedure: TOTAL KNEE ARTHROPLASTY;  Surgeon: Alta Corning, MD;  Location: [facility];  Service: Orthopedics;  Laterality: Left;     Home Medications:  Prior to Admission medications  Medication Sig Start Date End Date Taking? Authorizing Provider  acetaminophen (TYLENOL) 500 MG tablet Take 1,000 mg by mouth every 6 (six) hours as needed for mild pain or moderate pain.   Yes [provider]  doxycycline (VIBRA-TABS) 100 MG tablet Take 100 mg by mouth 2 (two) times daily. Finishes on 03/11/18   Yes [provider]  levofloxacin (LEVAQUIN) 750 MG tablet Take 1 tablet (750 mg total) by mouth daily.  03/09/18  Yes Ladell Pier, MD  metoprolol tartrate (LOPRESSOR) 25 MG tablet Take 1 tablet (25 mg total) by mouth 2 (two) times daily. 05/07/17  Yes Minus Breeding, MD  nitroGLYCERIN (NITROSTAT) 0.4 MG SL tablet Place 1 tablet (0.4 mg total) under the tongue as needed. 05/01/17  Yes Barrett, Evelene Croon, PA-C  ondansetron (ZOFRAN-ODT) 8 MG disintegrating tablet Take 1 tablet (8 mg total) by mouth every 8 (eight) hours as needed for nausea or vomiting. 01/08/18  Yes Owens Shark, NP  Polyethyl Glycol-Propyl Glycol (SYSTANE OP) Apply 1 drop to eye daily as needed (dry eyes).   Yes [provider]  prochlorperazine (COMPAZINE) 10 MG tablet Take 1 tablet (10 mg total) by mouth every 6 (six) hours as needed for nausea or vomiting. 12/26/17  Yes Ladell Pier, MD  tamsulosin (FLOMAX) 0.4 MG CAPS capsule Take 0.4 mg by mouth at bedtime. 01/21/18  Yes [provider]  traMADol (ULTRAM) 50 MG tablet Take 1 tablet (50 mg total) by mouth every 6 (six) hours as needed. 02/20/18  Yes Ladell Pier, MD  aspirin EC 81 MG tablet Take 1 tablet (81 mg total) by mouth daily. Patient not taking: Reported on 03/11/2018 06/17/17   Almyra Deforest, PA    Inpatient Medications: Scheduled Meds: . aspirin EC  81 mg Oral Daily  . enoxaparin (LOVENOX) injection  40 mg Subcutaneous Q24H  . metoprolol tartrate  25 mg Oral BID  . sodium chloride flush  10-40 mL Intracatheter Q12H  . tamsulosin  0.4 mg Oral QHS   Continuous Infusions: . ceFEPime (MAXIPIME) IV 1 g (03/13/18 0057)   PRN Meds: ondansetron, sodium chloride flush, sodium chloride flush, traMADol  Allergies:   No Known Allergies  Social History:   Social History   Socioeconomic History  . Marital status: Married    Spouse name: Not on file  . Number of children: 3  . Years of education: 26  . Highest education level: Some college, no degree  Occupational History  . Occupation: Retired    Comment: Theatre manager    Social Needs  . Financial resource strain: Not very hard  . Food insecurity:    Worry: Never true    Inability: Never true  . Transportation needs:    Medical: No    Non-medical: No  Tobacco Use  . Smoking status: Former Smoker    Packs/day: 1.00    Years: 30.00    Pack years: 30.00    Last attempt to quit: 09/22/1973    Years since quitting: 44.5  . Smokeless tobacco: Former Systems developer    Types: Chew  Substance and Sexual Activity  . Alcohol use: No  . Drug use: No  . Sexual activity: Not Currently  Lifestyle  . Physical activity:    Days per week: 0 days    Minutes per session: 0 min  . Stress: To some extent  Relationships  . Social connections:    Talks on phone: More than three times a week  Gets together: More than three times a week    Attends religious service: More than 4 times per year    Active member of club or organization: No    Attends meetings of clubs or organizations: Never    Relationship status: Married  . Intimate partner violence:    Fear of current or ex partner: No    Emotionally abused: No    Physically abused: No    Forced sexual activity: No  Other Topics Concern  . Not on file  Social History Narrative   Pt lives with wife in [place].     Family History:    Family History  Problem Relation Age of Onset  . Heart attack Brother 35       possible, not confirmed  . Unexplained death Mother 28  . Cancer Father 83     ROS:  Please see the history of present illness.   All other ROS reviewed and negative.     Physical Exam/Data:   Vitals:   03/12/18 1430 03/12/18 2040 03/13/18 0528 03/13/18 1033  BP: 107/65 116/78 96/63 96/61   Pulse: 89 78 92   Resp: 20 (!) 22 20   Temp: (!) 97.3 F (36.3 C) 97.7 F (36.5 C) 97.6 F (36.4 C)   TempSrc: Oral Oral Oral   SpO2: 100% 91% 98%     Intake/Output Summary (Last 24 hours) at 03/13/2018 1046 Last data filed at 03/13/2018 1015 Gross per 24 hour  Intake 394.56 ml  Output 950 ml  Net  -555.44 ml   There were no vitals filed for this visit. There is no height or weight on file to calculate BMI.  General: Thin, frail elderly male, in no acute distress HEENT: normal Lymph: no adenopathy Neck: no JVD Endocrine:  No thryomegaly Vascular: No carotid bruits; FA pulses 2+ bilaterally without bruits  Cardiac:  normal S1, S2; RRR; no murmur  Lungs:  clear to auscultation bilaterally, no wheezing, rhonchi or rales  Abd: soft, nontender, no hepatomegaly  Ext: no edema Musculoskeletal:  No deformities, BUE and BLE strength normal and equal Skin: warm and dry  Neuro:  CNs 2-12 intact, no focal abnormalities noted Psych:  Normal affect   EKG:  The EKG was personally reviewed and demonstrates: Sinus rhythm at 88 bpm Telemetry: Not on telemetry  Relevant CV Studies:  Echocardiogram 03/12/2018 Study Conclusions - Left ventricle: Diffuse hypokinesis worse in the inferior wall.   The cavity size was moderately dilated. Wall thickness was   normal. Systolic function was mildly reduced. The estimated   ejection fraction was in the range of 45% to 50%. Doppler   parameters are consistent with abnormal left ventricular   relaxation (grade 1 diastolic dysfunction). - Aortic valve: There was mild regurgitation. - Mitral valve: There was mild regurgitation. - Atrial septum: No defect or patent foramen ovale was identified.   NST 06/26/17   Nuclear stress EF: 48%.  The left ventricular ejection fraction is mildly decreased (45-54%).  There was no ST segment deviation noted during stress.  The study is normal.  This is a low risk study.  Normal pharmacologic nuclear stress test with no evidence for prior infarct or ischemia.  LVEF calculated at 48% but visually appears better.    2D Echo 12/31/17 Study Conclusions - Left ventricle: The cavity size was normal. Systolic function was normal. The estimated ejection fraction was in the range of 50% to 55%. Mild diffuse  hypokinesis. Doppler parameters are consistent with abnormal left  ventricular relaxation (grade 1 diastolic dysfunction). Doppler parameters are consistent with indeterminate ventricular filling pressure. - Aortic valve: Transvalvular velocity was within the normal range. There was no stenosis. There was mild regurgitation. - Mitral valve: Transvalvular velocity was within the normal range. There was no evidence for stenosis. There was mild regurgitation. - Left atrium: The atrium was mildly dilated. - Right ventricle: The cavity size was normal. Wall thickness was normal. Systolic function was normal. - Tricuspid valve: There was trivial regurgitation. - Global longitudinal strain -13.2% (abnormal).   Laboratory Data:  Chemistry Recent Labs  Lab 03/11/18 1843 03/13/18 0515  NA 137  --   K 4.2  --   CL 106  --   CO2 21*  --   GLUCOSE 116*  --   BUN 29*  --   CREATININE 1.11 1.04  CALCIUM 8.6*  --   GFRNONAA >60 >60  GFRAA >60 >60  ANIONGAP 10  --     Recent Labs  Lab 03/11/18 1843  PROT 6.3*  ALBUMIN 3.0*  AST 37  ALT 21  ALKPHOS 339*  BILITOT 0.5   Hematology Recent Labs  Lab 03/11/18 1843  WBC 16.6*  RBC 3.73*  HGB 10.3*  HCT 32.8*  MCV 87.9  MCH 27.6  MCHC 31.4  RDW 18.9*  PLT 405*   Cardiac Enzymes Recent Labs  Lab 03/13/18 0840  TROPONINI 0.25*   No results for input(s): TROPIPOC in the last 168 hours.  BNP Recent Labs  Lab 03/11/18 1843  BNP 147.5*    DDimer No results for input(s): DDIMER in the last 168 hours.  Radiology/Studies:  Dg Chest 2 View  Result Date: 03/11/2018 CLINICAL DATA:  Dyspnea on exertion, shortness of breath. History of lymphoma. EXAM: CHEST - 2 VIEW COMPARISON:  CT chest March 05, 2018 FINDINGS: Cardiomediastinal silhouette is normal. Status post median sternotomy. Mildly calcified aortic arch. No pleural effusions or focal consolidations. Mild chronic interstitial changes. Trachea projects midline  and there is no pneumothorax. Soft tissue planes and included osseous structures are non-suspicious. Single-lumen RIGHT chest Port-A-Cath in situ. IMPRESSION: 1. Mild chronic interstitial changes without focal consolidation. 2.  Aortic Atherosclerosis (ICD10-I70.0). Electronically Signed   By: Elon Alas M.D.   On: 03/11/2018 22:42    Assessment and Plan:   Elevated troponin -Patient admitted with community acquired pneumonia, presenting with weakness, dyspnea on exertion and intermittent chest pressure with deep breathing.  Has been undergoing chemotherapy with immunosuppression. -A troponin was checked today.  The patient admits that he had some mild chest pressure with deep breathing last week but is currently having no chest pain.  His breathing has gotten much better with treatment of pneumonia.  -Troponin done this morning was elevated at 0.25. BNP 147.5.  Other labs are unremarkable -EKG is without any abnormalities, no ischemia -It is unclear what this mildly elevated troponin represents.  Unsure if patient had a cardiac event several days ago with graft closure.  Echo done yesterday shows mildly reduced EF.  His echo prior to chemo showed an abnormal longitudinal strain.  He also may have worsened heart function from the chemo. We will obtain a limited echo with Definity to further evaluate wall motion and longitudinal sttrain, Lexiscan Myoview to evaluate for any high risk findings. -Will watch troponin trend. No need for heparin at this time.   CAD -History of CABG in 2007.  Recent nuclear stress test 06/2017 low risk for ischemia -Medical therapy includes aspirin, bb, no longer on  statin.   Hypertension -On metoprolol 25 mg twice daily -Blood pressure is well controlled, even soft this morning in the 90s  Non-Hodgkin's lymphoma -Patient has been undergoing chemotherapy.  Seen in our office for preliminary echocardiogram before starting therapy.  Echo on 12/31/2017 showed  normal LV EF at 50-55% with grade 1 diastolic dysfunction and mild diffuse hypokinesis.  Global longitudinal strain was measured at -13.2 (abnormal). EF now 45-50%. Will reassess longitudinal strain.   Hyperlipidemia -Was on Zocor 40 mg in the past with fasting lipid panel obtained in Dec 2018 showed cholesterol 134, triglyceride 102, HDL 61 and the LDL 53.  Well controlled.David Little thinks that the oncologist stopped with Simvastatin.     For questions or updates, please contact [place] Please consult [email or web address] for contact info under     Signed, Daune Perch, NP  03/13/2018 10:46 AM   I have personally seen and examined this patient with Daune Perch, NP. I agree with the assessment and plan as outlined above. David Little has history of CAD s/p CABG, HTN and Non Hodgkin's lymphoma. He is admitted with pneumonia. His troponin is mildly elevated. He has no chest pain. EKG is reviewed by me and shows sinus rhythm with no ischemic changes. Echo with mild reduction LVEF.  My exam shows: Thin elderly male, NAD. No JVD. No LE edema. CV: soft systolic murmur. Lungs: clear bilaterally.  Plan: Will proceed with limited echo today with Definity to better define LVEF. Plan nuclear stress test tomorrow to exclude high risk disease. I suspect his elevated troponin is due to demand ischemia in setting of pneumonia with underlying CAD.   Lauree Chandler 03/13/2018 1:13 PM

## 2018-03-13 NOTE — Progress Notes (Signed)
  Echocardiogram 2D Echocardiogram has been performed.  David Little 03/13/2018, 3:41 PM

## 2018-03-14 ENCOUNTER — Ambulatory Visit (HOSPITAL_COMMUNITY)
Admission: RE | Admit: 2018-03-14 | Discharge: 2018-03-14 | Disposition: A | Discharge status: Home / Self Care | Payer: Medicare Other | Source: Ambulatory Visit | Attending: Cardiology | Admitting: Cardiology

## 2018-03-14 DIAGNOSIS — Z951 Presence of aortocoronary bypass graft: Secondary | ICD-10-CM | POA: Insufficient documentation

## 2018-03-14 DIAGNOSIS — I5021 Acute systolic (congestive) heart failure: Secondary | ICD-10-CM

## 2018-03-14 DIAGNOSIS — R7989 Other specified abnormal findings of blood chemistry: Secondary | ICD-10-CM

## 2018-03-14 DIAGNOSIS — J181 Lobar pneumonia, unspecified organism: Secondary | ICD-10-CM

## 2018-03-14 LAB — BASIC METABOLIC PANEL
Anion gap: 8 (ref 5–15)
CO2: 23 mmol/L (ref 22–32)
Calcium: 8.6 mg/dL — ABNORMAL LOW (ref 8.9–10.3)
Chloride: 106 mmol/L (ref 98–111)
Creatinine, Ser: 0.9 mg/dL (ref 0.61–1.24)
GFR calc Af Amer: >60 mL/min (ref >60)
Sodium: 137 mmol/L (ref 135–145)

## 2018-03-14 LAB — URINE CULTURE

## 2018-03-14 LAB — NM MYOCAR MULTI W/SPECT W/WALL MOTION / EF
CHL CUP MPHR: 74 {beats}/min
CSEPPHR: 105 {beats}/min
Percent HR: 141 %
Rest HR: 92 {beats}/min

## 2018-03-14 LAB — CBC
HCT: 32.3 % — ABNORMAL LOW (ref 39.0–52.0)
Hemoglobin: 10.2 g/dL — ABNORMAL LOW (ref 13.0–17.0)
MCH: 28.1 pg (ref 26.0–34.0)
MCHC: 31.6 g/dL (ref 30.0–36.0)
MCV: 89 fL (ref 80.0–100.0)
Platelets: 314 10*3/uL (ref 150–400)
RBC: 3.63 MIL/uL — ABNORMAL LOW (ref 4.22–5.81)
RDW: 18.6 % — ABNORMAL HIGH (ref 11.5–15.5)
WBC: 18.3 10*3/uL — ABNORMAL HIGH (ref 4.0–10.5)
nRBC: 0 % (ref 0.0–0.2)

## 2018-03-14 LAB — BASIC METABOLIC PANEL WITH GFR
BUN: 25 mg/dL — ABNORMAL HIGH (ref 8–23)
GFR calc non Af Amer: >60 mL/min (ref >60)
Glucose, Bld: 131 mg/dL — ABNORMAL HIGH (ref 70–99)
Potassium: 4.1 mmol/L (ref 3.5–5.1)

## 2018-03-14 MED ORDER — REGADENOSON 0.4 MG/5ML IV SOLN: 0.4000 mg | Freq: Once | INTRAVENOUS | Status: DC

## 2018-03-14 MED ORDER — SODIUM CHLORIDE 0.9 % IV SOLN
INTRAVENOUS | Status: DC | PRN
  Administered 2018-03-14: 250 mL via INTRAVENOUS

## 2018-03-14 MED ORDER — TECHNETIUM TC 99M TETROFOSMIN IV KIT
30.0000 | PACK | Freq: Once | INTRAVENOUS | Status: AC | PRN
  Administered 2018-03-14: 30 via INTRAVENOUS

## 2018-03-14 MED ORDER — TECHNETIUM TC 99M TETROFOSMIN IV KIT
10.0000 | PACK | Freq: Once | INTRAVENOUS | Status: AC | PRN
  Administered 2018-03-14: 10 via INTRAVENOUS

## 2018-03-14 MED ORDER — REGADENOSON 0.4 MG/5ML IV SOLN
INTRAVENOUS | Status: AC
  Filled 2018-03-14: qty 5

## 2018-03-14 NOTE — Progress Notes (Signed)
PROGRESS NOTE    David Little  UYQ:[id number] DOB: 07-30-1935 DOA: 03/11/2018 PCP: Chipper Herb, MD   Brief Narrative:  82 year old with past medical history relevant for coronary artery disease status post CABG, BPH, non-Hodgkin's lymphoma with primarily bulky retroperitoneal disease status post 3 cycles of CHOP/rituximab who presents with dyspnea on exertion and found to be hypoxic with possible pneumonia on CT scan.   Assessment & Plan:   Principal Problem:   Dyspnea on exertion Active Problems:   Community acquired pneumonia of right upper lobe of lung ([place])   Essential hypertension  #) Elevated troponin/worsening EF: Patient did report exertional shortness of breath but no clear chest pain.  He did have some pressure but not a clear anginal equivalent.  He did have evidence of mildly decreased EF from some wall motion abnormalities.  His troponin has been elevated but flat. -Echo on 03/12/2018 shows EF of 45 to 63% grade 1 diastolic dysfunction -Continue aspirin 81 mg -Continue metoprolol tartrate 25 mg twice daily -Cardiology consult -Plan to perform stress test today.  #) Possible pneumonia: CT scan on admission showed pneumonia versus atypical and inflammation.   -Continue IV cefepime 03/12/2018 - Calcitonin quite low -Respiratory virus panel and flu panel negative -blood cultures ordered 03/12/2018 no growth to date  #) Coronary artery disease status post CABG: This was in 2007 for left main disease.   -continue aspirin 81 mg -Continue metoprolol tartrate 20 mg twice daily  -echo per above -Evaluation per above  #) BPH: -Continue tamsulosin 0.4 mg daily  Fluids: Tolerating p.o. Electrodes: Monitor and supplement Nutrition: Regular diet  Prophylaxis: Enoxaparin  Disposition: Pending resolution of hypoxia and oral antibiotics  Full code   Consultants:   Oncology  Cardiology  Procedures:  Echo 03/12/2018  - Left ventricle: Diffuse  hypokinesis worse in the inferior wall.   The cavity size was moderately dilated. Wall thickness was   normal. Systolic function was mildly reduced. The estimated   ejection fraction was in the range of 45% to 50%. Doppler   parameters are consistent with abnormal left ventricular   relaxation (grade 1 diastolic dysfunction). - Aortic valve: There was mild regurgitation. - Mitral valve: There was mild regurgitation.  - Atrial septum: No defect or patent foramen ovale was identified.  Antimicrobials:   IV cefepime and vancomycin started 03/12/2018   Subjective: Patient reports he is feeling better.  He denies any nausea, vomiting, diarrhea, chest pain.  He has not had any lower extremity edema orthopnea.  Objective: Vitals:   03/13/18 1500 03/13/18 2103 03/13/18 2135 03/14/18 0453  BP:  117/70  97/63  Pulse: 100 (!) 111 100 90  Resp: (!) 22 18  20   Temp:  98.3 F (36.8 C)  98 F (36.7 C)  TempSrc:  Oral  Oral  SpO2:  100%  99%    Intake/Output Summary (Last 24 hours) at 03/14/2018 1019 Last data filed at 03/14/2018 0711 Gross per 24 hour  Intake 320 ml  Output 650 ml  Net -330 ml   There were no vitals filed for this visit.  Examination:  General exam: Appears calm and comfortable  Respiratory system: Clear to auscultation.  Scattered rhonchi worse on right Cardiovascular system: Regular rate and rhythm, no murmurs Gastrointestinal system: Soft, nondistended, nontender, plus bowel sounds Central nervous system: Alert and oriented.  Grossly intact, moving all extremities Extremities: No lower external edema Skin: Well-healed incision over chest Psychiatry: Judgement and insight appear normal. Mood & affect appropriate.  Data Reviewed: I have personally reviewed following labs and imaging studies  CBC: Recent Labs  Lab 03/11/18 1843 03/14/18 0450  WBC 16.6* 18.3*  HGB 10.3* 10.2*  HCT 32.8* 32.3*  MCV 87.9 89.0  PLT 405* 993   Basic Metabolic  Panel: Recent Labs  Lab 03/11/18 1843 03/13/18 0515 03/14/18 0450  NA 137  --  137  K 4.2  --  4.1  CL 106  --  106  CO2 21*  --  23  GLUCOSE 116*  --  131*  BUN 29*  --  25*  CREATININE 1.11 1.04 0.90  CALCIUM 8.6*  --  8.6*   GFR: Estimated Creatinine Clearance: 54.1 mL/min (by C-G formula based on SCr of 0.9 mg/dL). Liver Function Tests: Recent Labs  Lab 03/11/18 1843  AST 37  ALT 21  ALKPHOS 339*  BILITOT 0.5  PROT 6.3*  ALBUMIN 3.0*   No results for input(s): LIPASE, AMYLASE in the last 168 hours. No results for input(s): AMMONIA in the last 168 hours. Coagulation Profile: No results for input(s): INR, PROTIME in the last 168 hours. Cardiac Enzymes: Recent Labs  Lab 03/13/18 0840 03/13/18 1345 03/13/18 2042  TROPONINI 0.25* 0.23* 0.27*   BNP (last 3 results) No results for input(s): PROBNP in the last 8760 hours. HbA1C: No results for input(s): HGBA1C in the last 72 hours. CBG: No results for input(s): GLUCAP in the last 168 hours. Lipid Profile: No results for input(s): CHOL, HDL, LDLCALC, TRIG, CHOLHDL, LDLDIRECT in the last 72 hours. Thyroid Function Tests: No results for input(s): TSH, T4TOTAL, FREET4, T3FREE, THYROIDAB in the last 72 hours. Anemia Panel: No results for input(s): VITAMINB12, FOLATE, FERRITIN, TIBC, IRON, RETICCTPCT in the last 72 hours. Sepsis Labs: Recent Labs  Lab 03/12/18 1412  PROCALCITON 0.10    Recent Results (from the past 240 hour(s))  Urine Culture     Status: Abnormal   Collection Time: 03/11/18  5:25 PM  Result Value Ref Range Status   Specimen Description   Final    URINE, CLEAN CATCH Performed at [facility] [address] [place], [place] [address]    Special Requests   Final    NONE Performed at [facility], [place] [address] [place], [place] [address]    Culture >=100,000 COLONIES/mL YEAST (A)  Final   Report Status 03/14/2018 FINAL  Final  Respiratory Panel by  PCR     Status: None   Collection Time: 03/12/18  1:00 PM  Result Value Ref Range Status   Adenovirus NOT DETECTED NOT DETECTED Final   Coronavirus 229E NOT DETECTED NOT DETECTED Final   Coronavirus HKU1 NOT DETECTED NOT DETECTED Final   Coronavirus NL63 NOT DETECTED NOT DETECTED Final   Coronavirus OC43 NOT DETECTED NOT DETECTED Final   Metapneumovirus NOT DETECTED NOT DETECTED Final   Rhinovirus / Enterovirus NOT DETECTED NOT DETECTED Final   Influenza A NOT DETECTED NOT DETECTED Final   Influenza B NOT DETECTED NOT DETECTED Final   Parainfluenza Virus 1 NOT DETECTED NOT DETECTED Final   Parainfluenza Virus 2 NOT DETECTED NOT DETECTED Final   Parainfluenza Virus 3 NOT DETECTED NOT DETECTED Final   Parainfluenza Virus 4 NOT DETECTED NOT DETECTED Final   Respiratory Syncytial Virus NOT DETECTED NOT DETECTED Final   Bordetella pertussis NOT DETECTED NOT DETECTED Final   Chlamydophila pneumoniae NOT DETECTED NOT DETECTED Final   Mycoplasma pneumoniae NOT DETECTED NOT DETECTED Final    Comment: Performed at [facility]  [facility], [place] [address], [place], [place] [address]  MRSA PCR Screening     Status: None   Collection Time: 03/12/18  1:00 PM  Result Value Ref Range Status   MRSA by PCR NEGATIVE NEGATIVE Final    Comment:        The GeneXpert MRSA Assay (FDA approved for NASAL specimens only), is one component of a comprehensive MRSA colonization surveillance program. It is not intended to diagnose MRSA infection nor to guide or monitor treatment for MRSA infections. Performed at [facility], [place] [address], [place], [place] [address]   Culture, blood (routine x 2)     Status: None (Preliminary result)   Collection Time: 03/12/18  2:12 PM  Result Value Ref Range Status   Specimen Description   Final    BLOOD LEFT ARM Performed at [facility] [address], [place], [place] [address]    Special Requests   Final    BOTTLES DRAWN AEROBIC  AND ANAEROBIC Blood Culture adequate volume Performed at [facility] [address], [place], [place] [address]    Culture   Final    NO GROWTH 2 DAYS Performed at [facility] [address] [place], [place] [address]    Report Status PENDING  Incomplete  Culture, blood (routine x 2)     Status: None (Preliminary result)   Collection Time: 03/12/18  2:12 PM  Result Value Ref Range Status   Specimen Description   Final    BLOOD RIGHT ARM Performed at [facility] [address] [place], [place] [address]    Special Requests   Final    BOTTLES DRAWN AEROBIC AND ANAEROBIC Blood Culture adequate volume Performed at [facility] [address], [place], [place] [address]    Culture   Final    NO GROWTH 2 DAYS Performed at [facility] [address] [place], [place] [address]    Report Status PENDING  Incomplete         Radiology Studies: No results found.      Scheduled Meds: . aspirin EC  81 mg Oral Daily  . enoxaparin (LOVENOX) injection  40 mg Subcutaneous Q24H  . lactose free nutrition  237 mL Oral TID WC  . metoprolol tartrate  25 mg Oral BID  . sodium chloride flush  10-40 mL Intracatheter Q12H  . tamsulosin  0.4 mg Oral QHS   Continuous Infusions: . ceFEPime (MAXIPIME) IV Stopped (03/14/18 0944)     LOS: 3 days    Time spent: [place], MD [facility]  If 7PM-7AM, please contact night-coverage [email or web address] Password [facility] 03/14/2018, 10:19 AM

## 2018-03-14 NOTE — Progress Notes (Signed)
   [name] presented for a nuclear stress test today.  No immediate complications.  Stress imaging is pending at this time.  Preliminary EKG findings may be listed in the chart, but the stress test result will not be finalized until perfusion imaging is complete.  Charlie Pitter, PA-C 03/14/2018, 11:51 AM

## 2018-03-14 NOTE — Progress Notes (Signed)
DAILY PROGRESS NOTE   Patient Name: David Little Date of Encounter: 03/14/2018  Chief Complaint   No chest pain overnight  Patient Profile   David Little is a 82 y.o. male with a hx of CAD s/p CABG 2007, HTN, HLD and non-Hodgkin's lymphoma who is being seen today for the evaluation of elevated troponin at the request of Dr. Herbert Moors.  Subjective   No chest pain overnight. Echo shows mild further reduction in LVEF 45-50% with regional WMA's including septal, lateral and inferior hypokinesis. Troponin flat elevated at 0.25, 0.23 and 0.27, which may be simply due to heart failure or PNA, however, BNP low at 147. Plan for myoview stress test today to evaluate for possible graft dysfunction.  Objective   Vitals:   03/13/18 1500 03/13/18 2103 03/13/18 2135 03/14/18 0453  BP:  117/70  97/63  Pulse: 100 (!) 111 100 90  Resp: (!) _0 Temp:  98.3 F (36.8 C)  98 F (36.7 C)  TempSrc:  Oral  Oral  SpO2:  100%  99%    Intake/Output Summary (Last 24 hours) at 03/14/2018 0838 Last data filed at 03/14/2018 0600 Gross per 24 hour  Intake 440 ml  Output 650 ml  Net -210 ml   There were no vitals filed for this visit.  Physical Exam   General appearance: alert and no distress Lungs: diminished breath sounds bilaterally Heart: regular rate and rhythm Extremities: extremities normal, atraumatic, no cyanosis or edema Neurologic: Grossly normal  Inpatient Medications    Scheduled Meds: . aspirin EC  81 mg Oral Daily  . enoxaparin (LOVENOX) injection  40 mg Subcutaneous Q24H  . lactose free nutrition  237 mL Oral TID WC  . metoprolol tartrate  25 mg Oral BID  . sodium chloride flush  10-40 mL Intracatheter Q12H  . tamsulosin  0.4 mg Oral QHS    Continuous Infusions: . ceFEPime (MAXIPIME) IV 1 g (03/14/18 0115)    PRN Meds: ondansetron, sodium chloride flush, sodium chloride flush, traMADol   Labs   Results for orders placed or performed during the  hospital encounter of 03/11/18 (from the past 48 hour(s))  Respiratory Panel by PCR     Status: None   Collection Time: 03/12/18  1:00 PM  Result Value Ref Range   Adenovirus NOT DETECTED NOT DETECTED   Coronavirus 229E NOT DETECTED NOT DETECTED   Coronavirus HKU1 NOT DETECTED NOT DETECTED   Coronavirus NL63 NOT DETECTED NOT DETECTED   Coronavirus OC43 NOT DETECTED NOT DETECTED   Metapneumovirus NOT DETECTED NOT DETECTED   Rhinovirus / Enterovirus NOT DETECTED NOT DETECTED   Influenza A NOT DETECTED NOT DETECTED   Influenza B NOT DETECTED NOT DETECTED   Parainfluenza Virus 1 NOT DETECTED NOT DETECTED   Parainfluenza Virus 2 NOT DETECTED NOT DETECTED   Parainfluenza Virus 3 NOT DETECTED NOT DETECTED   Parainfluenza Virus 4 NOT DETECTED NOT DETECTED   Respiratory Syncytial Virus NOT DETECTED NOT DETECTED   Bordetella pertussis NOT DETECTED NOT DETECTED   Chlamydophila pneumoniae NOT DETECTED NOT DETECTED   Mycoplasma pneumoniae NOT DETECTED NOT DETECTED    Comment: Performed at [facility], [address], [place], [place] [address]  Influenza panel by PCR (type A & B)     Status: None   Collection Time: 03/12/18  1:00 PM  Result Value Ref Range   Influenza A By PCR NEGATIVE NEGATIVE   Influenza B By PCR NEGATIVE NEGATIVE  Comment: (NOTE) The Xpert Xpress Flu assay is intended as an aid in the diagnosis of  influenza and should not be used as a sole basis for treatment.  This  assay is FDA approved for nasopharyngeal swab specimens only. Nasal  washings and aspirates are unacceptable for Xpert Xpress Flu testing. Performed at [facility], [place] [address], [place], [place] [address]   MRSA PCR Screening     Status: None   Collection Time: 03/12/18  1:00 PM  Result Value Ref Range   MRSA by PCR NEGATIVE NEGATIVE    Comment:        The GeneXpert MRSA Assay (FDA approved for NASAL specimens only), is one component of a comprehensive MRSA  colonization surveillance program. It is not intended to diagnose MRSA infection nor to guide or monitor treatment for MRSA infections. Performed at [facility], [place] [address], [place], [place] [address]   Culture, blood (routine x 2)     Status: None (Preliminary result)   Collection Time: 03/12/18  2:12 PM  Result Value Ref Range   Specimen Description      BLOOD LEFT ARM Performed at [facility] [address], [place], [place] [address]    Special Requests      BOTTLES DRAWN AEROBIC AND ANAEROBIC Blood Culture adequate volume Performed at [facility] [address], [place], [place] [address]    Culture      NO GROWTH 2 DAYS Performed at [facility], [place] [address] [place], [place] [address]    Report Status PENDING   Culture, blood (routine x 2)     Status: None (Preliminary result)   Collection Time: 03/12/18  2:12 PM  Result Value Ref Range   Specimen Description      BLOOD RIGHT ARM Performed at [facility] [address], [place], [place] [address]    Special Requests      BOTTLES DRAWN AEROBIC AND ANAEROBIC Blood Culture adequate volume Performed at [facility] [address], [place], [place] [address]    Culture      NO GROWTH 2 DAYS Performed at [facility], [place] [address], [place], [place] [address]    Report Status PENDING   Procalcitonin - Baseline     Status: None   Collection Time: 03/12/18  2:12 PM  Result Value Ref Range   Procalcitonin 0.10 ng/mL    Comment:        Interpretation: PCT (Procalcitonin) <= 0.5 ng/mL: Systemic infection (sepsis) is not likely. Local bacterial infection is possible. (NOTE)       Sepsis PCT Algorithm           Lower Respiratory Tract                                      Infection PCT Algorithm    ----------------------------     ----------------------------         PCT < 0.25 ng/mL                 PCT < 0.10 ng/mL         Strongly encourage             Strongly discourage   discontinuation of antibiotics    initiation of antibiotics    ----------------------------     -----------------------------  PCT 0.25 - 0.50 ng/mL            PCT 0.10 - 0.25 ng/mL               OR       >80% decrease in PCT            Discourage initiation of                                            antibiotics      Encourage discontinuation           of antibiotics    ----------------------------     -----------------------------         PCT >= 0.50 ng/mL              PCT 0.26 - 0.50 ng/mL               AND        <80% decrease in PCT             Encourage initiation of                                             antibiotics       Encourage continuation           of antibiotics    ----------------------------     -----------------------------        PCT >= 0.50 ng/mL                  PCT > 0.50 ng/mL               AND         increase in PCT                  Strongly encourage                                      initiation of antibiotics    Strongly encourage escalation           of antibiotics                                     -----------------------------                                           PCT <= 0.25 ng/mL                                                 OR                                        > 80% decrease in PCT  Discontinue / Do not initiate                                             antibiotics Performed at [facility] [address], [place], [place] [address]   Creatinine, serum     Status: None   Collection Time: 03/13/18  5:15 AM  Result Value Ref Range   Creatinine, Ser 1.04 0.61 - 1.24 mg/dL   GFR calc non Af Amer >60 >60 mL/min   GFR calc Af Amer >60 >60 mL/min    Comment: (NOTE) The eGFR has been calculated using the CKD EPI equation. This calculation has not been validated in all clinical  situations. eGFR's persistently <60 mL/min signify possible Chronic Kidney Disease. Performed at [facility], [place] [address], [place], [place] [address]   Troponin I (q 6hr x 3)     Status: Abnormal   Collection Time: 03/13/18  8:40 AM  Result Value Ref Range   Troponin I 0.25 (HH) <0.03 ng/mL    Comment: CRITICAL RESULT CALLED TO, READ BACK BY AND VERIFIED WITH: BLOCK,D. RN AT 9371 03/13/18 MULLINS,T Performed at [facility], [place] [address], [place], [place] [address]   Troponin I (q 6hr x 3)     Status: Abnormal   Collection Time: 03/13/18  1:45 PM  Result Value Ref Range   Troponin I 0.23 (HH) <0.03 ng/mL    Comment: CRITICAL VALUE NOTED.  VALUE IS CONSISTENT WITH PREVIOUSLY REPORTED AND CALLED VALUE. Performed at [facility], [place] [address] [place], [place] [address]   Troponin I (q 6hr x 3)     Status: Abnormal   Collection Time: 03/13/18  8:42 PM  Result Value Ref Range   Troponin I 0.27 (HH) <0.03 ng/mL    Comment: CRITICAL VALUE NOTED.  VALUE IS CONSISTENT WITH PREVIOUSLY REPORTED AND CALLED VALUE. Performed at [facility], [place] [address] [place], [place] [address]   Basic metabolic panel     Status: Abnormal   Collection Time: 03/14/18  4:50 AM  Result Value Ref Range   Sodium 137 135 - 145 mmol/L   Potassium 4.1 3.5 - 5.1 mmol/L   Chloride 106 98 - 111 mmol/L   CO2 23 22 - 32 mmol/L   Glucose, Bld 131 (H) 70 - 99 mg/dL   BUN 25 (H) 8 - 23 mg/dL   Creatinine, Ser 0.90 0.61 - 1.24 mg/dL   Calcium 8.6 (L) 8.9 - 10.3 mg/dL   GFR calc non Af Amer >60 >60 mL/min   GFR calc Af Amer >60 >60 mL/min    Comment: (NOTE) The eGFR has been calculated using the CKD EPI equation. This calculation has not been validated in all clinical situations. eGFR's persistently <60 mL/min signify possible Chronic Kidney Disease.    Anion gap 8 5 - 15    Comment: Performed at [facility],  [place] [address] [place], [place] [address]  CBC     Status: Abnormal   Collection Time: 03/14/18  4:50 AM  Result Value Ref Range   WBC 18.3 (H) 4.0 - 10.5 K/uL   RBC 3.63 (L) 4.22 - 5.81 MIL/uL   Hemoglobin 10.2 (L) 13.0 - 17.0 g/dL   HCT 32.3 (L) 39.0 - 52.0 %   MCV 89.0 80.0 - 100.0 fL  MCH 28.1 26.0 - 34.0 pg   MCHC 31.6 30.0 - 36.0 g/dL   RDW 18.6 (H) 11.5 - 15.5 %   Platelets 314 150 - 400 K/uL   nRBC 0.0 0.0 - 0.2 %    Comment: Performed at [facility], [place] [address] [place],  [address]    ECG   N/A  Telemetry   N/A  Radiology    No results found.  Cardiac Studies   LV EF: 45% -   50%  ------------------------------------------------------------------- Indications:      Chest pain 786.51.  ------------------------------------------------------------------- Study Conclusions  - Left ventricle: The cavity size was normal. Wall thickness was   normal. Systolic function was mildly reduced. The estimated   ejection fraction was in the range of 45% to 50%. Abnormal strain   in the septal and lateral walls. There was septal hypokinesis,   lateral hypokinesis, and inferior hypokinesis. Possible   multivessel coronary disease. - Aortic valve: There was mild regurgitation. - Mitral valve: There was mild regurgitation. - Right ventricle: The cavity size was normal. Systolic function   was normal.  Impressions:  - Limited echo for LV function.  Assessment   1. Principal Problem: 2.   Dyspnea on exertion 3. Active Problems: 4.   Community acquired pneumonia of right upper lobe of lung ([place]) 5.   Essential hypertension 6.   Plan   1. Plan myoview stress test today given elevated troponin, mildly reduced LVEF and regional wall motion abnormalities.  Time Spent Directly with Patient:  I have spent a total of 25 minutes with the patient reviewing hospital notes, telemetry, EKGs, labs and examining the patient as well as  establishing an assessment and plan that was discussed personally with the patient.  > 50% of time was spent in direct patient care.  Length of Stay:  LOS: 3 days   Pixie Casino, MD, [facility], [place] Director of the Advanced Lipid Disorders &  Cardiovascular Risk Reduction Clinic Diplomate of the American Board of Clinical Lipidology Attending Cardiologist  Direct Dial: [phone]  Fax: [phone]  Website:  [email or web address]  03/14/2018, 8:38 AM

## 2018-03-14 NOTE — Progress Notes (Signed)
Nuc Med 03/14/18  IMPRESSION: 1. Large fixed defect compatible with infarct in the septum and inferior wall. No inducible ischemia is present.  2. Marked hypokinesis within the infarct territory of the septum and inferior wall. Wall motion is otherwise normal.  3. Left ventricular ejection fraction 37%  4. Non invasive risk stratification*: Intermediate   Discussed with Dr. Margaretann Loveless. Although no ischemia, nuc is clearly different than 2017. Per d/w IM, pt is not being discharged today so will have Dr. Debara Pickett round on patient in AM to further review nuc and decide next steps.   Dezmon Conover PA-C

## 2018-03-15 DIAGNOSIS — I2119 ST elevation (STEMI) myocardial infarction involving other coronary artery of inferior wall: Secondary | ICD-10-CM

## 2018-03-15 DIAGNOSIS — I10 Essential (primary) hypertension: Secondary | ICD-10-CM

## 2018-03-15 DIAGNOSIS — E43 Unspecified severe protein-calorie malnutrition: Secondary | ICD-10-CM

## 2018-03-15 MED ORDER — HEPARIN SOD (PORK) LOCK FLUSH 100 UNIT/ML IV SOLN
500.0000 [IU] | INTRAVENOUS | Status: AC | PRN
  Administered 2018-03-15: 500 [IU]

## 2018-03-15 MED ORDER — METOPROLOL SUCCINATE ER 50 MG PO TB24: 50.0000 mg | ORAL_TABLET | Freq: Every day | ORAL | 2 refills | Status: AC

## 2018-03-15 MED ORDER — METOPROLOL SUCCINATE ER 50 MG PO TB24
50.0000 mg | ORAL_TABLET | Freq: Every day | ORAL | Status: DC
  Administered 2018-03-15 – 2018-03-19 (×5): 50 mg via ORAL
  Filled 2018-03-15 (×5): qty 1

## 2018-03-15 NOTE — Care Management Important Message (Signed)
Important Message  Patient Details  Name: David Little MRN: [id number] Date of Birth: [birth year]   Medicare Important Message Given:  Yes    Erenest Rasher, RN 03/15/2018, 1:58 PM

## 2018-03-15 NOTE — Progress Notes (Signed)
PT Cancellation Note  Patient Details Name: David Little MRN: [id number] DOB: Oct 20, 1935   Cancelled Treatment:    Reason Eval/Treat Not Completed: Other (comment); spoke with Wendy,RN regarding pt status;  pt was extremely weak on Thursday when PT eval completed (pt unable to stand or ambulate), recommended SNF at that time given 2 recent falls in addition to current medical issues;  pt refused PT on Friday;   per RN pt is too weak to stand or transfer today, family is unable to provide 24 hour assist;  given these factors continue to recommend SNF--unfortunately PT unable to re-eval pt today d/t current PT caseload;  Kenyon Ana, PT  Pager: [phone] Acute Rehab Dept [facility]): [phone]   03/15/2018  [facility] 03/15/2018, 1:28 PM

## 2018-03-15 NOTE — Discharge Summary (Addendum)
Physician Discharge Summary  JOHNOTHAN BASCOMB LDJ:[id number] DOB: September 01, 1935 DOA: 03/11/2018  PCP: Chipper Herb, MD  Admit date: 03/11/2018 Discharge date: 03/15/2018  Time spent: 45 minutes  Recommendations for Outpatient Follow-up:  -To be discharged home today. -Home health physical therapy will be arranged.  Oxygen assessment will be obtained to see if he qualifies for home oxygen and if so will be arranged. -Advised to follow-up with cardiologist within 1 to 2 weeks after discharge.  Discharge Diagnoses:  Principal Problem:   Dyspnea on exertion Active Problems:   Community acquired pneumonia of right upper lobe of lung ([place])   Essential hypertension   Inferior MI ([place])   Protein-calorie malnutrition, severe   Discharge Condition: Stable and improved  There were no vitals filed for this visit.  History of present illness:  As per Dr. Lonny Prude on 10/30: David Little is a 82 y.o. male with medical history significant of non-hodgkin lymphoma, hyperlipidemia, CAD s/p CABG. Dyspnea on exertion started about 6 weeks ago and has been progressively worsening. He now can only walk about 10 feet before feeling significantly dyspneic. He uses a walker to help with ambulation. He sometimes have some rib pain when he lies on his left side. No orthopnea or PND. No associated chest pain with his symptoms.  Hospital Course:   Non-ST elevated MI/chronic diastolic heart failure -Echo shows ejection fraction of 45 to 50% with grade 1 diastolic dysfunction. -Patient had nuclear medicine stress test on 11/2: That showed a large fixed defect compatible with infarct in the septum and inferior wall with no inducible ischemia.  Marked hypokinesis of the septum and inferior wall with a left ventricular ejection fraction of 37%.  This is deemed an intermediate risk study. -Patient was further evaluated by Dr. Debara Pickett on day of discharge.  He has recommended medical therapy.  Continue  aspirin.  Lopressor will be switched over to Toprol-XL.  BP currently does not support the addition of ACE inhibitor/ARB/ARN I at this point. -Dr. Debara Pickett has further stated that it would be okay to continue chemotherapy in this patient. -Patient is currently chest pain free.  Questionable pneumonia -Despite findings on initial CT scan, patient never had cough, shortness of breath.  He has received 5 days of cefepime. -Believe we can safely discontinue antibiotics at this time. -Blood cultures remain negative to date. -Influenza PCR and respiratory virus panel have both resulted negative.  BPH -Continue Flomax.  Non-Hodgkin's lymphoma -Under the care of Dr. Learta Codding. -Chemotherapy is on hold until performance status improves.  Procedures:  Nuclear medicine stress test on 11/2 as above  Consultations:  Cardiology  Oncology  Discharge Instructions  Discharge Instructions    Diet - low sodium heart healthy   Complete by:  As directed    Increase activity slowly   Complete by:  As directed      Allergies as of 03/15/2018   No Known Allergies     Medication List    STOP taking these medications   doxycycline 100 MG tablet Commonly known as:  VIBRA-TABS   levofloxacin 750 MG tablet Commonly known as:  LEVAQUIN   metoprolol tartrate 25 MG tablet Commonly known as:  LOPRESSOR   nitroGLYCERIN 0.4 MG SL tablet Commonly known as:  NITROSTAT     TAKE these medications   acetaminophen 500 MG tablet Commonly known as:  TYLENOL Take 1,000 mg by mouth every 6 (six) hours as needed for mild pain or moderate pain.   aspirin  EC 81 MG tablet Take 1 tablet (81 mg total) by mouth daily.   metoprolol succinate 50 MG 24 hr tablet Commonly known as:  TOPROL-XL Take 1 tablet (50 mg total) by mouth daily. Take with or immediately following a meal.   ondansetron 8 MG disintegrating tablet Commonly known as:  ZOFRAN-ODT Take 1 tablet (8 mg total) by mouth every 8 (eight) hours as  needed for nausea or vomiting.   prochlorperazine 10 MG tablet Commonly known as:  COMPAZINE Take 1 tablet (10 mg total) by mouth every 6 (six) hours as needed for nausea or vomiting.   SYSTANE OP Apply 1 drop to eye daily as needed (dry eyes).   tamsulosin 0.4 MG Caps capsule Commonly known as:  FLOMAX Take 0.4 mg by mouth at bedtime.   traMADol 50 MG tablet Commonly known as:  ULTRAM Take 1 tablet (50 mg total) by mouth every 6 (six) hours as needed.      No Known Allergies Follow-up Information    Minus Breeding, MD. Schedule an appointment as soon as possible for a visit in 2 week(s).   Specialty:  Cardiology Contact information: [address] [place] [address] [phone]            The results of significant diagnostics from this hospitalization (including imaging, microbiology, ancillary and laboratory) are listed below for reference.    Significant Diagnostic Studies: Dg Chest 2 View  Result Date: 03/11/2018 CLINICAL DATA:  Dyspnea on exertion, shortness of breath. History of lymphoma. EXAM: CHEST - 2 VIEW COMPARISON:  CT chest March 05, 2018 FINDINGS: Cardiomediastinal silhouette is normal. Status post median sternotomy. Mildly calcified aortic arch. No pleural effusions or focal consolidations. Mild chronic interstitial changes. Trachea projects midline and there is no pneumothorax. Soft tissue planes and included osseous structures are non-suspicious. Single-lumen RIGHT chest Port-A-Cath in situ. IMPRESSION: 1. Mild chronic interstitial changes without focal consolidation. 2.  Aortic Atherosclerosis (ICD10-I70.0). Electronically Signed   By: Elon Alas M.D.   On: 03/11/2018 22:42   Ct Angio Chest Pe W Or Wo Contrast  Result Date: 03/05/2018 CLINICAL DATA:  Dyspnea on exertion. EXAM: CT ANGIOGRAPHY CHEST WITH CONTRAST TECHNIQUE: Multidetector CT imaging of the chest was performed using the standard protocol during bolus administration  of intravenous contrast. Multiplanar CT image reconstructions and MIPs were obtained to evaluate the vascular anatomy. CONTRAST:  66mL ISOVUE-370 IOPAMIDOL (ISOVUE-370) INJECTION 76% COMPARISON:  CT scan of December 24, 2017. FINDINGS: Cardiovascular: Satisfactory opacification of the pulmonary arteries to the segmental level. No evidence of pulmonary embolism. Normal heart size. No pericardial effusion. Status post coronary artery bypass graft. Mediastinum/Nodes: No enlarged mediastinal, hilar, or axillary lymph nodes. Thyroid gland, trachea, and esophagus demonstrate no significant findings. Lungs/Pleura: No pneumothorax or pleural effusion is noted. Right upper lobe airspace opacity is noted consistent with pneumonia. Cluster of nodules noted in the right lower lobe posteriorly most consistent with atypical inflammation. Left lung is unremarkable. Upper Abdomen: Visualized portion of retroperitoneal mass appears to be decreased in size compared to prior exam. Musculoskeletal: No chest wall abnormality. No acute or significant osseous findings. Review of the MIP images confirms the above findings. IMPRESSION: No definite evidence of pulmonary embolus. Right upper lobe airspace opacity is noted consistent with pneumonia. Cluster of nodules is seen posteriorly in right lower lobe most consistent with atypical inflammation. Visualized portion of retroperitoneal mass appears to be decreased in size compared to prior exam. Electronically Signed   By: Marijo Conception,  M.D.   On: 03/05/2018 15:40   Nm Myocar Multi W/spect Tamela Oddi Motion / Ef  Result Date: 03/14/2018 CLINICAL DATA:  Previous CABG.  Evaluate graft patency. EXAM: MYOCARDIAL IMAGING WITH SPECT (REST AND PHARMACOLOGIC-STRESS) GATED LEFT VENTRICULAR WALL MOTION STUDY LEFT VENTRICULAR EJECTION FRACTION TECHNIQUE: Standard myocardial SPECT imaging was performed after resting intravenous injection of 10 mCi Tc-54m tetrofosmin. Subsequently, intravenous infusion  of Lexiscan was performed under the supervision of the Cardiology staff. At peak effect of the drug, 30 mCi Tc-75m tetrofosmin was injected intravenously and standard myocardial SPECT imaging was performed. Quantitative gated imaging was also performed to evaluate left ventricular wall motion, and estimate left ventricular ejection fraction. COMPARISON:  Two-view chest x-ray 03/11/2018 FINDINGS: Perfusion: A large segment of decreased uptake is present in the septum and inferior wall on both rest and stress images. There is no inducible ischemia. Wall Motion: There is marked hypokinesis involving the septum and inferior wall. Normal motion is present in the anterior and lateral walls. Left Ventricular Ejection Fraction: 37 % End diastolic volume 79 ml End systolic volume 50 ml IMPRESSION: 1. Large fixed defect compatible with infarct in the septum and inferior wall. No inducible ischemia is present. 2. Marked hypokinesis within the infarct territory of the septum and inferior wall. Wall motion is otherwise normal. 3. Left ventricular ejection fraction 37% 4. Non invasive risk stratification*: Intermediate *2012 Appropriate Use Criteria for Coronary Revascularization Focused Update: J Am Coll Cardiol. 2130;86(5):784-696. http://content.[email or web address]?[email or web address] Electronically Signed   By: San Morelle M.D.   On: 03/14/2018 14:12    Microbiology: Recent Results (from the past 240 hour(s))  Urine Culture     Status: Abnormal   Collection Time: 03/11/18  5:25 PM  Result Value Ref Range Status   Specimen Description   Final    URINE, CLEAN CATCH Performed at [facility] [address], [place], [place] [address]    Special Requests   Final    NONE Performed at [facility], [place] [address] [place], [place] [address]    Culture >=100,000 COLONIES/mL YEAST (A)  Final   Report Status 03/14/2018 FINAL  Final  Respiratory Panel by PCR      Status: None   Collection Time: 03/12/18  1:00 PM  Result Value Ref Range Status   Adenovirus NOT DETECTED NOT DETECTED Final   Coronavirus 229E NOT DETECTED NOT DETECTED Final   Coronavirus HKU1 NOT DETECTED NOT DETECTED Final   Coronavirus NL63 NOT DETECTED NOT DETECTED Final   Coronavirus OC43 NOT DETECTED NOT DETECTED Final   Metapneumovirus NOT DETECTED NOT DETECTED Final   Rhinovirus / Enterovirus NOT DETECTED NOT DETECTED Final   Influenza A NOT DETECTED NOT DETECTED Final   Influenza B NOT DETECTED NOT DETECTED Final   Parainfluenza Virus 1 NOT DETECTED NOT DETECTED Final   Parainfluenza Virus 2 NOT DETECTED NOT DETECTED Final   Parainfluenza Virus 3 NOT DETECTED NOT DETECTED Final   Parainfluenza Virus 4 NOT DETECTED NOT DETECTED Final   Respiratory Syncytial Virus NOT DETECTED NOT DETECTED Final   Bordetella pertussis NOT DETECTED NOT DETECTED Final   Chlamydophila pneumoniae NOT DETECTED NOT DETECTED Final   Mycoplasma pneumoniae NOT DETECTED NOT DETECTED Final    Comment: Performed at [facility], [place] [address], [place], [place] [address]  MRSA PCR Screening     Status: None   Collection Time: 03/12/18  1:00 PM  Result Value Ref Range Status   MRSA by PCR NEGATIVE NEGATIVE Final  Comment:        The GeneXpert MRSA Assay (FDA approved for NASAL specimens only), is one component of a comprehensive MRSA colonization surveillance program. It is not intended to diagnose MRSA infection nor to guide or monitor treatment for MRSA infections. Performed at [facility], [place] [address], [place], [place] [address]   Culture, blood (routine x 2)     Status: None (Preliminary result)   Collection Time: 03/12/18  2:12 PM  Result Value Ref Range Status   Specimen Description   Final    BLOOD LEFT ARM Performed at [facility] [address], [place], [place] [address]    Special Requests   Final    BOTTLES DRAWN AEROBIC AND  ANAEROBIC Blood Culture adequate volume Performed at [facility] [address], [place], [place] [address]    Culture   Final    NO GROWTH 2 DAYS Performed at [facility] [address] [place], [place] [address]    Report Status PENDING  Incomplete  Culture, blood (routine x 2)     Status: None (Preliminary result)   Collection Time: 03/12/18  2:12 PM  Result Value Ref Range Status   Specimen Description   Final    BLOOD RIGHT ARM Performed at [facility] [address] [place], [place] [address]    Special Requests   Final    BOTTLES DRAWN AEROBIC AND ANAEROBIC Blood Culture adequate volume Performed at [facility] [address] [place], [place] [address]    Culture   Final    NO GROWTH 2 DAYS Performed at [facility] [address] [place], [place] [address]    Report Status PENDING  Incomplete     Labs: Basic Metabolic Panel: Recent Labs  Lab 03/11/18 1843 03/13/18 0515 03/14/18 0450  NA 137  --  137  K 4.2  --  4.1  CL 106  --  106  CO2 21*  --  23  GLUCOSE 116*  --  131*  BUN 29*  --  25*  CREATININE 1.11 1.04 0.90  CALCIUM 8.6*  --  8.6*   Liver Function Tests: Recent Labs  Lab 03/11/18 1843  AST 37  ALT 21  ALKPHOS 339*  BILITOT 0.5  PROT 6.3*  ALBUMIN 3.0*   No results for input(s): LIPASE, AMYLASE in the last 168 hours. No results for input(s): AMMONIA in the last 168 hours. CBC: Recent Labs  Lab 03/11/18 1843 03/14/18 0450  WBC 16.6* 18.3*  HGB 10.3* 10.2*  HCT 32.8* 32.3*  MCV 87.9 89.0  PLT 405* 314   Cardiac Enzymes: Recent Labs  Lab 03/13/18 0840 03/13/18 1345 03/13/18 2042  TROPONINI 0.25* 0.23* 0.27*   BNP: BNP (last 3 results) Recent Labs    03/05/18 1304 03/11/18 1843  BNP 178.0* 147.5*    ProBNP (last 3 results) No results for input(s): PROBNP in the last 8760 hours.  CBG: No results for input(s): GLUCAP in the last 168  hours.     Signed:  Lelon Frohlich  [facility] Pager: [phone] 03/15/2018, 10:39 AM

## 2018-03-15 NOTE — Progress Notes (Signed)
DAILY PROGRESS NOTE   Patient Name: David Little Date of Encounter: 03/15/2018  Chief Complaint   No chest pain overnight  Patient Profile   David Little is a 82 y.o. male with a hx of CAD s/p CABG 2007, HTN, HLD and non-Hodgkin's lymphoma who is being seen today for the evaluation of elevated troponin at the request of Dr. Herbert Moors.  Subjective   David Little reports improvement in his dyspnea. Myoview yesterday shows fixed inferior perfusion defect, likely scar. No significant reversible ischemia. LVEF 37% (by echo it was 45-50% with inferior hypokinesis). This is suggestive of subacute infarct - troponin has been flat elevated.  Objective   Vitals:   03/14/18 0453 03/14/18 1312 03/14/18 2031 03/15/18 0441  BP: 97/63 109/64 111/72 105/61  Pulse: 90  92 87  Resp: 20 18 (!) 22 18  Temp: 98 F (36.7 C) 98 F (36.7 C) 98.2 F (36.8 C) 98.6 F (37 C)  TempSrc: Oral Oral Oral Oral  SpO2: 99% 99% 100% 94%    Intake/Output Summary (Last 24 hours) at 03/15/2018 0813 Last data filed at 03/15/2018 0600 Gross per 24 hour  Intake 452.68 ml  Output 600 ml  Net -147.32 ml   There were no vitals filed for this visit.  Physical Exam   General appearance: alert and no distress Lungs: diminished breath sounds bilaterally Heart: regular rate and rhythm Extremities: extremities normal, atraumatic, no cyanosis or edema Neurologic: Grossly normal  Inpatient Medications    Scheduled Meds: . aspirin EC  81 mg Oral Daily  . enoxaparin (LOVENOX) injection  40 mg Subcutaneous Q24H  . lactose free nutrition  237 mL Oral TID WC  . metoprolol tartrate  25 mg Oral BID  . sodium chloride flush  10-40 mL Intracatheter Q12H  . tamsulosin  0.4 mg Oral QHS    Continuous Infusions: . sodium chloride 250 mL (03/14/18 1313)  . ceFEPime (MAXIPIME) IV 1 g (03/15/18 0136)    PRN Meds: sodium chloride, ondansetron, sodium chloride flush, sodium chloride flush, traMADol   Labs    Results for orders placed or performed during the hospital encounter of 03/11/18 (from the past 48 hour(s))  Troponin I (q 6hr x 3)     Status: Abnormal   Collection Time: 03/13/18  8:40 AM  Result Value Ref Range   Troponin I 0.25 (HH) <0.03 ng/mL    Comment: CRITICAL RESULT CALLED TO, READ BACK BY AND VERIFIED WITH: BLOCK,D. RN AT 2482 03/13/18 MULLINS,T Performed at [facility], [place] [address], [place], [place] [address]   Troponin I (q 6hr x 3)     Status: Abnormal   Collection Time: 03/13/18  1:45 PM  Result Value Ref Range   Troponin I 0.23 (HH) <0.03 ng/mL    Comment: CRITICAL VALUE NOTED.  VALUE IS CONSISTENT WITH PREVIOUSLY REPORTED AND CALLED VALUE. Performed at [facility], [place] [address], [place], [place] [address]   Troponin I (q 6hr x 3)     Status: Abnormal   Collection Time: 03/13/18  8:42 PM  Result Value Ref Range   Troponin I 0.27 (HH) <0.03 ng/mL    Comment: CRITICAL VALUE NOTED.  VALUE IS CONSISTENT WITH PREVIOUSLY REPORTED AND CALLED VALUE. Performed at [facility], [place] [address], [place], [place] [address]   Basic metabolic panel     Status: Abnormal   Collection Time: 03/14/18  4:50 AM  Result Value Ref Range   Sodium 137  135 - 145 mmol/L   Potassium 4.1 3.5 - 5.1 mmol/L   Chloride 106 98 - 111 mmol/L   CO2 23 22 - 32 mmol/L   Glucose, Bld 131 (H) 70 - 99 mg/dL   BUN 25 (H) 8 - 23 mg/dL   Creatinine, Ser 0.90 0.61 - 1.24 mg/dL   Calcium 8.6 (L) 8.9 - 10.3 mg/dL   GFR calc non Af Amer >60 >60 mL/min   GFR calc Af Amer >60 >60 mL/min    Comment: (NOTE) The eGFR has been calculated using the CKD EPI equation. This calculation has not been validated in all clinical situations. eGFR's persistently <60 mL/min signify possible Chronic Kidney Disease.    Anion gap 8 5 - 15    Comment: Performed at [facility], [place] [address] [place], [place] [address]  CBC     Status:  Abnormal   Collection Time: 03/14/18  4:50 AM  Result Value Ref Range   WBC 18.3 (H) 4.0 - 10.5 K/uL   RBC 3.63 (L) 4.22 - 5.81 MIL/uL   Hemoglobin 10.2 (L) 13.0 - 17.0 g/dL   HCT 32.3 (L) 39.0 - 52.0 %   MCV 89.0 80.0 - 100.0 fL   MCH 28.1 26.0 - 34.0 pg   MCHC 31.6 30.0 - 36.0 g/dL   RDW 18.6 (H) 11.5 - 15.5 %   Platelets 314 150 - 400 K/uL   nRBC 0.0 0.0 - 0.2 %    Comment: Performed at [facility], [place] [address], [place], [place] [address]    ECG   N/A  Telemetry   N/A  Radiology    Nm Myocar Multi W/spect W/wall Motion / Ef  Result Date: 03/14/2018 CLINICAL DATA:  Previous CABG.  Evaluate graft patency. EXAM: MYOCARDIAL IMAGING WITH SPECT (REST AND PHARMACOLOGIC-STRESS) GATED LEFT VENTRICULAR WALL MOTION STUDY LEFT VENTRICULAR EJECTION FRACTION TECHNIQUE: Standard myocardial SPECT imaging was performed after resting intravenous injection of 10 mCi Tc-28mtetrofosmin. Subsequently, intravenous infusion of Lexiscan was performed under the supervision of the Cardiology staff. At peak effect of the drug, 30 mCi Tc-959metrofosmin was injected intravenously and standard myocardial SPECT imaging was performed. Quantitative gated imaging was also performed to evaluate left ventricular wall motion, and estimate left ventricular ejection fraction. COMPARISON:  Two-view chest x-ray 03/11/2018 FINDINGS: Perfusion: A large segment of decreased uptake is present in the septum and inferior wall on both rest and stress images. There is no inducible ischemia. Wall Motion: There is marked hypokinesis involving the septum and inferior wall. Normal motion is present in the anterior and lateral walls. Left Ventricular Ejection Fraction: 37 % End diastolic volume 79 ml End systolic volume 50 ml IMPRESSION: 1. Large fixed defect compatible with infarct in the septum and inferior wall. No inducible ischemia is present. 2. Marked hypokinesis within the infarct territory of the septum  and inferior wall. Wall motion is otherwise normal. 3. Left ventricular ejection fraction 37% 4. Non invasive risk stratification*: Intermediate *2012 Appropriate Use Criteria for Coronary Revascularization Focused Update: J Am Coll Cardiol. 200488;89(1):694-503http://content.[email or web address]?[email or web address] Electronically Signed   By: ChSan Morelle.D.   On: 03/14/2018 14:12    Cardiac Studies   LV EF: 45% -   50%  ------------------------------------------------------------------- Indications:      Chest pain 786.51.  ------------------------------------------------------------------- Study Conclusions  - Left ventricle: The cavity size was normal. Wall thickness was   normal. Systolic function was mildly reduced. The estimated   ejection fraction was in the  range of 45% to 50%. Abnormal strain   in the septal and lateral walls. There was septal hypokinesis,   lateral hypokinesis, and inferior hypokinesis. Possible   multivessel coronary disease. - Aortic valve: There was mild regurgitation. - Mitral valve: There was mild regurgitation. - Right ventricle: The cavity size was normal. Systolic function   was normal.  Impressions:  - Limited echo for LV function.  Assessment   Principal Problem:   Dyspnea on exertion Active Problems:   Community acquired pneumonia of right upper lobe of lung ([place])   Essential hypertension   Plan   1. There is evidence for inferior scar on myoview stress testing, but no significant ischemia. He denies chest pain and his dyspnea is improving. BNP is low at 147 on 10/30. Would recommend medical therapy for this. Ok to continue chemotherapy. He is on aspirin and lopressor. Will switch lopressor to Toprol XL. BP will not support the addition of ACE-I/ARB or ARNI at this point.   No further cardiac work-up planned. Disposition per primary service.  Time Spent Directly with Patient:  I have spent a total of 25 minutes  with the patient reviewing hospital notes, telemetry, EKGs, labs and examining the patient as well as establishing an assessment and plan that was discussed personally with the patient.  > 50% of time was spent in direct patient care.  Length of Stay:  LOS: 4 days   Pixie Casino, MD, [facility], [place] Director of the Advanced Lipid Disorders &  Cardiovascular Risk Reduction Clinic Diplomate of the American Board of Clinical Lipidology Attending Cardiologist  Direct Dial: [phone]  Fax: [phone]  Website:  [email or web address] Osgood Laniece Hornbaker 03/15/2018, 8:13 AM

## 2018-03-16 DIAGNOSIS — Z8572 Personal history of non-Hodgkin lymphomas: Secondary | ICD-10-CM

## 2018-03-16 NOTE — Progress Notes (Addendum)
2:00PM: CSW spoke with patients spouse, Mearl Latin, and informed her of current bed offers. Family has decided on [facility]. CSW has notified facility and requested authorization be started. CSW will continue to update.   Per RN CM, family is requesting [facility] SNF at discharge- CSW has faxed needed information to facility and is waiting return call. Patient does have [facility] and will be needing prior authorization before being able to discharge to facility.   Kingsley Spittle, LCSW Clinical Social Worker  [facility]  [phone]

## 2018-03-16 NOTE — Progress Notes (Signed)
Patient seen and examined, database reviewed.  Discussed with wife at bedside.    Vitals:   03/15/18 2052 03/16/18 0527  BP: 111/67 100/64  Pulse: (!) 104 74  Resp: 18 19  Temp: 97.7 F (36.5 C) 97.9 F (36.6 C)  SpO2: 99% 100%   Patient was medically cleared for discharge as of 11/3.  Please refer to discharge summary for further details.  Unfortunately, he continues to complain of severe weakness and difficulty with ambulation.  PT consultation requested on 11/3 is recommending skilled nursing.  Social work is aware and placement search has commenced, unfortunately will need insurance authorization and will remain hospitalized until that is obtained.  Domingo Mend, MD [facility] Pager: [phone]

## 2018-03-16 NOTE — Progress Notes (Signed)
IP PROGRESS NOTE  Subjective:   David Little reports partial improvement dyspnea, but he has not ambulated.  He has pain in the shoulders and back when he repositions in bed.  He was diagnosed with a myocardial infarction by cardiology. Objective: Vital signs in last 24 hours: Blood pressure 100/64, pulse 74, temperature 97.9 F (36.6 C), temperature source Oral, resp. rate 19, SpO2 100 %.  Intake/Output from previous day: 11/03 0701 - 11/04 0700 In: 450 [P.O.:450] Out: 525 [Urine:525]  Physical Exam: HEENT-no thrush or ulcers Lungs-distant breath sounds, no respiratory distress Cardiac-regular rate and rhythm Abdomen-no hepatosplenomegaly Vascular-no leg edema    Portacath/PICC-without erythema  Lab Results: Recent Labs    03/14/18 0450  WBC 18.3*  HGB 10.2*  HCT 32.3*  PLT 314    BMET Recent Labs    03/14/18 0450  NA 137  K 4.1  CL 106  CO2 23  GLUCOSE 131*  BUN 25*  CREATININE 0.90  CALCIUM 8.6*    No results found for: CEA1  Studies/Results: Nm Myocar Multi W/spect W/wall Motion / Ef  Result Date: 03/14/2018 CLINICAL DATA:  Previous CABG.  Evaluate graft patency. EXAM: MYOCARDIAL IMAGING WITH SPECT (REST AND PHARMACOLOGIC-STRESS) GATED LEFT VENTRICULAR WALL MOTION STUDY LEFT VENTRICULAR EJECTION FRACTION TECHNIQUE: Standard myocardial SPECT imaging was performed after resting intravenous injection of 10 mCi Tc-38m tetrofosmin. Subsequently, intravenous infusion of Lexiscan was performed under the supervision of the Cardiology staff. At peak effect of the drug, 30 mCi Tc-22m tetrofosmin was injected intravenously and standard myocardial SPECT imaging was performed. Quantitative gated imaging was also performed to evaluate left ventricular wall motion, and estimate left ventricular ejection fraction. COMPARISON:  Two-view chest x-ray 03/11/2018 FINDINGS: Perfusion: A large segment of decreased uptake is present in the septum and inferior wall on both rest and  stress images. There is no inducible ischemia. Wall Motion: There is marked hypokinesis involving the septum and inferior wall. Normal motion is present in the anterior and lateral walls. Left Ventricular Ejection Fraction: 37 % End diastolic volume 79 ml End systolic volume 50 ml IMPRESSION: 1. Large fixed defect compatible with infarct in the septum and inferior wall. No inducible ischemia is present. 2. Marked hypokinesis within the infarct territory of the septum and inferior wall. Wall motion is otherwise normal. 3. Left ventricular ejection fraction 37% 4. Non invasive risk stratification*: Intermediate *2012 Appropriate Use Criteria for Coronary Revascularization Focused Update: J Am Coll Cardiol. 7169;67(8):938-101. [email or web address] Electronically Signed   By: San Morelle M.D.   On: 03/14/2018 14:12    Medications: I have reviewed the patient's current medications.  Assessment/Plan: 1. Non-Hodgkin lymphoma - he completed fludarabine chemotherapy in February 2003. He remains in clinical remission.  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 Indeterminate lesion in the left liver, tubular structure in the right lower quadrant concerning for mucocele, indeterminate left lower lobe nodule  CT biopsy of retroperitoneal mass 12/25/2017-diffuse large B-cell lymphoma, CD20 positive  Cycle 1 CHOP/Rituxan 01/01/2018  PET scan 7/51/0258-NIDPO hypermetabolic retroperitoneal and mesenteric mass; mild thoracic involvement including a hypermetabolic nodule in the suprasternal notch and a small hypermetabolic left periaortic lymph node;musculoskeletal involvement including the left T2  vertebrae, potentially the right distal medial sternocleidomastoid muscle and the right humeral shaft; approximately 5 hypermetabolic liver lesions; accentuated activity near the orifice  of the appendix; other questionable activity along the gastrointestinal tract including scattered esophageal activity and in the proximal stomach without definite correlating CT abnormalities; left hydronephrosis and hydroureter extending down to the somewhat distended urinary bladder which demonstrates wall thickening and trabeculation.  Cycle 2 CHOP/Rituxan 01/22/2018 (Adriamycin and Cytoxan dose reduced due to severe neutropenia following cycle 1)  Cycle 3 CHOP/rituximab 02/12/2018  Decreased retroperitoneal mass on CT chest 03/05/2018  2. History of coronary artery disease - status post coronary artery bypass surgery. 3. Pneumococcal vaccine in April 2014 4. Left knee pain and swelling-? Left knee arthritis 5.Hypercalcemia 12/19/2017, status post Zometa 12/19/2017-improved 6.Elevated creatininesecondary to obstructing left peritoneal mass  CT 12/24/2017-left hydronephrosis secondary to obstructing retroperitoneal mass 7.Anorexia/weight loss/failure to thrive 8.Echocardiogram 12/31/2017-LVEF 50-55%, mild diffuse hypokinesis  Echocardiogram 03/12/2018- diffuse hypokinesis, moderately dilated cavity, estimated LVEF 45-50% 9.Neutropenia secondary to chemotherapy. He will begin ciprofloxacin. 10.Nausea/vomiting, question delayed nausea related to chemotherapy. IV fluids, Zofran/dexamethasone; Zofran as needed at home. 11.Constipation, question secondary to chemotherapy. Begin MiraLAX. 12.Oral candidiasis, question early mucositis. He completeda course of Diflucan.Resolved. 13.  Exertional dyspnea, cough, failure to thrive 03/05/2018-CT chest revealed right upper lobe pneumonia, no evidence of pulmonary embolus but decreased retroperitoneal mass 14.  Subacute myocardial infarction  confirmed during hospital admission 02/09/2018- echocardiogram with LVEF 45-50%, nuclear study with a fixed defect at the septum and inferior wall and ejection fraction of 37%- persistently elevated troponin consistent with subacute MI   David Little appears improved compared to hospital admission, but he is not yet ambulatory.  He is scheduled for discharge to a skilled nursing facility today.  It appears his symptoms were related to pneumonia and a myocardial infarction. We will plan to resume R-CHOP chemotherapy if his performance status improves over the next few weeks.  Recommendations: 1.  Ambulate patient prior to discharge-discussed with nursing staff 2.  Management of myocardial infarction per cardiology 3.  Outpatient follow-up will be scheduled at the Cancer center for the week of 03/23/2018.    LOS: 5 days   Betsy Coder, MD   03/16/2018, 7:33 AM

## 2018-03-16 NOTE — Progress Notes (Signed)
Physical Therapy Treatment Patient Details Name: David Little MRN: [id number] DOB: [birth year] Today's Date: 03/16/2018    History of Present Illness David Little is a 82 y.o. male with medical history significant of non-hodgkin lymphoma, hyperlipidemia, CAD s/p CABG, L TKA. adm with Dyspnea on exertion, flu negative, possible PNA    PT Comments    Pt was agreeable to walk in the PM and noted his willingness being a good change with pt surprising himself at the ability.  He has been in the bed all weekend and now is feeling more secure with moving, with less light headedness and weakness being noted.  Follow along acutely until DC to SNF, and asked pt to sit up for dinner tonight.  His chair is prepared and he is instructed to ask nursing to assist himself, but wife present to hear instructions as well.   Follow Up Recommendations  SNF     Equipment Recommendations  None recommended by PT    Recommendations for Other Services       Precautions / Restrictions Precautions Precautions: Fall Restrictions Weight Bearing Restrictions: No    Mobility  Bed Mobility Overal bed mobility: Needs Assistance Bed Mobility: Supine to Sit;Sit to Supine     Supine to sit: Min guard Sit to supine: Min guard   General bed mobility comments: using bedrail and extra time  Transfers Overall transfer level: Needs assistance Equipment used: Rolling walker (2 wheeled) Transfers: Sit to/from Stand Sit to Stand: Min assist         General transfer comment: min to power up from bed due to being in bed most of the weekend  Ambulation/Gait Ambulation/Gait assistance: Min guard Gait Distance (Feet): 18 Feet Assistive device: Rolling walker (2 wheeled);1 person hand held assist Gait Pattern/deviations: Step-through pattern;Decreased stride length;Wide base of support Gait velocity: reduced Gait velocity interpretation: <1.31 ft/sec, indicative of household ambulator General Gait  Details: took his time with minor SOB   Stairs             Wheelchair Mobility    Modified Rankin (Stroke Patients Only)       Balance Overall balance assessment: Needs assistance Sitting-balance support: Feet supported Sitting balance-Leahy Scale: Good     Standing balance support: Bilateral upper extremity supported Standing balance-Leahy Scale: Fair Standing balance comment: using walker to steady dynamically                            Cognition Arousal/Alertness: Awake/alert Behavior During Therapy: WFL for tasks assessed/performed Overall Cognitive Status: Within Functional Limits for tasks assessed                                        Exercises      General Comments        Pertinent Vitals/Pain Pain Assessment: No/denies pain    Home Living                      Prior Function            PT Goals (current goals can now be found in the care plan section) Acute Rehab PT Goals Patient Stated Goal: to get home asap Progress towards PT goals: Progressing toward goals    Frequency    Min 3X/week      PT Plan Current plan remains appropriate  Co-evaluation              AM-PAC PT "6 Clicks" Daily Activity  Outcome Measure  Difficulty turning over in bed (including adjusting bedclothes, sheets and blankets)?: A Little Difficulty moving from lying on back to sitting on the side of the bed? : Unable Difficulty sitting down on and standing up from a chair with arms (e.g., wheelchair, bedside commode, etc,.)?: Unable Help needed moving to and from a bed to chair (including a wheelchair)?: A Little Help needed walking in hospital room?: A Little Help needed climbing 3-5 steps with a railing? : A Lot 6 Click Score: 13    End of Session Equipment Utilized During Treatment: Gait belt Activity Tolerance: Patient tolerated treatment well Patient left: with call bell/phone within reach;in bed;with bed  alarm set Nurse Communication: Mobility status PT Visit Diagnosis: Muscle weakness (generalized) (M62.81);Difficulty in walking, not elsewhere classified (R26.2)     Time: 0211-1552 PT Time Calculation (min) (ACUTE ONLY): 24 min  Charges:  $Gait Training: 8-22 mins $Therapeutic Activity: 8-22 mins                     David Little 03/16/2018, 6:23 PM   Mee Hives, PT MS Acute Rehab Dept. Number: [place] and [place]

## 2018-03-16 NOTE — Progress Notes (Signed)
PT Cancellation Note  Patient Details Name: David Little MRN: [id number] DOB: Dec 17, 1935   Cancelled Treatment:    Reason Eval/Treat Not Completed: Other (comment).  Pt declined therapy and will work with him to try again this afternoon if he is still here.  Pt in agreement for PT to retry later.   Ramond Dial 03/16/2018, 12:31 PM   Mee Hives, PT MS Acute Rehab Dept. Number: [place] and [place]

## 2018-03-16 NOTE — NC FL2 (Signed)
[place] LEVEL OF CARE SCREENING TOOL     IDENTIFICATION  Patient Name: David Little Birthdate: 1936/03/21 Sex: male Admission Date (Current Location): 03/11/2018  [facility] and [place] Number:  Herbalist and Address:  [facility],  [place], [place]      Provider Number: [id number]  Attending Physician Name and Address:  Isaac Bliss, Luis Lopez  Relative Name and Phone Number:       Current Level of Care: Hospital Recommended Level of Care: [place] Prior Approval Number:    Date Approved/Denied:   PASRR Number:   [id number]  Discharge Plan: SNF    Current Diagnoses: Patient Active Problem List   Diagnosis Date Noted  . Protein-calorie malnutrition, severe 03/15/2018  . Inferior MI ([place])   . Dyspnea on exertion 03/11/2018  . Community acquired pneumonia of right upper lobe of lung ([place]) 03/11/2018  . Essential hypertension 03/11/2018  . Hypercalcemia 12/19/2017  . Non Hodgkin's lymphoma ([place]) 09/09/2014  . Osteoarthritis of left knee 09/29/2013  . CAD (coronary artery disease) 01/07/2011  . Hyperlipidemia 01/07/2011    Orientation RESPIRATION BLADDER Height & Weight     Self, Time, Situation  Normal Continent Weight:   Height:     BEHAVIORAL SYMPTOMS/MOOD NEUROLOGICAL BOWEL NUTRITION STATUS        Diet(regular )  AMBULATORY STATUS COMMUNICATION OF NEEDS Skin   Limited Assist Verbally Normal                       Personal Care Assistance Level of Assistance  Bathing, Feeding, Dressing Bathing Assistance: Limited assistance Feeding assistance: Independent Dressing Assistance: Limited assistance     Functional Limitations Info             SPECIAL CARE FACTORS FREQUENCY  PT (By licensed PT), OT (By licensed OT)     PT Frequency: 5 OT Frequency: 5            Contractures      Additional Factors Info  Code Status, Allergies Code Status Info: Full Code   Allergies Info: NKA            Current Medications (03/16/2018):  This is the current hospital active medication list Current Facility-Administered Medications  Medication Dose Route Frequency Provider Last Rate Last Dose  . 0.9 %  sodium chloride infusion   Intravenous PRN Purohit, Shrey C, MD 10 mL/hr at 03/14/18 1313 250 mL at 03/14/18 1313  . aspirin EC tablet 81 mg  81 mg Oral Daily Mariel Aloe, MD   81 mg at 03/16/18 0947  . ceFEPIme (MAXIPIME) 1 g in sodium chloride 0.9 % 100 mL IVPB  1 g Intravenous Q12H Adrian Saran, [facility]   Stopped at 03/15/18 0245  . enoxaparin (LOVENOX) injection 40 mg  40 mg Subcutaneous Q24H Purohit, Shrey C, MD   40 mg at 03/15/18 2246  . lactose free nutrition (BOOST PLUS) liquid 237 mL  237 mL Oral TID WC Purohit, Shrey C, MD   237 mL at 03/16/18 0756  . metoprolol succinate (TOPROL-XL) 24 hr tablet 50 mg  50 mg Oral Daily Pixie Casino, MD   50 mg at 03/16/18 0924  . ondansetron (ZOFRAN-ODT) disintegrating tablet 8 mg  8 mg Oral Q8H PRN Mariel Aloe, MD   8 mg at 03/15/18 0849  . sodium chloride flush (NS) 0.9 % injection 10-40 mL  10-40 mL Intracatheter Q12H Mariel Aloe,  MD   10 mL at 03/14/18 2209  . sodium chloride flush (NS) 0.9 % injection 10-40 mL  10-40 mL Intracatheter PRN Mariel Aloe, MD   10 mL at 03/14/18 0451  . sodium chloride flush (NS) 0.9 % injection 10-40 mL  10-40 mL Intracatheter PRN Purohit, Shrey C, MD   10 mL at 03/15/18 1157  . tamsulosin (FLOMAX) capsule 0.4 mg  0.4 mg Oral QHS Mariel Aloe, MD   0.4 mg at 03/15/18 2245  . traMADol (ULTRAM) tablet 50 mg  50 mg Oral Q6H PRN Mariel Aloe, MD   50 mg at 03/16/18 3546   Facility-Administered Medications Ordered in Other Encounters  Medication Dose Route Frequency Provider Last Rate Last Dose  . heparin lock flush 100 unit/mL  500 Units Intracatheter Once PRN Ladell Pier, MD      . regadenoson Carlton Adam) injection SOLN 0.4 mg  0.4 mg Intravenous Once  Daune Perch, NP      . sodium chloride flush (NS) 0.9 % injection 10 mL  10 mL Intracatheter Once PRN Ladell Pier, MD         Discharge Medications: Please see discharge summary for a list of discharge medications.  Relevant Imaging Results:  Relevant Lab Results:   Additional Information [id number]  [name] Anna, LCSW

## 2018-03-17 DIAGNOSIS — E43 Unspecified severe protein-calorie malnutrition: Secondary | ICD-10-CM

## 2018-03-17 LAB — CULTURE, BLOOD (ROUTINE X 2)
Culture: NO GROWTH
Culture: NO GROWTH
Special Requests: ADEQUATE
Special Requests: ADEQUATE

## 2018-03-17 NOTE — Progress Notes (Addendum)
CSW notes patient's family requesting [facility] SNF. CSW attempted to reach [facility] [phone]) to determine if insurance authorization has been started. Left detailed voicemail requesting returned call.  10:20am [facility] has declined patient for SNF. CSW spoke with patient's wife Mearl Latin to inform her. Patient and family are agreeable to [facility]. CSW contacted [facility] to start insurance authorization.  12:40pm [facility] started authorization, will likely not have a SNF bed until Thursday, 11/07.   CSW following patient for discharge needs.  Stephanie Acre, [place] Social Worker [phone]

## 2018-03-17 NOTE — Progress Notes (Signed)
PROGRESS NOTE    David Little  JJH:[id number] DOB: Nov 22, 1935 DOA: 03/11/2018 PCP: Chipper Herb, MD      Brief Narrative:  David Little is a 82 y.o. M with Non-hodgkins lymphoma, CAD s/p CABG in 2007 who presented to Oncology clinic with progressive dyspnea despite treatment for pneumonia, weakness, and falls, was direct admitted for DOE.   Assessment & Plan:  NSTEMI Hypertension Patient initially admitted with dyspnea on exertion, found to have elevated troponin, low and flat.  Echo showed worsening of EF with regional wall motion variation.  Follow up nuclear study showed new nonreversible territory, Cardiology suspect he had a subacute infarct.  Medical therapy was recommended. Acute coronary syndrome (MI, NSTEMI, STEMI, etc) this admission?: Yes.     AHA/ACC Clinical Performance & Quality Measures: 1. Aspirin prescribed? - Yes 2. ADP Receptor Inhibitor (Plavix/Clopidogrel, Brilinta/Ticagrelor or Effient/Prasugrel) prescribed (includes medically managed patients)? - No - not recommended by Cardiology 3. Beta Blocker prescribed? - Yes 4. High Intensity Statin (Lipitor 40-80mg  or Crestor 20-40mg ) prescribed? - No - not recmomended by Cardiology 5. EF assessed during THIS hospitalization? - Yes 6. For EF <40%, was ACEI/ARB prescribed? - Not Applicable (EF >/= 81%) 7. For EF <40%, Aldosterone Antagonist (Spironolactone or Eplerenone) prescribed? - Not Applicable (EF >/= 85%) 8. Cardiac Rehab Phase II ordered (Included Medically managed Patients)? - No - not recommended by Cardiology   Community acquired pneumonia, right upper lobe Completed 5 days Cefepime.  Asymptomatic.  NHL Recurrence this year, restarted on chemotherapy  -Oncology consulted, appreciate cares  BPH -Continue FLomax      MDM and disposition: The below labs and imaging reports were reviewed and summarized above.  Medication management as above.  The patient was admitted with dyspnea, poor  functional status.  It appeasr that some of this was from an NSTEMI that likely happened in the week or so before admission.  He was evaluated by Cardiology, who recommended medical management.  At baseline, he is able to transfer to/from bed or to/from chair without assistace.  Ambulates in the home with walker independently, toilets independently, and uses a cane outside the home.  At present he requires assistance to transfer out of bed, cannot walk unassisted, only able to walk a few feet.  He will require SNF care for rehab, currently authorization is pending.       DVT prophylaxis: Lovenox Code Status: FULL Family Communication: Wife     Consultants:   Cardiology  Oncology  Procedures:   Nuclear medicine  Echocardiogram  Antimicrobials:   Cefepime    Subjective: Feeling well.  Still very tired.  Still somewhat dyspneic.  No confusion, weakness.  No fever, cough, sputum.  Objective: Vitals:   03/16/18 1402 03/16/18 1500 03/16/18 1939 03/17/18 0455  BP: (!) 100/59 102/60 113/71 104/64  Pulse: 95  98 88  Resp: 16  (!) 22 20  Temp: 99.1 F (37.3 C)  97.7 F (36.5 C) 97.9 F (36.6 C)  TempSrc: Oral  Oral Oral  SpO2: 100%  98% 96%    Intake/Output Summary (Last 24 hours) at 03/17/2018 1408 Last data filed at 03/17/2018 0926 Gross per 24 hour  Intake 828.9 ml  Output 425 ml  Net 403.9 ml   There were no vitals filed for this visit.  Examination: General appearance: thin  adult male, alert and in no acute distress.   Skin: Warm and dry.  no jaundice.  No suspicious rashes or lesions. Cardiac: RRR, nl S1-S2, no murmurs  appreciated.  No LE edema Respiratory: Normal respiratory rate and rhythm.  CTAB without rales or wheezes. Abdomen: Abdomen soft.  No TTP. No ascites, distension, hepatosplenomegaly.   Neuro: Awake and alert.  EOMI, moves all extremities but needs assistance to sit up, globally very weak. Speech fluent.    Psych: Sensorium intact and  responding to questions, attention normal. Affect blunhted.  Judgment and insight appear impaired by dementia.    Data Reviewed: I have personally reviewed following labs and imaging studies:  CBC: Recent Labs  Lab 03/11/18 1843 03/14/18 0450  WBC 16.6* 18.3*  HGB 10.3* 10.2*  HCT 32.8* 32.3*  MCV 87.9 89.0  PLT 405* 341   Basic Metabolic Panel: Recent Labs  Lab 03/11/18 1843 03/13/18 0515 03/14/18 0450  NA 137  --  137  K 4.2  --  4.1  CL 106  --  106  CO2 21*  --  23  GLUCOSE 116*  --  131*  BUN 29*  --  25*  CREATININE 1.11 1.04 0.90  CALCIUM 8.6*  --  8.6*   GFR: Estimated Creatinine Clearance: 54.1 mL/min (by C-G formula based on SCr of 0.9 mg/dL). Liver Function Tests: Recent Labs  Lab 03/11/18 1843  AST 37  ALT 21  ALKPHOS 339*  BILITOT 0.5  PROT 6.3*  ALBUMIN 3.0*   No results for input(s): LIPASE, AMYLASE in the last 168 hours. No results for input(s): AMMONIA in the last 168 hours. Coagulation Profile: No results for input(s): INR, PROTIME in the last 168 hours. Cardiac Enzymes: Recent Labs  Lab 03/13/18 0840 03/13/18 1345 03/13/18 2042  TROPONINI 0.25* 0.23* 0.27*   BNP (last 3 results) No results for input(s): PROBNP in the last 8760 hours. HbA1C: No results for input(s): HGBA1C in the last 72 hours. CBG: No results for input(s): GLUCAP in the last 168 hours. Lipid Profile: No results for input(s): CHOL, HDL, LDLCALC, TRIG, CHOLHDL, LDLDIRECT in the last 72 hours. Thyroid Function Tests: No results for input(s): TSH, T4TOTAL, FREET4, T3FREE, THYROIDAB in the last 72 hours. Anemia Panel: No results for input(s): VITAMINB12, FOLATE, FERRITIN, TIBC, IRON, RETICCTPCT in the last 72 hours. Urine analysis:    Component Value Date/Time   COLORURINE YELLOW 03/11/2018 1725   APPEARANCEUR TURBID (A) 03/11/2018 1725   LABSPEC 1.015 03/11/2018 1725   PHURINE 6.0 03/11/2018 1725   GLUCOSEU NEGATIVE 03/11/2018 1725   HGBUR MODERATE (A)  03/11/2018 1725   BILIRUBINUR NEGATIVE 03/11/2018 1725   KETONESUR NEGATIVE 03/11/2018 1725   PROTEINUR 100 (A) 03/11/2018 1725   UROBILINOGEN 0.2 09/22/2013 1106   NITRITE NEGATIVE 03/11/2018 1725   LEUKOCYTESUR LARGE (A) 03/11/2018 1725   Sepsis Labs: @LABRCNTIP (procalcitonin:4,lacticacidven:4)  ) Recent Results (from the past 240 hour(s))  Urine Culture     Status: Abnormal   Collection Time: 03/11/18  5:25 PM  Result Value Ref Range Status   Specimen Description   Final    URINE, CLEAN CATCH Performed at [facility], [place] [address], [place], [place] [address]    Special Requests   Final    NONE Performed at [facility], [place] [address] [place], [place] [address]    Culture >=100,000 COLONIES/mL YEAST (A)  Final   Report Status 03/14/2018 FINAL  Final  Respiratory Panel by PCR     Status: None   Collection Time: 03/12/18  1:00 PM  Result Value Ref Range Status   Adenovirus NOT DETECTED NOT DETECTED Final   Coronavirus 229E NOT  DETECTED NOT DETECTED Final   Coronavirus HKU1 NOT DETECTED NOT DETECTED Final   Coronavirus NL63 NOT DETECTED NOT DETECTED Final   Coronavirus OC43 NOT DETECTED NOT DETECTED Final   Metapneumovirus NOT DETECTED NOT DETECTED Final   Rhinovirus / Enterovirus NOT DETECTED NOT DETECTED Final   Influenza A NOT DETECTED NOT DETECTED Final   Influenza B NOT DETECTED NOT DETECTED Final   Parainfluenza Virus 1 NOT DETECTED NOT DETECTED Final   Parainfluenza Virus 2 NOT DETECTED NOT DETECTED Final   Parainfluenza Virus 3 NOT DETECTED NOT DETECTED Final   Parainfluenza Virus 4 NOT DETECTED NOT DETECTED Final   Respiratory Syncytial Virus NOT DETECTED NOT DETECTED Final   Bordetella pertussis NOT DETECTED NOT DETECTED Final   Chlamydophila pneumoniae NOT DETECTED NOT DETECTED Final   Mycoplasma pneumoniae NOT DETECTED NOT DETECTED Final    Comment: Performed at [facility], [place] [address] [place], [place]  [address]  MRSA PCR Screening     Status: None   Collection Time: 03/12/18  1:00 PM  Result Value Ref Range Status   MRSA by PCR NEGATIVE NEGATIVE Final    Comment:        The GeneXpert MRSA Assay (FDA approved for NASAL specimens only), is one component of a comprehensive MRSA colonization surveillance program. It is not intended to diagnose MRSA infection nor to guide or monitor treatment for MRSA infections. Performed at [facility], [place] [address], [place], [place] [address]   Culture, blood (routine x 2)     Status: None (Preliminary result)   Collection Time: 03/12/18  2:12 PM  Result Value Ref Range Status   Specimen Description   Final    BLOOD LEFT ARM Performed at [facility] [address], [place], [place] [address]    Special Requests   Final    BOTTLES DRAWN AEROBIC AND ANAEROBIC Blood Culture adequate volume Performed at [facility] [address], [place], [place] [address]    Culture   Final    NO GROWTH 4 DAYS Performed at [facility], [place] [address] [place], [place] [address]    Report Status PENDING  Incomplete  Culture, blood (routine x 2)     Status: None (Preliminary result)   Collection Time: 03/12/18  2:12 PM  Result Value Ref Range Status   Specimen Description   Final    BLOOD RIGHT ARM Performed at [facility] [address] [place], [place] [address]    Special Requests   Final    BOTTLES DRAWN AEROBIC AND ANAEROBIC Blood Culture adequate volume Performed at [facility] [address], [place], [place] [address]    Culture   Final    NO GROWTH 4 DAYS Performed at [facility], [place] [address], [place], [place] [address]    Report Status PENDING  Incomplete         Radiology Studies: No results found.      Scheduled Meds: . aspirin EC  81 mg Oral Daily  . enoxaparin (LOVENOX) injection  40 mg Subcutaneous Q24H  .  lactose free nutrition  237 mL Oral TID WC  . metoprolol succinate  50 mg Oral Daily  . sodium chloride flush  10-40 mL Intracatheter Q12H  . tamsulosin  0.4 mg Oral QHS   Continuous Infusions: . sodium chloride 250 mL (03/14/18 1313)     LOS: 5 days    Time spent:  25 minutes    Edwin Dada, MD [facility] 03/17/2018, 2:08 PM     Pager [phone] --- please page though AMION:  [email or web address] Password [email or web address] If 7PM-7AM, please contact night-coverage

## 2018-03-17 NOTE — Progress Notes (Signed)
CSW followed up with patient and spouse at bedside. Patient and family have selected [facility] for SNF. Insurance authorization has been started and is still pending. [facility] does not have bed availability today but will have a bed for patient on Thursday. Patient and family agreeable.  Patient's wife provided CSW a cell phone number to call with updates: [phone]  CSW following for discharge needs.  Stephanie Acre, [place] Social Worker [phone]

## 2018-03-18 NOTE — Progress Notes (Signed)
PROGRESS NOTE    David Little  VFI:[id number] DOB: [birth year] DOA: 03/11/2018 PCP: Chipper Herb, MD      Brief Narrative:  David Little is a 82 y.o. M with Non-hodgkins lymphoma, CAD s/p CABG in 2007 who presented to Oncology clinic with progressive dyspnea despite treatment for pneumonia, weakness, and falls, was direct admitted for DOE.   Assessment & Plan:   New delirium Patient normally oriented without dementia.  Today, per wife, he is talking about things that aren't there, hard to focus, inattentive, agitated and restless.   I feel he is continuing to decline, likely taking a turn for the worse.  His short term prognosis is worsening. -Consult Palliative care re: possible Hospice   NSTEMI Hypertension Patient initially admitted with dyspnea on exertion, found to have elevated troponin, low and flat.  Echo showed worsening of EF with regional wall motion variation.  Follow up nuclear study showed new nonreversible territory, Cardiology suspect he had a subacute infarct.  Given the time elapsed, as well as his obvious and severe functional decline, PCI was felt to have diminishing return, Medical therapy was recommended. -Continue aspirin, metoprolol -ACE contraindicated given BP -Plavix and statin can be started, although the benefits of secondary prevention are diminishing in light of his overall failure to thrive  Failure to thrive At baseline, he is able to transfer to/from bed or to/from chair without assistance.  Ambulates in the home with walker independently, toilets independently, and uses a cane outside the home.  Of note, in last few months, he has spent most days in bed all day, only up a few times to take himself to the bathroom.  Today, he not only requires assistance with transfers and walking even a few feet, but he is too weak to sit up.  He appears to be failing to thrive from his cancer and NSTEMI.     Community acquired pneumonia, right upper  lobe Completed 5 days Cefepime.  Asymptomatic.  NHL Recurrence this year, restarted on chemotherapy. I spoke with Dr. Benay Spice today, he notes that the patient had been functioning fairly well, and this event has been a fairly sudden worsening in his functional status.  He is clearly not a chemo candidate at present to continue, but Dr. Benay Spice will hope to follow up with him in 1-2 weeks as an oupatietn to reconsider.  -Oncology consulted, appreciate cares  BPH -Continue FLomax      MDM and disposition: The below labs and imaging reports were reviewed and summarized above.  Medication management as above.  The case was discussed with oncology and cardiology.  The patient was admitted with a sudden decrease in functional status, and new dyspnea for about 2 weeks prior to admission.  He was worked up for pneumonia, congestive heart failure, and COPD, but ultimately was found to have elevated troponin, and new decreased ejection fraction, likely from a subacute infarct in the weeks prior to admission.  Given the time elapsed, he was not a PCI candidate, and medical management was recommended.  None the last 2 days, the patient has had an obvious decline.  He is not eating, he is starting to become delirious, and his overall condition is worsening.  He appears to have failure to thrive due to advanced age, lymphoma, and now new myocardial infarction and congestive heart failure.  We will consult palliative care for consideration of hospice referral.           DVT prophylaxis: Lovenox Code Status:  FULL Family Communication: Wife     Consultants:   Cardiology  Oncology  Procedures:   Nuclear medicine  Echocardiogram  Antimicrobials:   Cefepime    Subjective: No new sputum, fever.  Overnight confused, talking nonsense.  This morning, leaking urine.  Weakness generalized is worsening.        Objective: Vitals:   03/16/18 1939 03/17/18 0455 03/17/18 2045 03/18/18  0606  BP: 113/71 104/64 112/71 96/66  Pulse: 98 88 (!) 104 96  Resp: (!) 22 20 18 16   Temp: 97.7 F (36.5 C) 97.9 F (36.6 C) 97.9 F (36.6 C) 97.8 F (36.6 C)  TempSrc: Oral Oral Oral Oral  SpO2: 98% 96% (!) 87% (!) 86%    Intake/Output Summary (Last 24 hours) at 03/18/2018 1056 Last data filed at 03/18/2018 0700 Gross per 24 hour  Intake 600 ml  Output 100 ml  Net 500 ml   There were no vitals filed for this visit.  Examination: General appearance: Thin frail elderly male, lying in bed, looks sluggish   Skin: Warm and dry, pale.  No rashes. Cardiac: RRR, no murmurs appreciated, no LE edema, JVP normal. Respiratory: Shallow, normal rate.  No rales or wheezes. Abdomen: Abdomen soft, mild nonfocal TTP with voluntary guarding, no rebound or distension or rigidity.   Neuro: Awake and interactive, but distracted.  Strength symmetric, but can barely lift arms or legs off bed.  EOMI, speech fluent.   Psych: Responding to questions but attention diminished.  Affect blunted.  Judgment appears immpaired.       Data Reviewed: I have personally reviewed following labs and imaging studies:  CBC: Recent Labs  Lab 03/11/18 1843 03/14/18 0450  WBC 16.6* 18.3*  HGB 10.3* 10.2*  HCT 32.8* 32.3*  MCV 87.9 89.0  PLT 405* 643   Basic Metabolic Panel: Recent Labs  Lab 03/11/18 1843 03/13/18 0515 03/14/18 0450  NA 137  --  137  K 4.2  --  4.1  CL 106  --  106  CO2 21*  --  23  GLUCOSE 116*  --  131*  BUN 29*  --  25*  CREATININE 1.11 1.04 0.90  CALCIUM 8.6*  --  8.6*   GFR: Estimated Creatinine Clearance: 54.1 mL/min (by C-G formula based on SCr of 0.9 mg/dL). Liver Function Tests: Recent Labs  Lab 03/11/18 1843  AST 37  ALT 21  ALKPHOS 339*  BILITOT 0.5  PROT 6.3*  ALBUMIN 3.0*   No results for input(s): LIPASE, AMYLASE in the last 168 hours. No results for input(s): AMMONIA in the last 168 hours. Coagulation Profile: No results for input(s): INR, PROTIME in the  last 168 hours. Cardiac Enzymes: Recent Labs  Lab 03/13/18 0840 03/13/18 1345 03/13/18 2042  TROPONINI 0.25* 0.23* 0.27*   BNP (last 3 results) No results for input(s): PROBNP in the last 8760 hours. HbA1C: No results for input(s): HGBA1C in the last 72 hours. CBG: No results for input(s): GLUCAP in the last 168 hours. Lipid Profile: No results for input(s): CHOL, HDL, LDLCALC, TRIG, CHOLHDL, LDLDIRECT in the last 72 hours. Thyroid Function Tests: No results for input(s): TSH, T4TOTAL, FREET4, T3FREE, THYROIDAB in the last 72 hours. Anemia Panel: No results for input(s): VITAMINB12, FOLATE, FERRITIN, TIBC, IRON, RETICCTPCT in the last 72 hours. Urine analysis:    Component Value Date/Time   COLORURINE YELLOW 03/11/2018 1725   APPEARANCEUR TURBID (A) 03/11/2018 1725   LABSPEC 1.015 03/11/2018 1725   PHURINE 6.0 03/11/2018 1725  GLUCOSEU NEGATIVE 03/11/2018 1725   HGBUR MODERATE (A) 03/11/2018 [place] 03/11/2018 1725   KETONESUR NEGATIVE 03/11/2018 1725   PROTEINUR 100 (A) 03/11/2018 1725   UROBILINOGEN 0.2 09/22/2013 1106   NITRITE NEGATIVE 03/11/2018 1725   LEUKOCYTESUR LARGE (A) 03/11/2018 1725   Sepsis Labs: @LABRCNTIP (procalcitonin:4,lacticacidven:4)  ) Recent Results (from the past 240 hour(s))  Urine Culture     Status: Abnormal   Collection Time: 03/11/18  5:25 PM  Result Value Ref Range Status   Specimen Description   Final    URINE, CLEAN CATCH Performed at [facility], [place], [place] [address], [place], [place] [address]    Special Requests   Final    NONE Performed at [facility], [place] [address], [place], [place] [address]    Culture >=100,000 COLONIES/mL YEAST (A)  Final   Report Status 03/14/2018 FINAL  Final  Respiratory Panel by PCR     Status: None   Collection Time: 03/12/18  1:00 PM  Result Value Ref Range Status   Adenovirus NOT DETECTED NOT DETECTED Final   Coronavirus 229E NOT DETECTED  NOT DETECTED Final   Coronavirus HKU1 NOT DETECTED NOT DETECTED Final   Coronavirus NL63 NOT DETECTED NOT DETECTED Final   Coronavirus OC43 NOT DETECTED NOT DETECTED Final   Metapneumovirus NOT DETECTED NOT DETECTED Final   Rhinovirus / Enterovirus NOT DETECTED NOT DETECTED Final   Influenza A NOT DETECTED NOT DETECTED Final   Influenza B NOT DETECTED NOT DETECTED Final   Parainfluenza Virus 1 NOT DETECTED NOT DETECTED Final   Parainfluenza Virus 2 NOT DETECTED NOT DETECTED Final   Parainfluenza Virus 3 NOT DETECTED NOT DETECTED Final   Parainfluenza Virus 4 NOT DETECTED NOT DETECTED Final   Respiratory Syncytial Virus NOT DETECTED NOT DETECTED Final   Bordetella pertussis NOT DETECTED NOT DETECTED Final   Chlamydophila pneumoniae NOT DETECTED NOT DETECTED Final   Mycoplasma pneumoniae NOT DETECTED NOT DETECTED Final    Comment: Performed at [facility], [place] [address] [place], [place] [address]  MRSA PCR Screening     Status: None   Collection Time: 03/12/18  1:00 PM  Result Value Ref Range Status   MRSA by PCR NEGATIVE NEGATIVE Final    Comment:        The GeneXpert MRSA Assay (FDA approved for NASAL specimens only), is one component of a comprehensive MRSA colonization surveillance program. It is not intended to diagnose MRSA infection nor to guide or monitor treatment for MRSA infections. Performed at [facility], [place] [address] [place], [place] [address]   Culture, blood (routine x 2)     Status: None   Collection Time: 03/12/18  2:12 PM  Result Value Ref Range Status   Specimen Description   Final    BLOOD LEFT ARM Performed at [facility] [address] [place], [place] [address]    Special Requests   Final    BOTTLES DRAWN AEROBIC AND ANAEROBIC Blood Culture adequate volume Performed at [place] [address], [place], [place] [address]    Culture   Final    NO GROWTH 5 DAYS Performed at  [facility], [name] [address] [place],  [address]    Report Status 03/17/2018 FINAL  Final  Culture, blood (routine x 2)     Status: None   Collection Time: 03/12/18  2:12 PM  Result Value Ref Range Status   Specimen  Description   Final    BLOOD RIGHT ARM Performed at [place] [address], [place], [place] [address]    Special Requests   Final    BOTTLES DRAWN AEROBIC AND ANAEROBIC Blood Culture adequate volume Performed at [facility] [address], [place], [place] [address]    Culture   Final    NO GROWTH 5 DAYS Performed at [facility], [place] [address], [place], [place] [address]    Report Status 03/17/2018 FINAL  Final         Radiology Studies: No results found.      Scheduled Meds: . aspirin EC  81 mg Oral Daily  . enoxaparin (LOVENOX) injection  40 mg Subcutaneous Q24H  . lactose free nutrition  237 mL Oral TID WC  . metoprolol succinate  50 mg Oral Daily  . sodium chloride flush  10-40 mL Intracatheter Q12H  . tamsulosin  0.4 mg Oral QHS   Continuous Infusions: . sodium chloride 250 mL (03/14/18 1313)     LOS: 6 days    Time spent: 35 minutes    Edwin Dada, MD [facility] 03/18/2018, 10:56 AM     Pager [phone] --- please page though AMION:  [email or web address] Password [email or web address] If 7PM-7AM, please contact night-coverage

## 2018-03-18 NOTE — Progress Notes (Signed)
Nutrition Follow-up  DOCUMENTATION CODES:   Severe malnutrition in context of chronic illness  INTERVENTION:   Boost Plus TID chocolate- Each supplement provides 360kcal and 14g protein.    NUTRITION DIAGNOSIS:   Severe Malnutrition related to chronic illness, cancer and cancer related treatments as evidenced by energy intake < or equal to 75% for > or equal to 1 month, percent weight loss, moderate fat depletion, severe muscle depletion.  Ongoing  GOAL:   Patient will meet greater than or equal to 90% of their needs  Not meeting  MONITOR:   Supplement acceptance, PO intake, Weight trends, Labs, I & O's  REASON FOR ASSESSMENT:   Malnutrition Screening Tool    ASSESSMENT:   Patient with PMH significant for non-hodgkin lymphoma (on chemotherapy), HLD, MI, and CAD s/p CABG. Presents this admission with dyspnea on exertion and RUL PNA.    Pt with new delirium. He was able to answer some RD questions but wife had to step in. Meal completions charted as 50-100% for his last 7 meals. Food options look to be soups and broths. RD observed lunch tray untouched with soup and juice. Wife reports intake has declined over the last day. Pt able to drink one Boost daily. Encouraged PO intake and increasing Boost intake if possible.   Palliative to meet with pt due to obvious decline. Will monitor for goals of care.   A recent wt has not been taken since admission. Recommend obtaining daily weights to monitor trends.   Medications reviewed.  Labs reviewed.   Diet Order:   Diet Order            Diet - low sodium heart healthy        Diet regular Room service appropriate? Yes; Fluid consistency: Thin  Diet effective now              EDUCATION NEEDS:   Education needs have been addressed  Skin:  Skin Assessment: Reviewed RN Assessment  Last BM:  03/17/18  Height:   Ht Readings from Last 1 Encounters:  03/11/18 5\' 6"  (1.676 m)    Weight:   Wt Readings from Last 1  Encounters:  03/11/18 57.5 kg    Ideal Body Weight:  64.5 kg  BMI:  There is no height or weight on file to calculate BMI.  Estimated Nutritional Needs:   Kcal:  1750-1950 kcal  Protein:  85-100 grams   Fluid:  >/= 1.7 L/day   Mariana Single RD, LDN Clinical Nutrition Pager # - [phone]

## 2018-03-18 NOTE — Progress Notes (Signed)
CSW received confirmation that patient's insurance authorization has finalized. CSW updated attending physician. Attending physician prefers to monitor and observe patient overnight to finalize a discharge plan.  CSW and physician updated [facility], the facility will be able to accept the patient tomorrow if the discharge plan for SNF is appropriate.  CSW and physician briefly spoke with patient and spouse at bedside, they are aware of plan to monitor patient overnight and re-evaluate tomorrow.  >>>If patient is to discharge to [facility] tomorrow, patient will transport via [place].  RN CALL FOR REPORT: [phone]  Patient going to room [place], [place] Worker [phone]

## 2018-03-18 NOTE — Progress Notes (Addendum)
Patient's insurance authorization is pending. CSW following patient for discharge needs.  1:30pm Insurance auth still pending.  Stephanie Acre, [place] Social Worker [phone]

## 2018-03-18 NOTE — Progress Notes (Signed)
PT Cancellation Note  Patient Details Name: David Little MRN: [id number] DOB: 1936-04-07   Cancelled Treatment:    Reason Eval/Treat Not Completed: Fatigue/lethargy limiting ability to participate(pt reported he's fatigued from having a bath earlier today and is too tired to do any PT at present. Will follow. )   Philomena Doheny PT 03/18/2018  Acute Rehabilitation Services Pager [phone] Office [phone]

## 2018-03-19 DIAGNOSIS — N19 Unspecified kidney failure: Secondary | ICD-10-CM | POA: Diagnosis not present

## 2018-03-19 DIAGNOSIS — J189 Pneumonia, unspecified organism: Secondary | ICD-10-CM | POA: Diagnosis not present

## 2018-03-19 DIAGNOSIS — R0609 Other forms of dyspnea: Secondary | ICD-10-CM | POA: Diagnosis not present

## 2018-03-19 DIAGNOSIS — E43 Unspecified severe protein-calorie malnutrition: Secondary | ICD-10-CM | POA: Diagnosis not present

## 2018-03-19 DIAGNOSIS — N390 Urinary tract infection, site not specified: Secondary | ICD-10-CM | POA: Diagnosis not present

## 2018-03-19 DIAGNOSIS — R1312 Dysphagia, oropharyngeal phase: Secondary | ICD-10-CM | POA: Diagnosis not present

## 2018-03-19 DIAGNOSIS — R0902 Hypoxemia: Secondary | ICD-10-CM | POA: Diagnosis not present

## 2018-03-19 DIAGNOSIS — J984 Other disorders of lung: Secondary | ICD-10-CM | POA: Diagnosis not present

## 2018-03-19 DIAGNOSIS — E785 Hyperlipidemia, unspecified: Secondary | ICD-10-CM | POA: Diagnosis not present

## 2018-03-19 DIAGNOSIS — R262 Difficulty in walking, not elsewhere classified: Secondary | ICD-10-CM | POA: Diagnosis not present

## 2018-03-19 DIAGNOSIS — M6281 Muscle weakness (generalized): Secondary | ICD-10-CM | POA: Diagnosis not present

## 2018-03-19 DIAGNOSIS — I5032 Chronic diastolic (congestive) heart failure: Secondary | ICD-10-CM | POA: Diagnosis not present

## 2018-03-19 DIAGNOSIS — I214 Non-ST elevation (NSTEMI) myocardial infarction: Secondary | ICD-10-CM | POA: Diagnosis not present

## 2018-03-19 DIAGNOSIS — A419 Sepsis, unspecified organism: Secondary | ICD-10-CM | POA: Diagnosis not present

## 2018-03-19 DIAGNOSIS — E860 Dehydration: Secondary | ICD-10-CM | POA: Diagnosis not present

## 2018-03-19 DIAGNOSIS — R590 Localized enlarged lymph nodes: Secondary | ICD-10-CM | POA: Diagnosis not present

## 2018-03-19 DIAGNOSIS — C8590 Non-Hodgkin lymphoma, unspecified, unspecified site: Secondary | ICD-10-CM | POA: Diagnosis not present

## 2018-03-19 DIAGNOSIS — R55 Syncope and collapse: Secondary | ICD-10-CM | POA: Diagnosis not present

## 2018-03-19 DIAGNOSIS — N323 Diverticulum of bladder: Secondary | ICD-10-CM | POA: Diagnosis not present

## 2018-03-19 DIAGNOSIS — R410 Disorientation, unspecified: Secondary | ICD-10-CM | POA: Diagnosis not present

## 2018-03-19 DIAGNOSIS — J181 Lobar pneumonia, unspecified organism: Secondary | ICD-10-CM | POA: Diagnosis not present

## 2018-03-19 DIAGNOSIS — I2119 ST elevation (STEMI) myocardial infarction involving other coronary artery of inferior wall: Secondary | ICD-10-CM | POA: Diagnosis not present

## 2018-03-19 DIAGNOSIS — R000 Tachycardia, unspecified: Secondary | ICD-10-CM | POA: Diagnosis not present

## 2018-03-19 DIAGNOSIS — I10 Essential (primary) hypertension: Secondary | ICD-10-CM | POA: Diagnosis not present

## 2018-03-19 MED ORDER — ATORVASTATIN CALCIUM 40 MG PO TABS: 40.0000 mg | ORAL_TABLET | Freq: Every day | ORAL | 2 refills | Status: AC

## 2018-03-19 MED ORDER — ATORVASTATIN CALCIUM 40 MG PO TABS: 40.0000 mg | ORAL_TABLET | Freq: Every day | ORAL | Status: DC

## 2018-03-19 MED ORDER — CLOPIDOGREL BISULFATE 75 MG PO TABS
75.0000 mg | ORAL_TABLET | Freq: Every day | ORAL | Status: DC
  Administered 2018-03-19: 75 mg via ORAL
  Filled 2018-03-19: qty 1

## 2018-03-19 MED ORDER — CLOPIDOGREL BISULFATE 75 MG PO TABS: 75.0000 mg | ORAL_TABLET | Freq: Every day | ORAL | 2 refills | Status: AC

## 2018-03-19 NOTE — Progress Notes (Signed)
Report called to [facility] called to transport pt.

## 2018-03-19 NOTE — Clinical Social Work Placement (Signed)
Patient discharging to [facility] room 108.  CSW confirmed bed with facility, insurance auth, and faxed appropriate documents. [facility] has been called for transport.  RN call report: [phone]  CLINICAL SOCIAL WORK PLACEMENT  NOTE  Date:  03/19/2018  Patient Details  Name: David Little MRN: [id number] Date of Birth: [birth year]  Clinical Social Work is seeking post-discharge placement for this patient at the [place] level of care (*CSW will initial, date and re-position this form in  chart as items are completed):  Yes   Patient/family provided with [place] Work Department's list of facilities offering this level of care within the geographic area requested by the patient (or if unable, by the patient's family).  Yes   Patient/family informed of their freedom to choose among providers that offer the needed level of care, that participate in Medicare, Medicaid or managed care program needed by the patient, have an available bed and are willing to accept the patient.      Patient/family informed of [place]'s ownership interest in [facility] and [facility], as well as of the fact that they are under no obligation to receive care at these facilities.  PASRR submitted to EDS on       PASRR number received on 03/16/18     Existing PASRR number confirmed on       FL2 transmitted to all facilities in geographic area requested by pt/family on       FL2 transmitted to all facilities within larger geographic area on 03/16/18     Patient informed that his/her managed care company has contracts with or will negotiate with certain facilities, including the following:        Yes   Patient/family informed of bed offers received.  Patient chooses bed at [facility]     Physician recommends and patient chooses bed at      Patient to be transferred to [facility] on 03/19/18.  Patient to be transferred to facility by  PT[facility]     Patient family notified on 03/19/18 of transfer.  Name of family member notified:  Patient      PHYSICIAN Please prepare priority discharge summary, including medications     Additional Comment:    _______________________________________________ Pricilla Holm, [place] 03/19/2018, 11:51 AM

## 2018-03-19 NOTE — Discharge Summary (Signed)
Physician Discharge Summary  David Little DPO:[id number] DOB: 09-02-1935 DOA: 03/11/2018  PCP: Chipper Herb, MD  Admit date: 03/11/2018 Discharge date: 03/19/2018  Admitted From: Home  Disposition:  SNF   Recommendations for Outpatient Follow-up:  1. If patient's nutritional status, cognition or function decline, please consult Hospice for evaluation 2. Please obtain BMP/CBC in one week    Home Health: N/A  Equipment/Devices: None  Discharge Condition: Poor prognosis CODE STATUS: DO NOT RESUSCITATE Diet recommendation: Cardiac  Brief/Interim Summary: David Little is a 82 y.o. M with Non-hodgkins lymphoma, CAD s/[place] CABG in 2007 who presented to Oncology clinic with progressive dyspnea despite treatment for pneumonia, weakness, and falls, was direct admitted for DOE.  Prior to October, the patient was functioning relatively well (still doing repairs around house, independent with ADLs, mobile).  Starting at the beginning of October, the patient began to develop progressive dyspnea.  At some point, wife is unsure when, first mention in Oncology notes puts it around first week of October, he started to develop upper back and arm pain, treated with analgesics.  His dyspnea on exertion progressed however, until end of Oct, he was treated for pneumonia.  When this failed to improve symptoms, he was admitted.       PRINCIPAL HOSPITAL DIAGNOSIS: NSTEMI    Discharge Diagnoses:   NSTEMI Never chest pain, only dyspnea on exertion, fatigue.  Incidental troponin elevation noted, and follow up echocardiogram showed new reduced EF 45-50% with RWMA.  Cardiology were consulted, and nuclear stress showed new nonreversible territory.  LHC was not offered.    He was started on new aspirin, Plavix, atorvastatin, and metoprolol was changed to long acting formulation. BP would not support ARB.  Cardiology did not recommend Cardiac rehab.    New delirium The patient had intermittent  confusion prior to discharge.  This was likely precipitated by his NSTEMI, and I fear, failure to thrive.  In the hospital, he was able to walk only 10-20 feet at a time at most, and his oral intake was poor.  If he has decreasing PO intake, worsening cognition, or worsening functional status despite Rehab, please consult Hospice.  Hypertension Blood pressure soft.  Metoprolol changed to Toprol XL.  Failure to thrive At baseline, he was able to transfer to/from bed or to/from chair without assistance before October.  Ambulated in the home with walker independently, toilets independently, and uses a cane outside the home.  Of note, in last month, he has spent most days in bed all day, only up a few times to take himself to the bathroom.  Family meeting on day of discharge, and this trajectory of his current disease was reviewed.  Hospice services briefly outlined.  CODE STATUS of DNR reviewed and patient and wife agreed.  Community acquired pneumonia, right upper lobe Completed 5 days Cefepime.  Asymptomatic.  NHL Recurrence this year, restarted on chemotherapy.  He is clearly not a chemo candidate at the time of discharge, but Dr. Benay Spice will hope to follow up with him in 1-2 weeks as an oupatietn to reconsider.   BPH        Discharge Instructions  Discharge Instructions    Diet - low sodium heart healthy   Complete by:  As directed    Discharge instructions   Complete by:  As directed    From Dr. Loleta Books: You were admitted for fatigue and feeling weak and with trouble breathing. We found that this was from a heart attack. To support  the heart as best we can, it is important that you take the following medicines: Aspirin 81 mg daily Plavix 75 mg daily for 6 months Atorvastatin 40 mg nightly Metoprolol XL 50 mg daily   Call your primary doctor for a follow up appointment within 1-2 weeks after you leave Rehab.  If you haven't heard from Dr. Gearldine Shown office in a week  regarding follow up, call Dr. Gearldine Shown office.   Increase activity slowly   Complete by:  As directed      Allergies as of 03/19/2018   No Known Allergies     Medication List    STOP taking these medications   doxycycline 100 MG tablet Commonly known as:  VIBRA-TABS   levofloxacin 750 MG tablet Commonly known as:  LEVAQUIN   metoprolol tartrate 25 MG tablet Commonly known as:  LOPRESSOR   nitroGLYCERIN 0.4 MG SL tablet Commonly known as:  NITROSTAT     TAKE these medications   acetaminophen 500 MG tablet Commonly known as:  TYLENOL Take 1,000 mg by mouth every 6 (six) hours as needed for mild pain or moderate pain.   aspirin EC 81 MG tablet Take 1 tablet (81 mg total) by mouth daily.   atorvastatin 40 MG tablet Commonly known as:  LIPITOR Take 1 tablet (40 mg total) by mouth daily at 6 PM.   clopidogrel 75 MG tablet Commonly known as:  PLAVIX Take 1 tablet (75 mg total) by mouth daily. Start taking on:  03/20/2018   metoprolol succinate 50 MG 24 hr tablet Commonly known as:  TOPROL-XL Take 1 tablet (50 mg total) by mouth daily. Take with or immediately following a meal.   ondansetron 8 MG disintegrating tablet Commonly known as:  ZOFRAN-ODT Take 1 tablet (8 mg total) by mouth every 8 (eight) hours as needed for nausea or vomiting.   prochlorperazine 10 MG tablet Commonly known as:  COMPAZINE Take 1 tablet (10 mg total) by mouth every 6 (six) hours as needed for nausea or vomiting.   SYSTANE OP Apply 1 drop to eye daily as needed (dry eyes).   tamsulosin 0.4 MG Caps capsule Commonly known as:  FLOMAX Take 0.4 mg by mouth at bedtime.   traMADol 50 MG tablet Commonly known as:  ULTRAM Take 1 tablet (50 mg total) by mouth every 6 (six) hours as needed.      Follow-up Information    Minus Breeding, MD. Schedule an appointment as soon as possible for a visit in 2 week(s).   Specialty:  Cardiology Contact information: [address] [place] [address] [phone]          No Known Allergies  Consultations:  Oncology  Cardiology     Procedures/Studies: Dg Chest 2 View  Result Date: 03/11/2018 CLINICAL DATA:  Dyspnea on exertion, shortness of breath. History of lymphoma. EXAM: CHEST - 2 VIEW COMPARISON:  CT chest March 05, 2018 FINDINGS: Cardiomediastinal silhouette is normal. Status post median sternotomy. Mildly calcified aortic arch. No pleural effusions or focal consolidations. Mild chronic interstitial changes. Trachea projects midline and there is no pneumothorax. Soft tissue planes and included osseous structures are non-suspicious. Single-lumen RIGHT chest Port-A-Cath in situ. IMPRESSION: 1. Mild chronic interstitial changes without focal consolidation. 2.  Aortic Atherosclerosis (ICD10-I70.0). Electronically Signed   By: Elon Alas M.D.   On: 03/11/2018 22:42   Ct Angio Chest Pe W Or Wo Contrast  Result Date: 03/05/2018 CLINICAL DATA:  Dyspnea on exertion. EXAM: CT ANGIOGRAPHY CHEST  WITH CONTRAST TECHNIQUE: Multidetector CT imaging of the chest was performed using the standard protocol during bolus administration of intravenous contrast. Multiplanar CT image reconstructions and MIPs were obtained to evaluate the vascular anatomy. CONTRAST:  26mL ISOVUE-370 IOPAMIDOL (ISOVUE-370) INJECTION 76% COMPARISON:  CT scan of December 24, 2017. FINDINGS: Cardiovascular: Satisfactory opacification of the pulmonary arteries to the segmental level. No evidence of pulmonary embolism. Normal heart size. No pericardial effusion. Status post coronary artery bypass graft. Mediastinum/Nodes: No enlarged mediastinal, hilar, or axillary lymph nodes. Thyroid gland, trachea, and esophagus demonstrate no significant findings. Lungs/Pleura: No pneumothorax or pleural effusion is noted. Right upper lobe airspace opacity is noted consistent with pneumonia. Cluster of nodules noted in the right lower lobe posteriorly most  consistent with atypical inflammation. Left lung is unremarkable. Upper Abdomen: Visualized portion of retroperitoneal mass appears to be decreased in size compared to prior exam. Musculoskeletal: No chest wall abnormality. No acute or significant osseous findings. Review of the MIP images confirms the above findings. IMPRESSION: No definite evidence of pulmonary embolus. Right upper lobe airspace opacity is noted consistent with pneumonia. Cluster of nodules is seen posteriorly in right lower lobe most consistent with atypical inflammation. Visualized portion of retroperitoneal mass appears to be decreased in size compared to prior exam. Electronically Signed   By: Marijo Conception, M.D.   On: 03/05/2018 15:40   Nm Myocar Multi W/spect W/wall Motion / Ef  Result Date: 03/14/2018 CLINICAL DATA:  Previous CABG.  Evaluate graft patency. EXAM: MYOCARDIAL IMAGING WITH SPECT (REST AND PHARMACOLOGIC-STRESS) GATED LEFT VENTRICULAR WALL MOTION STUDY LEFT VENTRICULAR EJECTION FRACTION TECHNIQUE: Standard myocardial SPECT imaging was performed after resting intravenous injection of 10 mCi Tc-15m tetrofosmin. Subsequently, intravenous infusion of Lexiscan was performed under the supervision of the Cardiology staff. At peak effect of the drug, 30 mCi Tc-29m tetrofosmin was injected intravenously and standard myocardial SPECT imaging was performed. Quantitative gated imaging was also performed to evaluate left ventricular wall motion, and estimate left ventricular ejection fraction. COMPARISON:  Two-view chest x-ray 03/11/2018 FINDINGS: Perfusion: A large segment of decreased uptake is present in the septum and inferior wall on both rest and stress images. There is no inducible ischemia. Wall Motion: There is marked hypokinesis involving the septum and inferior wall. Normal motion is present in the anterior and lateral walls. Left Ventricular Ejection Fraction: 37 % End diastolic volume 79 ml End systolic volume 50 ml  IMPRESSION: 1. Large fixed defect compatible with infarct in the septum and inferior wall. No inducible ischemia is present. 2. Marked hypokinesis within the infarct territory of the septum and inferior wall. Wall motion is otherwise normal. 3. Left ventricular ejection fraction 37% 4. Non invasive risk stratification*: Intermediate *2012 Appropriate Use Criteria for Coronary Revascularization Focused Update: J Am Coll Cardiol. 9518;84(1):660-630. http://content.airportbarriers.com.aspx?articleid=[email or web address] Electronically Signed   By: San Morelle M.D.   On: 03/14/2018 14:12       Subjective: No chest pain, no dyspnea at rest.  Globally weak, unchanged.  No confusion.  Ate good breakfast today.  Walked 20 feet with PT this mroning.  Sat in chair for a wehile.    Discharge Exam: Vitals:   03/19/18 0700 03/19/18 0800  BP:    Pulse: (!) 102 68  Resp:  16  Temp:    SpO2:     Vitals:   03/19/18 0425 03/19/18 0538 03/19/18 0700 03/19/18 0800  BP:  101/65    Pulse: 64 (!) 119 (!) 102 68  Resp:    16  Temp:      TempSrc:      SpO2:  99%      General: Pt is alert, awake, not in acute distress, lying in bed, appears listless Cardiovascular: Tachycardic, regular, nl S1-S2, no murmurs appreciated.   No LE edema.   Respiratory: Normal respiratory rate and rhythm.  CTAB without rales or wheezes. Abdominal: Abdomen soft and non-tender.  No distension or HSM.   Neuro/Psych: He is globally extremely weak.  He is oriented to person, place and time.    Judgment and insight appear normal.   The results of significant diagnostics from this hospitalization (including imaging, microbiology, ancillary and laboratory) are listed below for reference.     Microbiology: Recent Results (from the past 240 hour(s))  Urine Culture     Status: Abnormal   Collection Time: 03/11/18  5:25 PM  Result Value Ref Range Status   Specimen Description   Final    URINE, CLEAN CATCH Performed at [facility] [address] [place], [place] [address]    Special Requests   Final    NONE Performed at [facility], [place] [address] [place], [place] [address]    Culture >=100,000 COLONIES/mL YEAST (A)  Final   Report Status 03/14/2018 FINAL  Final  Respiratory Panel by PCR     Status: None   Collection Time: 03/12/18  1:00 PM  Result Value Ref Range Status   Adenovirus NOT DETECTED NOT DETECTED Final   Coronavirus 229E NOT DETECTED NOT DETECTED Final   Coronavirus HKU1 NOT DETECTED NOT DETECTED Final   Coronavirus NL63 NOT DETECTED NOT DETECTED Final   Coronavirus OC43 NOT DETECTED NOT DETECTED Final   Metapneumovirus NOT DETECTED NOT DETECTED Final   Rhinovirus / Enterovirus NOT DETECTED NOT DETECTED Final   Influenza A NOT DETECTED NOT DETECTED Final   Influenza B NOT DETECTED NOT DETECTED Final   Parainfluenza Virus 1 NOT DETECTED NOT DETECTED Final   Parainfluenza Virus 2 NOT DETECTED NOT DETECTED Final   Parainfluenza Virus 3 NOT DETECTED NOT DETECTED Final   Parainfluenza Virus 4 NOT DETECTED NOT DETECTED Final   Respiratory Syncytial Virus NOT DETECTED NOT DETECTED Final   Bordetella pertussis NOT DETECTED NOT DETECTED Final   Chlamydophila pneumoniae NOT DETECTED NOT DETECTED Final   Mycoplasma pneumoniae NOT DETECTED NOT DETECTED Final    Comment: Performed at [facility], [place] [address] [place], [place] [address]  MRSA PCR Screening     Status: None   Collection Time: 03/12/18  1:00 PM  Result Value Ref Range Status   MRSA by PCR NEGATIVE NEGATIVE Final    Comment:        The GeneXpert MRSA Assay (FDA approved for NASAL specimens only), is one component of a comprehensive MRSA colonization surveillance program. It is not intended to diagnose MRSA infection nor to guide or monitor treatment for MRSA infections. Performed at [facility], [place] [address] [place], [place] [address]   Culture, blood (routine  x 2)     Status: None   Collection Time: 03/12/18  2:12 PM  Result Value Ref Range Status   Specimen Description   Final    BLOOD LEFT ARM Performed at [facility] [address] [place],  [address]    Special Requests   Final    BOTTLES DRAWN AEROBIC AND ANAEROBIC Blood Culture adequate volume Performed at [place] [address] [place], [place]  [address]    Culture   Final    NO GROWTH 5 DAYS Performed at [facility], [place] [address], [place], [place] [address]    Report Status 03/17/2018 FINAL  Final  Culture, blood (routine x 2)     Status: None   Collection Time: 03/12/18  2:12 PM  Result Value Ref Range Status   Specimen Description   Final    BLOOD RIGHT ARM Performed at [facility] [address], [place],  [place] [address]    Special Requests   Final    BOTTLES DRAWN AEROBIC AND ANAEROBIC Blood Culture adequate volume Performed at [place] [address] [place], [place] [address]    Culture   Final    NO GROWTH 5 DAYS Performed at [facility], [place] [address], [place], [place] [address]    Report Status 03/17/2018 FINAL  Final     Labs: BNP (last 3 results) Recent Labs    03/05/18 1304 03/11/18 1843  BNP 178.0* 656.8*   Basic Metabolic Panel: Recent Labs  Lab 03/13/18 0515 03/14/18 0450  NA  --  137  K  --  4.1  CL  --  106  CO2  --  23  GLUCOSE  --  131*  BUN  --  25*  CREATININE 1.04 0.90  CALCIUM  --  8.6*   Liver Function Tests: No results for input(s): AST, ALT, ALKPHOS, BILITOT, PROT, ALBUMIN in the last 168 hours. No results for input(s): LIPASE, AMYLASE in the last 168 hours. No results for input(s): AMMONIA in the last 168 hours. CBC: Recent Labs  Lab 03/14/18 0450  WBC 18.3*  HGB 10.2*  HCT 32.3*  MCV 89.0  PLT 314   Cardiac Enzymes: Recent Labs  Lab 03/13/18 0840 03/13/18 1345 03/13/18 2042  TROPONINI 0.25*  0.23* 0.27*   BNP: Invalid input(s): POCBNP CBG: No results for input(s): GLUCAP in the last 168 hours. D-Dimer No results for input(s): DDIMER in the last 72 hours. Hgb A1c No results for input(s): HGBA1C in the last 72 hours. Lipid Profile No results for input(s): CHOL, HDL, LDLCALC, TRIG, CHOLHDL, LDLDIRECT in the last 72 hours. Thyroid function studies No results for input(s): TSH, T4TOTAL, T3FREE, THYROIDAB in the last 72 hours.  Invalid input(s): FREET3 Anemia work up No results for input(s): VITAMINB12, FOLATE, FERRITIN, TIBC, IRON, RETICCTPCT in the last 72 hours. Urinalysis    Component Value Date/Time   COLORURINE YELLOW 03/11/2018 1725   APPEARANCEUR TURBID (A) 03/11/2018 1725   LABSPEC 1.015 03/11/2018 1725   PHURINE 6.0 03/11/2018 1725   GLUCOSEU NEGATIVE 03/11/2018 1725   HGBUR MODERATE (A) 03/11/2018 1725   BILIRUBINUR NEGATIVE 03/11/2018 1725   KETONESUR NEGATIVE 03/11/2018 1725   PROTEINUR 100 (A) 03/11/2018 1725   UROBILINOGEN 0.2 09/22/2013 1106   NITRITE NEGATIVE 03/11/2018 1725   LEUKOCYTESUR LARGE (A) 03/11/2018 1725   Sepsis Labs Invalid input(s): PROCALCITONIN,  WBC,  LACTICIDVEN Microbiology Recent Results (from the past 240 hour(s))  Urine Culture     Status: Abnormal   Collection Time: 03/11/18  5:25 PM  Result Value Ref Range Status   Specimen Description   Final    URINE, CLEAN CATCH Performed at [facility], [place] [address] [place], [place] [address]    Special Requests   Final    NONE Performed at [facility], [place] [address] [place], [place] [address]    Culture >=100,000 COLONIES/mL YEAST (  A)  Final   Report Status 03/14/2018 FINAL  Final  Respiratory Panel by PCR     Status: None   Collection Time: 03/12/18  1:00 PM  Result Value Ref Range Status   Adenovirus NOT DETECTED NOT DETECTED Final   Coronavirus 229E NOT DETECTED NOT DETECTED Final   Coronavirus HKU1 NOT DETECTED NOT DETECTED  Final   Coronavirus NL63 NOT DETECTED NOT DETECTED Final   Coronavirus OC43 NOT DETECTED NOT DETECTED Final   Metapneumovirus NOT DETECTED NOT DETECTED Final   Rhinovirus / Enterovirus NOT DETECTED NOT DETECTED Final   Influenza A NOT DETECTED NOT DETECTED Final   Influenza B NOT DETECTED NOT DETECTED Final   Parainfluenza Virus 1 NOT DETECTED NOT DETECTED Final   Parainfluenza Virus 2 NOT DETECTED NOT DETECTED Final   Parainfluenza Virus 3 NOT DETECTED NOT DETECTED Final   Parainfluenza Virus 4 NOT DETECTED NOT DETECTED Final   Respiratory Syncytial Virus NOT DETECTED NOT DETECTED Final   Bordetella pertussis NOT DETECTED NOT DETECTED Final   Chlamydophila pneumoniae NOT DETECTED NOT DETECTED Final   Mycoplasma pneumoniae NOT DETECTED NOT DETECTED Final    Comment: Performed at [facility], [place] [address], [place], [place] [address]  MRSA PCR Screening     Status: None   Collection Time: 03/12/18  1:00 PM  Result Value Ref Range Status   MRSA by PCR NEGATIVE NEGATIVE Final    Comment:        The GeneXpert MRSA Assay (FDA approved for NASAL specimens only), is one component of a comprehensive MRSA colonization surveillance program. It is not intended to diagnose MRSA infection nor to guide or monitor treatment for MRSA infections. Performed at [facility], [place] [address] [place], [place] [address]   Culture, blood (routine x 2)     Status: None   Collection Time: 03/12/18  2:12 PM  Result Value Ref Range Status   Specimen Description   Final    BLOOD LEFT ARM Performed at [facility] [address], [place], [place] [address]    Special Requests   Final    BOTTLES DRAWN AEROBIC AND ANAEROBIC Blood Culture adequate volume Performed at [place] [address] [place], [place] [address]    Culture   Final    NO GROWTH 5 DAYS Performed at [facility], [place] [address] [place], [place] [address]     Report Status 03/17/2018 FINAL  Final  Culture, blood (routine x 2)     Status: None   Collection Time: 03/12/18  2:12 PM  Result Value Ref Range Status   Specimen Description   Final    BLOOD RIGHT ARM Performed at [place] [address], [place], [place] [address]    Special Requests   Final    BOTTLES DRAWN AEROBIC AND ANAEROBIC Blood Culture adequate volume Performed at [place] [address] [place], [place] [address]    Culture   Final    NO GROWTH 5 DAYS Performed at [facility], [place] [address], [place], [place] [address]    Report Status 03/17/2018 FINAL  Final     Time coordinating discharge: 50 minutes       SIGNED:   Edwin Dada, MD  [facility] 03/19/2018, 11:28 AM

## 2018-03-19 NOTE — Care Management Important Message (Signed)
Important Message  Patient Details  Name: BOOKERT GUZZI MRN: [id number] Date of Birth: June 17, 1935   Medicare Important Message Given:  Yes    Kerin Salen 03/19/2018, 10:32 AMImportant Message  Patient Details  Name: RONIN REHFELDT MRN: [id number] Date of Birth: [birth year]   Medicare Important Message Given:  Yes    Kerin Salen 03/19/2018, 10:32 AM

## 2018-03-19 NOTE — Progress Notes (Signed)
Physical Therapy Treatment Patient Details Name: David Little MRN: [id number] DOB: 07-18-1935 Today's Date: 03/19/2018    History of Present Illness David Little is a 82 y.o. male with medical history significant of non-hodgkin lymphoma, hyperlipidemia, CAD s/p CABG, L TKA. adm with Dyspnea on exertion, flu negative, possible PNA    PT Comments    Pt progressing slowly, requires strong encouragement to participate; continue to recommend SNF  Follow Up Recommendations  SNF     Equipment Recommendations  None recommended by PT    Recommendations for Other Services       Precautions / Restrictions Precautions Precautions: Fall Restrictions Weight Bearing Restrictions: No    Mobility  Bed Mobility Overal bed mobility: Needs Assistance Bed Mobility: Supine to Sit     Supine to sit: Min guard     General bed mobility comments: using bedrail and extra time  Transfers Overall transfer level: Needs assistance Equipment used: Rolling walker (2 wheeled) Transfers: Sit to/from Stand Sit to Stand: Min guard;Min assist         General transfer comment: light assist to rise and steady  Ambulation/Gait Ambulation/Gait assistance: Min assist Gait Distance (Feet): 11 Feet Assistive device: Rolling walker (2 wheeled) Gait Pattern/deviations: Step-through pattern;Decreased stride length;Wide base of support;Trunk flexed     General Gait Details: cues for trunk extension, multi-modal cues for RW position and participation   Stairs             Wheelchair Mobility    Modified Rankin (Stroke Patients Only)       Balance   Sitting-balance support: Feet supported Sitting balance-Leahy Scale: Good Sitting balance - Comments: at least fair   Standing balance support: Bilateral upper extremity supported Standing balance-Leahy Scale: Poor Standing balance comment: reliant on UE support for static stand                            Cognition  Arousal/Alertness: Awake/alert Behavior During Therapy: WFL for tasks assessed/performed Overall Cognitive Status: Impaired/Different from baseline Area of Impairment: Attention;Memory;Safety/judgement;Problem solving                   Current Attention Level: Sustained Memory: Decreased short-term memory   Safety/Judgement: Decreased awareness of safety;Decreased awareness of deficits   Problem Solving: Decreased initiation;Requires verbal cues General Comments: pt is oriented x3 however seems to be experiencing (?) visual hallucinations at times--hands me a "nail" that was not there, asks about the "2 puppies"--later denies when questioned about these things      Exercises      General Comments        Pertinent Vitals/Pain Pain Assessment: No/denies pain    Home Living                      Prior Function            PT Goals (current goals can now be found in the care plan section) Acute Rehab PT Goals Patient Stated Goal: to get home asap Time For Goal Achievement: 03/26/18 Potential to Achieve Goals: Good Progress towards PT goals: Progressing toward goals    Frequency    Min 2X/week      PT Plan Current plan remains appropriate;Frequency needs to be updated    Co-evaluation              AM-PAC PT "6 Clicks" Daily Activity  Outcome Measure  Difficulty turning over in bed (including  adjusting bedclothes, sheets and blankets)?: A Little Difficulty moving from lying on back to sitting on the side of the bed? : Unable Difficulty sitting down on and standing up from a chair with arms (e.g., wheelchair, bedside commode, etc,.)?: Unable Help needed moving to and from a bed to chair (including a wheelchair)?: A Little Help needed walking in hospital room?: A Little Help needed climbing 3-5 steps with a railing? : A Lot 6 Click Score: 13    End of Session Equipment Utilized During Treatment: Gait belt Activity Tolerance: Patient tolerated  treatment well Patient left: in chair;with call bell/phone within reach;with family/visitor present(no alarm pads on unit--staff aware) Nurse Communication: Mobility status PT Visit Diagnosis: Muscle weakness (generalized) (M62.81);Difficulty in walking, not elsewhere classified (R26.2)     Time: 8719-5974 PT Time Calculation (min) (ACUTE ONLY): 20 min  Charges:  $Gait Training: 8-22 mins                     Kenyon Ana, PT  Pager: [phone] Acute Rehab Dept [facility]): [phone]   03/19/2018    [facility] 03/19/2018, 10:35 AM

## 2018-03-20 DIAGNOSIS — C8590 Non-Hodgkin lymphoma, unspecified, unspecified site: Secondary | ICD-10-CM | POA: Diagnosis not present

## 2018-03-20 DIAGNOSIS — I214 Non-ST elevation (NSTEMI) myocardial infarction: Secondary | ICD-10-CM | POA: Diagnosis not present

## 2018-03-20 DIAGNOSIS — E43 Unspecified severe protein-calorie malnutrition: Secondary | ICD-10-CM | POA: Diagnosis not present

## 2018-03-20 DIAGNOSIS — I5032 Chronic diastolic (congestive) heart failure: Secondary | ICD-10-CM | POA: Diagnosis not present

## 2018-03-23 ENCOUNTER — Telehealth: Payer: Self-pay | Admitting: *Deleted

## 2018-03-23 DIAGNOSIS — C8590 Non-Hodgkin lymphoma, unspecified, unspecified site: Secondary | ICD-10-CM | POA: Diagnosis not present

## 2018-03-23 DIAGNOSIS — I214 Non-ST elevation (NSTEMI) myocardial infarction: Secondary | ICD-10-CM | POA: Diagnosis not present

## 2018-03-23 DIAGNOSIS — J189 Pneumonia, unspecified organism: Secondary | ICD-10-CM | POA: Diagnosis not present

## 2018-03-23 DIAGNOSIS — I10 Essential (primary) hypertension: Secondary | ICD-10-CM | POA: Diagnosis not present

## 2018-03-23 NOTE — Telephone Encounter (Signed)
Wife requesting to move his appointments from 11/14 to next week. He is not adjusting well the the rehab facility and begs to be taken home constantly. Feels if he is there without leaving for another week and rehab starts he will do better. OK per Dr. Benay Spice. Scheduling message sent to move 11/14 appointments to 04-07-2018.

## 2018-03-25 ENCOUNTER — Telehealth: Payer: Self-pay | Admitting: Oncology

## 2018-03-25 NOTE — Telephone Encounter (Signed)
Called regarding 11/20

## 2018-03-26 ENCOUNTER — Inpatient Hospital Stay: Payer: Medicare Other | Admitting: Oncology

## 2018-03-26 ENCOUNTER — Inpatient Hospital Stay: Payer: Medicare Other

## 2018-03-27 ENCOUNTER — Inpatient Hospital Stay: Payer: Medicare Other | Attending: Oncology

## 2018-03-27 DIAGNOSIS — N390 Urinary tract infection, site not specified: Secondary | ICD-10-CM | POA: Diagnosis not present

## 2018-03-27 DIAGNOSIS — I2510 Atherosclerotic heart disease of native coronary artery without angina pectoris: Secondary | ICD-10-CM | POA: Diagnosis not present

## 2018-03-27 DIAGNOSIS — Z7401 Bed confinement status: Secondary | ICD-10-CM | POA: Diagnosis not present

## 2018-03-27 DIAGNOSIS — E860 Dehydration: Secondary | ICD-10-CM | POA: Diagnosis not present

## 2018-03-27 DIAGNOSIS — K219 Gastro-esophageal reflux disease without esophagitis: Secondary | ICD-10-CM | POA: Diagnosis not present

## 2018-03-27 DIAGNOSIS — R7989 Other specified abnormal findings of blood chemistry: Secondary | ICD-10-CM | POA: Diagnosis not present

## 2018-03-27 DIAGNOSIS — A419 Sepsis, unspecified organism: Secondary | ICD-10-CM | POA: Diagnosis not present

## 2018-03-27 DIAGNOSIS — Z87891 Personal history of nicotine dependence: Secondary | ICD-10-CM | POA: Diagnosis not present

## 2018-03-27 DIAGNOSIS — R590 Localized enlarged lymph nodes: Secondary | ICD-10-CM | POA: Diagnosis not present

## 2018-03-27 DIAGNOSIS — E43 Unspecified severe protein-calorie malnutrition: Secondary | ICD-10-CM | POA: Diagnosis not present

## 2018-03-27 DIAGNOSIS — I219 Acute myocardial infarction, unspecified: Secondary | ICD-10-CM | POA: Diagnosis not present

## 2018-03-27 DIAGNOSIS — Z951 Presence of aortocoronary bypass graft: Secondary | ICD-10-CM | POA: Diagnosis not present

## 2018-03-27 DIAGNOSIS — Z66 Do not resuscitate: Secondary | ICD-10-CM | POA: Diagnosis not present

## 2018-03-27 DIAGNOSIS — J984 Other disorders of lung: Secondary | ICD-10-CM | POA: Diagnosis not present

## 2018-03-27 DIAGNOSIS — N189 Chronic kidney disease, unspecified: Secondary | ICD-10-CM | POA: Diagnosis not present

## 2018-03-27 DIAGNOSIS — N323 Diverticulum of bladder: Secondary | ICD-10-CM | POA: Diagnosis not present

## 2018-03-27 DIAGNOSIS — Z7982 Long term (current) use of aspirin: Secondary | ICD-10-CM | POA: Diagnosis not present

## 2018-03-27 DIAGNOSIS — N179 Acute kidney failure, unspecified: Secondary | ICD-10-CM | POA: Diagnosis not present

## 2018-03-27 DIAGNOSIS — R5381 Other malaise: Secondary | ICD-10-CM | POA: Diagnosis not present

## 2018-03-27 DIAGNOSIS — N19 Unspecified kidney failure: Secondary | ICD-10-CM | POA: Diagnosis not present

## 2018-03-27 DIAGNOSIS — R55 Syncope and collapse: Secondary | ICD-10-CM | POA: Diagnosis not present

## 2018-03-27 DIAGNOSIS — C8330 Diffuse large B-cell lymphoma, unspecified site: Secondary | ICD-10-CM | POA: Diagnosis not present

## 2018-03-30 ENCOUNTER — Telehealth: Payer: Self-pay | Admitting: *Deleted

## 2018-03-30 NOTE — Telephone Encounter (Signed)
Wife called to report patient was admitted to [facility] and will be coming home tomorrow with Hospice. She wants to cancel all his appointments on Wednesday. Dr. Benay Spice notified.

## 2018-04-01 ENCOUNTER — Other Ambulatory Visit: Payer: Medicare Other

## 2018-04-01 ENCOUNTER — Telehealth: Payer: Self-pay | Admitting: *Deleted

## 2018-04-01 ENCOUNTER — Ambulatory Visit: Payer: Self-pay

## 2018-04-01 ENCOUNTER — Ambulatory Visit: Payer: Medicare Other | Admitting: Oncology

## 2018-04-12 NOTE — Telephone Encounter (Signed)
Wife called to report patient died in the home this morning. Wanted to thank Dr. Benay Spice and everyone for all their care.

## 2018-04-12 DEATH — deceased

## 2018-04-23 ENCOUNTER — Ambulatory Visit: Payer: Self-pay | Admitting: Cardiology

## 2019-08-24 IMAGING — DX DG CHEST 2V
2 series · 3 of 3 positions shown · non-contrast
Comparison: Chest x-ray 09/22/2017.

CLINICAL DATA: Shortness of breath.

EXAM:
CHEST - 2 VIEW

[Series 2: chest pa · 0.14mm/px · 2 of 2 slices shown]
[im 1/2]
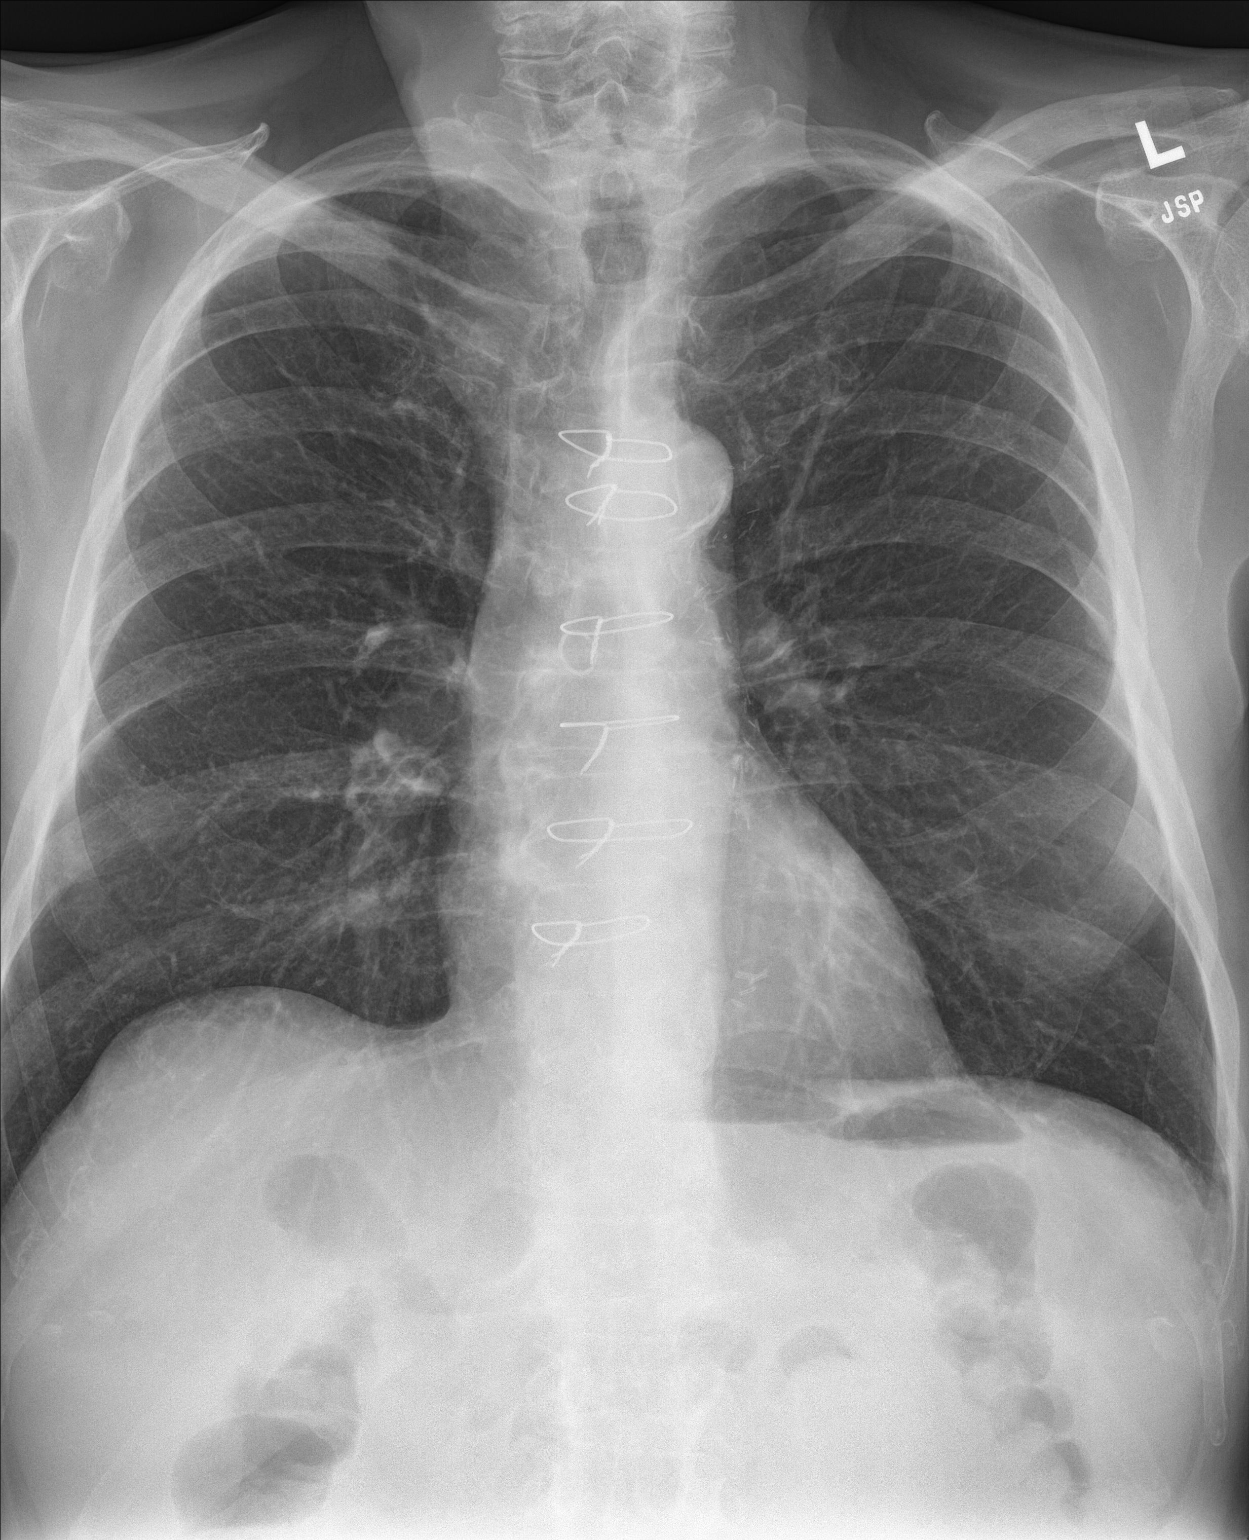
[im 2/2]
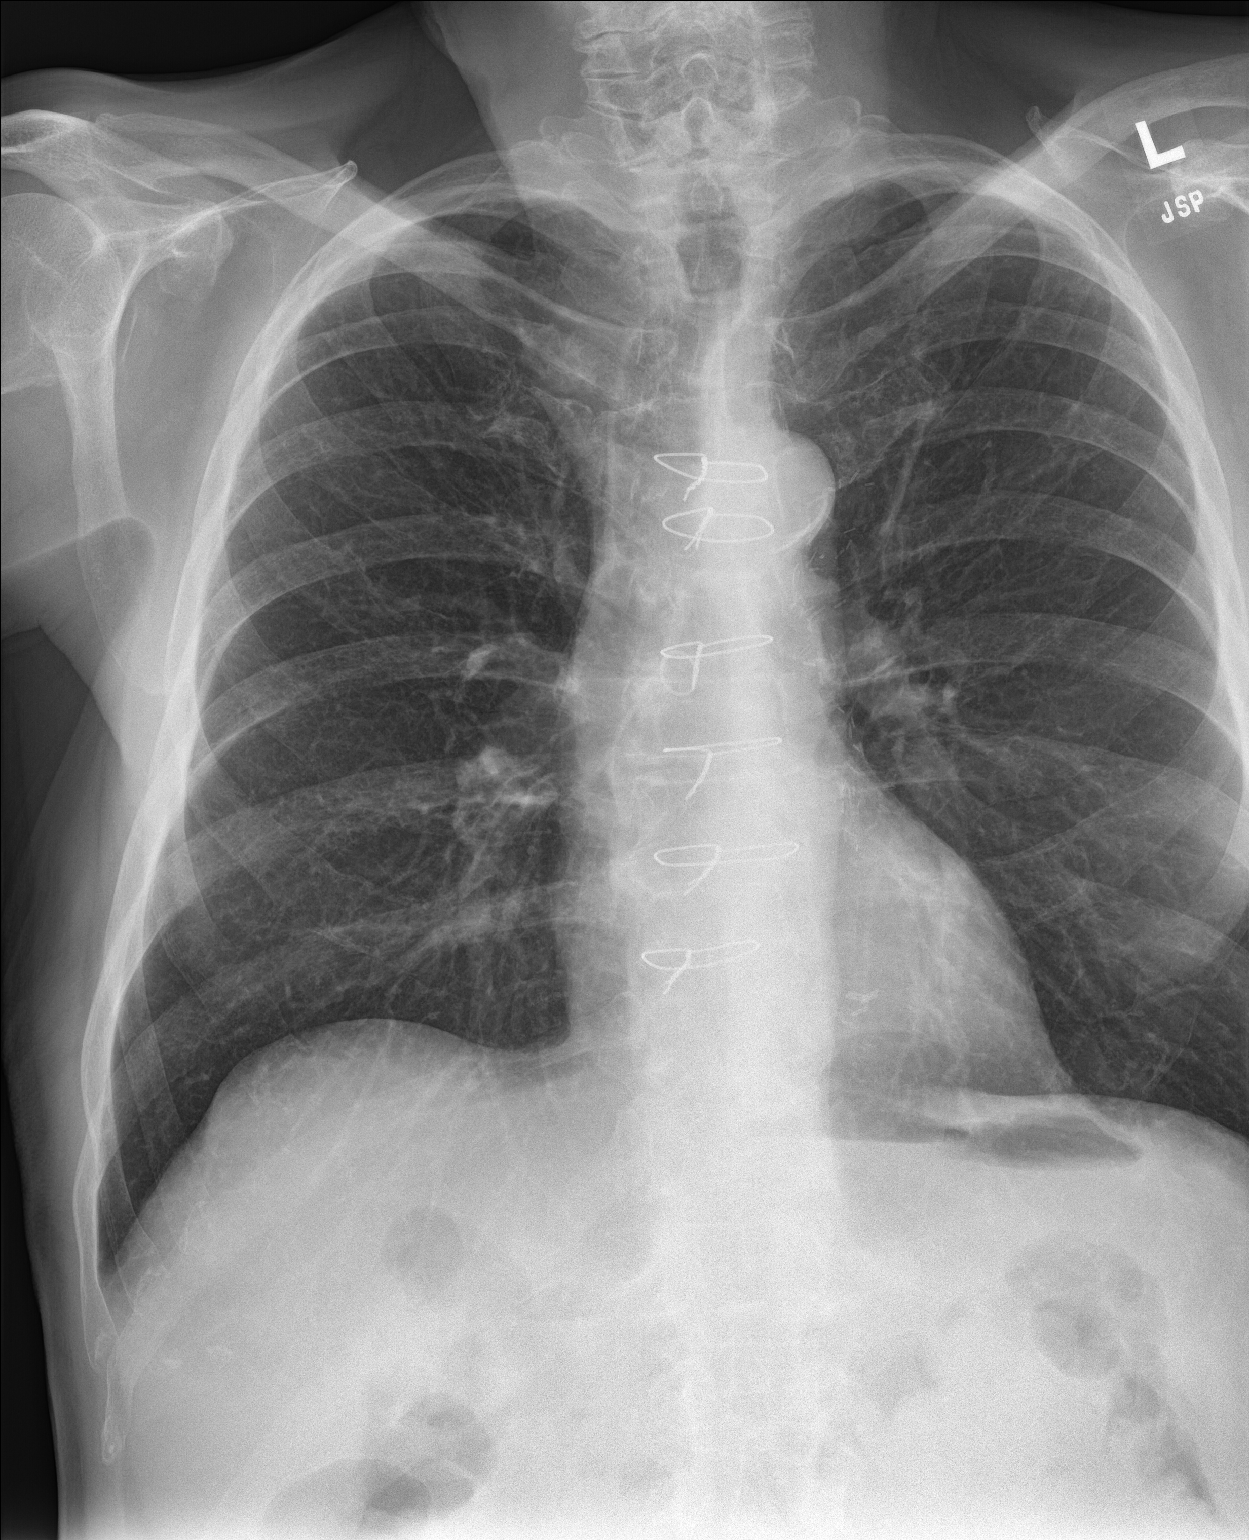

[chest lat]
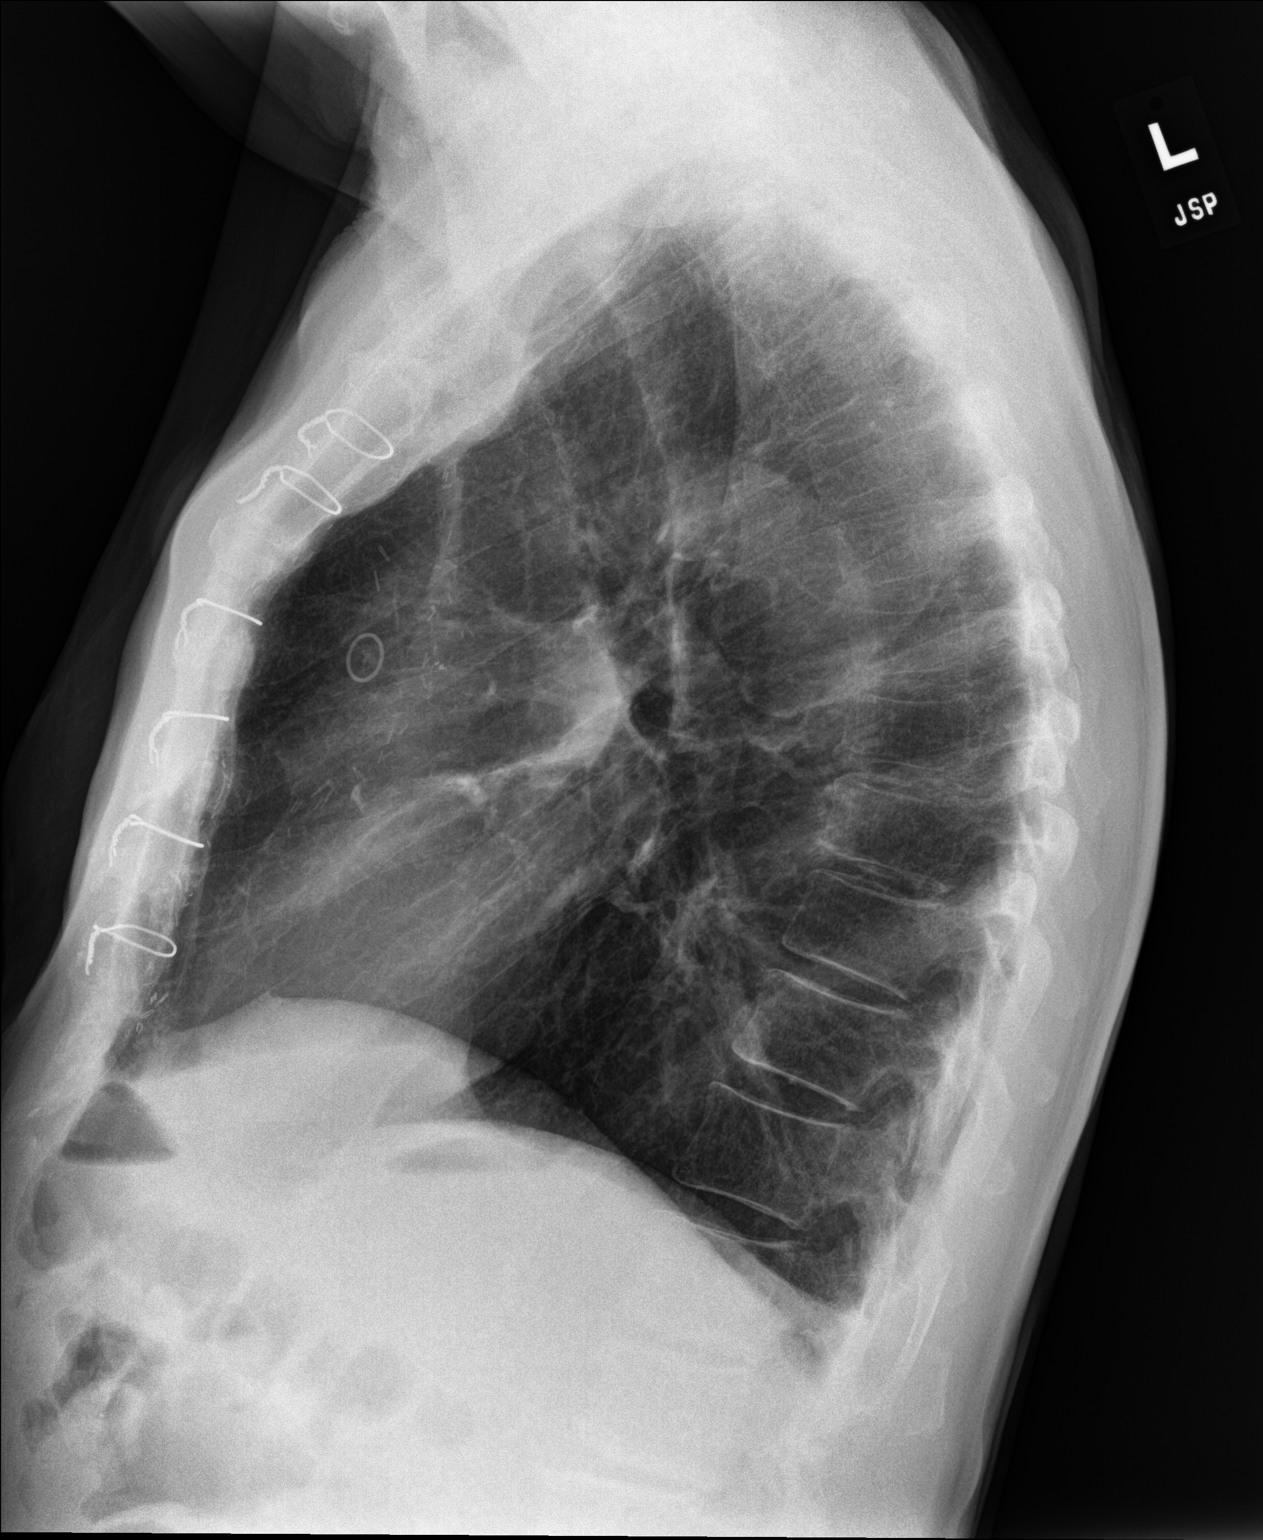

[3 of 3 positions shown; findings below may reference images not displayed]

FINDINGS: Prior CABG. Mediastinum hilar structures normal. No pleural effusion
or pneumothorax. Heart size normal. No acute bony abnormality.
IMPRESSION: 1.  Prior CABG.  Heart size normal.

2.  No acute pulmonary disease.

## 2019-09-10 IMAGING — CT CT BIOPSY
1 of 3 series · 14 of 32 positions shown, 19 images · non-contrast
Comparison: CT abdomen and pelvis - 12/24/2017

INDICATION: Remote history of lymphoma, now with retroperitoneal lymphadenopathy
worrisome for disease recurrence. Please perform CT-guided biopsy
for tissue diagnostic purposes.

EXAM:
CT-GUIDED BIOPSY OF RETROPERITONEAL NODAL CONGLOMERATION

[Series 2: i-spiral 5.0 b31f · axial · 0.98mm/px · z∈[-209,-51]mm · 14 of 53 slices shown, 19 images]
[im 4/53  soft-tissue]
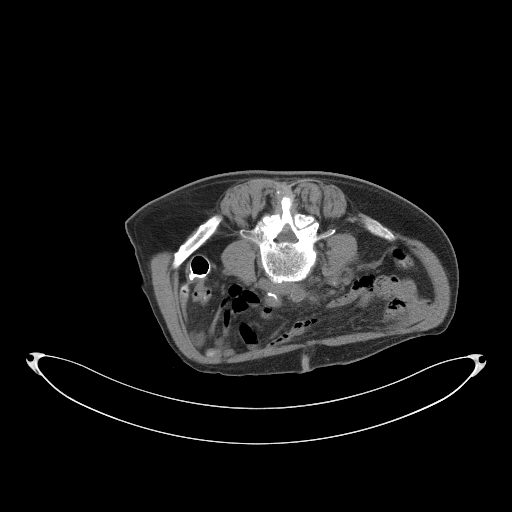
[im 4/53  bone]
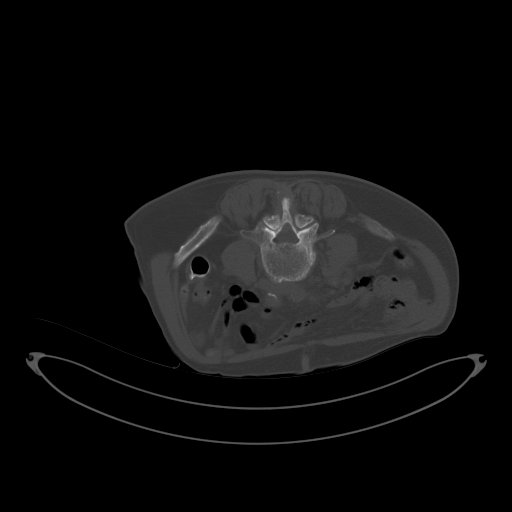
[im 7/53  soft-tissue]
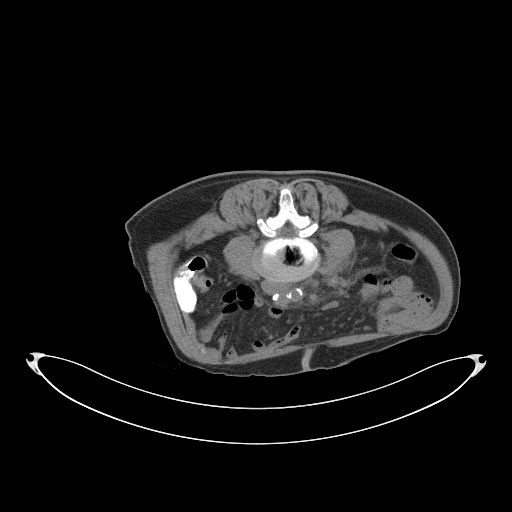
[im 10/53  soft-tissue]
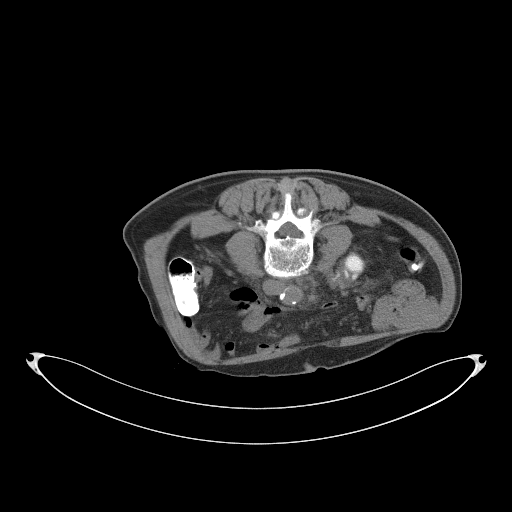
[im 17/53  soft-tissue]
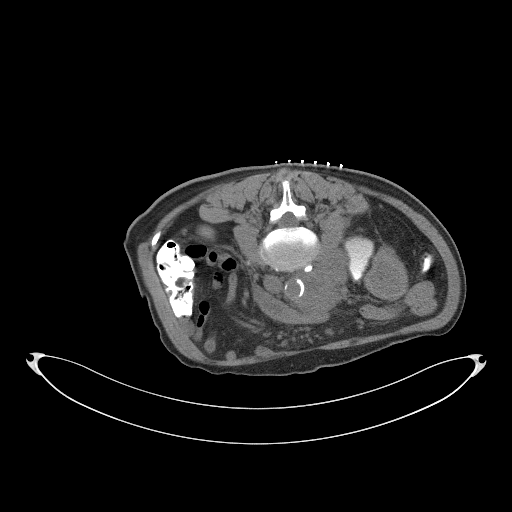
[im 20/53  soft-tissue]
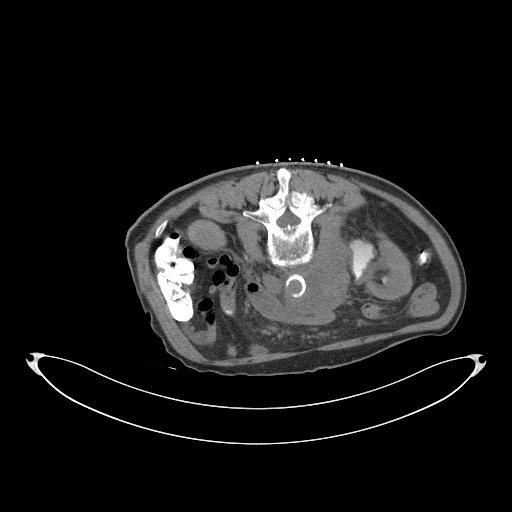
[im 23/53  soft-tissue]
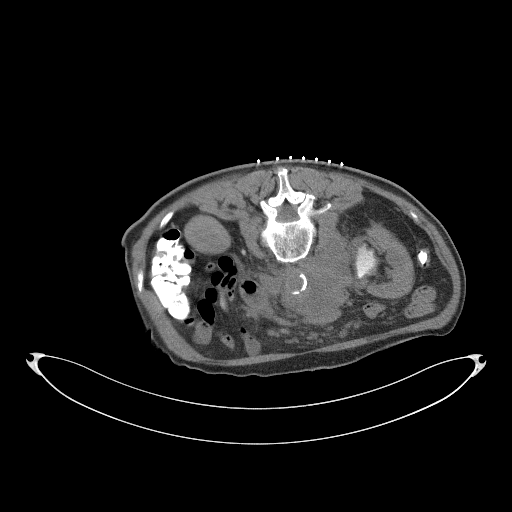
[im 27/53  soft-tissue]
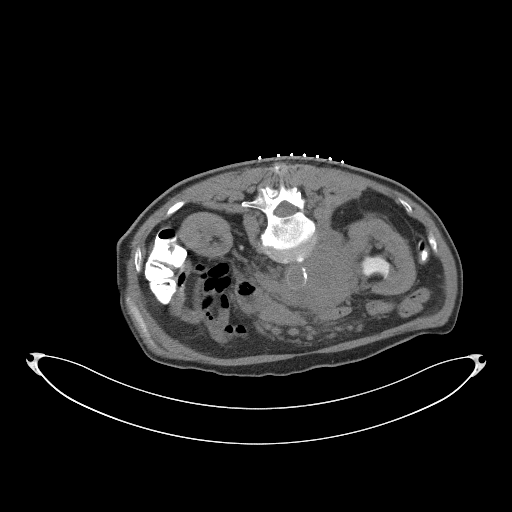
[im 30/53  soft-tissue]
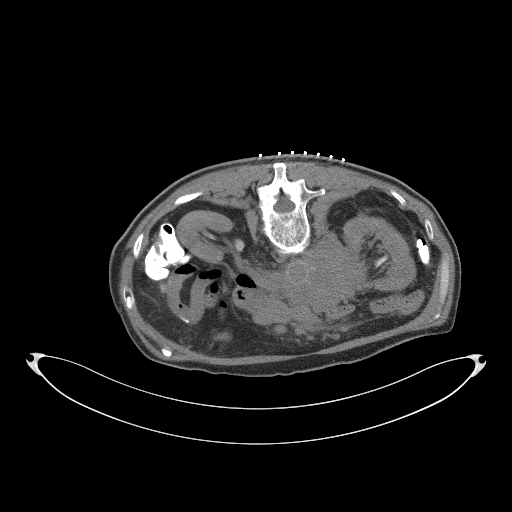
[im 33/53  soft-tissue]
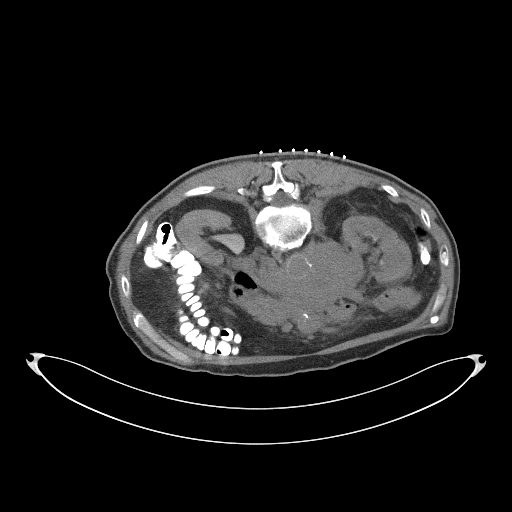
[im 33/53  bone]
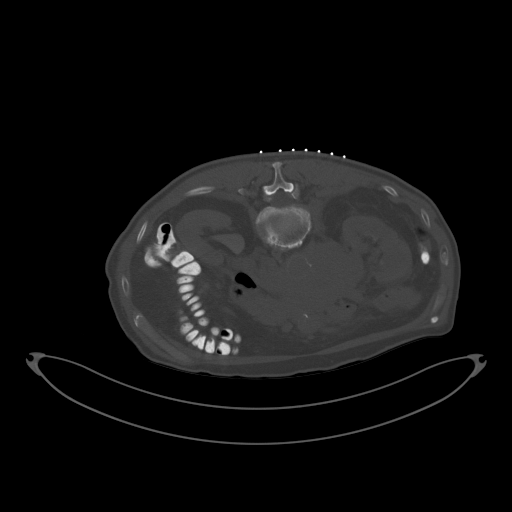
[im 36/53  soft-tissue]
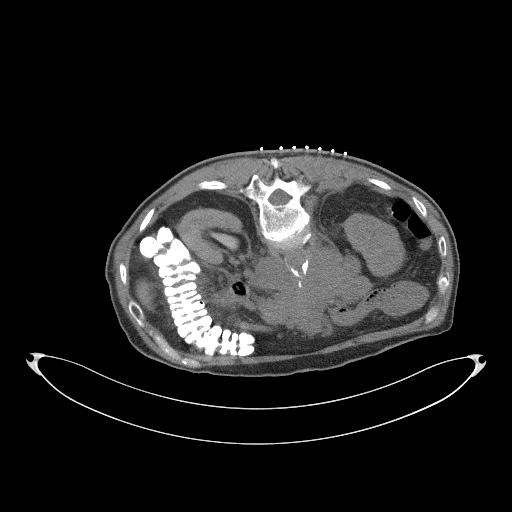
[im 40/53  lung]
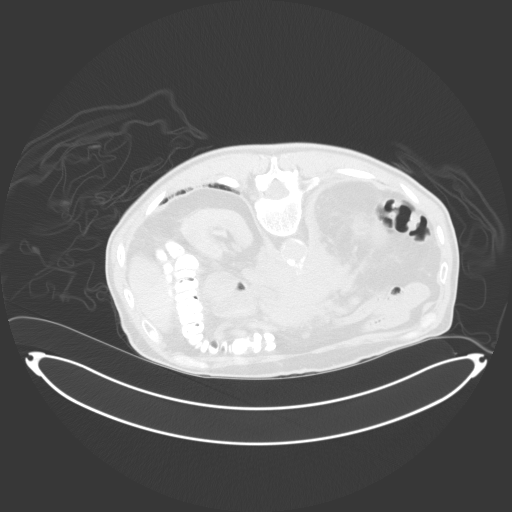
[im 43/53  soft-tissue]
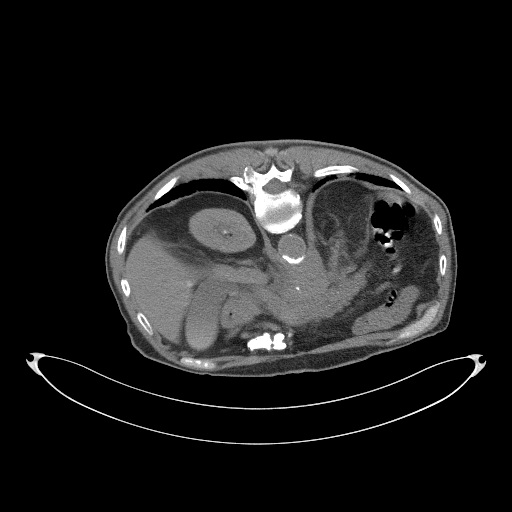
[im 43/53  lung]
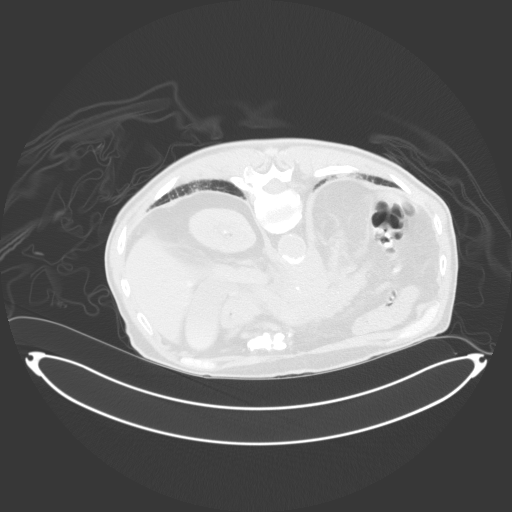
[im 46/53  soft-tissue]
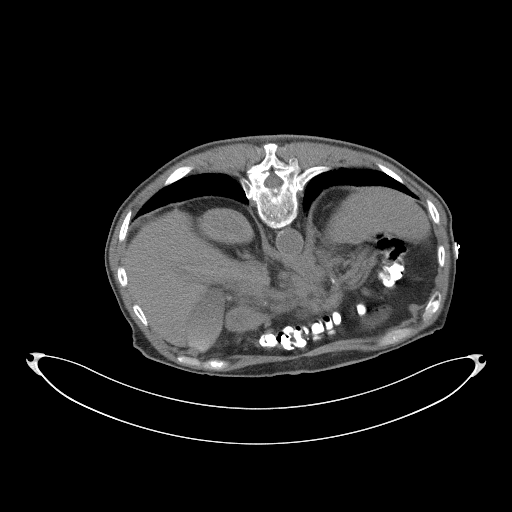
[im 46/53  lung]
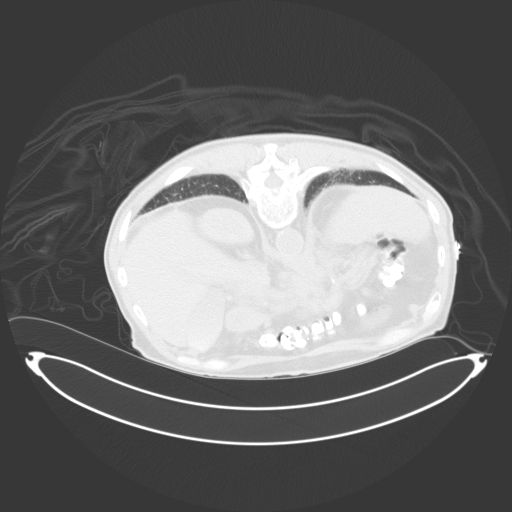
[im 49/53  soft-tissue]
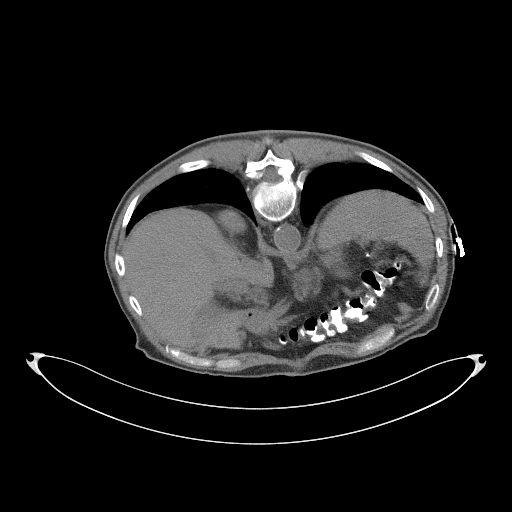
[im 49/53  lung]
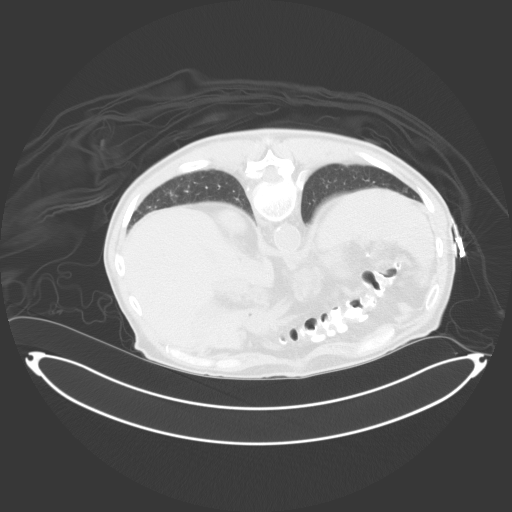

[14 of 32 positions shown; findings below may reference images not displayed]

MEDICATIONS:
None.

ANESTHESIA/SEDATION:
Fentanyl 50 mcg IV; Versed 1 mg IV

Sedation time: 11 minutes; The patient was continuously monitored
during the procedure by the interventional radiology nurse under my
direct supervision.

CONTRAST:  None.

COMPLICATIONS:
None immediate.

PROCEDURE:
Informed consent was obtained from the patient following an
explanation of the procedure, risks, benefits and alternatives. A
time out was performed prior to the initiation of the procedure.

The patient was positioned prone on the CT table and a limited CT
was performed for procedural planning demonstrating unchanged size
and appearance of the confluent retroperitoneal nodal mass. The
procedure was planned. The operative site was prepped and draped in
the usual sterile fashion. Appropriate trajectory was confirmed with
a 22 gauge spinal needle after the adjacent tissues were
anesthetized with 1% Lidocaine with epinephrine.

Under intermittent CT guidance, a 17 gauge coaxial needle was
advanced into the caudal posterior aspect of the retroperitoneal
nodal conglomeration with special attention paid to avoid the
expected location of the left-sided retroaortic renal vein.

Appropriate positioning was confirmed and 6 core needle biopsy
samples were obtained with an 18 gauge core needle biopsy device.
The co-axial needle was removed and hemostasis was achieved with
manual compression.

A limited postprocedural CT was negative for hemorrhage or
additional complication. A dressing was placed. The patient
tolerated the procedure well without immediate postprocedural
complication.
IMPRESSION: Technically successful CT guided core needle biopsy of
retroperitoneal nodal conglomeration.

## 2019-09-16 IMAGING — US IR PICC >5YO
1 series · 1 of 1 positions shown · non-contrast
Comparison: none

INDICATION: Patient with history of non-Hodgkin's lymphoma; central venous
access requested for chemotherapy.

EXAM:
ULTRASOUND AND FLUOROSCOPIC GUIDED RIGHT UPPER EXTREMITY PICC LINE
INSERTION
MEDICATIONS:
1% lidocaine to skin and subcutaneous tissue
ANESTHESIA/SEDATION:
None
FLUOROSCOPY TIME:  Fluoroscopy Time:  30 seconds (4 mGy).
COMPLICATIONS:
None immediate.
TECHNIQUE: The procedure, risks, benefits, and alternatives were explained to
the patient and informed written consent was obtained. A timeout was
performed prior to the initiation of the procedure.

[Series 1: ir fluoro/shunt/fist · 1 of 1 slices shown]
[im 1/1]
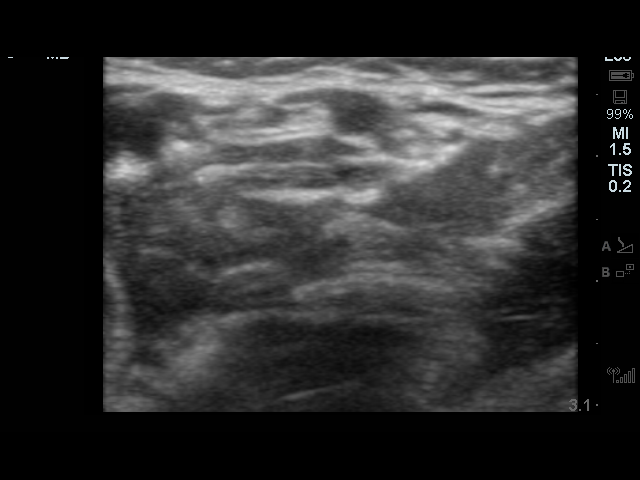

[1 of 1 positions shown; findings below may reference images not displayed]

The right upper extremity was prepped with chlorhexidine in a
sterile fashion, and a sterile drape was applied covering the
operative field. Maximum barrier sterile technique with sterile
gowns and gloves were used for the procedure. A timeout was
performed prior to the initiation of the procedure. Local anesthesia
was provided with 1% lidocaine.

Under direct ultrasound guidance, the right basilic vein was
accessed with a micropuncture kit after the overlying soft tissues
were anesthetized with 1% lidocaine. An ultrasound image was saved
for documentation purposes. A guidewire was advanced to the level of
the superior caval-atrial junction for measurement purposes and the
PICC line was cut to length. A peel-away sheath was placed and a 40
cm, 5 French, single lumen was inserted to level of the superior
caval-atrial junction. A post procedure spot fluoroscopic was
obtained. The catheter easily aspirated and flushed and was sutured
in place. A dressing was placed. The patient tolerated the procedure
well without immediate post procedural complication.
FINDINGS: After catheter placement, the tip lies within the superior
cavoatrial junction; the catheter aspirates and flushes normally and
is ready for immediate use.
IMPRESSION: Successful ultrasound and fluoroscopic guided placement of a right
basilic vein approach, 40 cm, 5 French, single lumen PICC with tip
at the superior caval-atrial junction. The PICC line is ready for
immediate use.
